# Patient Record
Sex: Female | Born: 1965 | Race: White | Hispanic: No | Smoking: Former smoker
Health system: Southern US, Community
[De-identification: ages and names within clinical notes are randomized; demographics above are authoritative.]

## PROBLEM LIST (undated history)

## (undated) ENCOUNTER — Emergency Department (HOSPITAL_COMMUNITY)

## (undated) DIAGNOSIS — E119 Type 2 diabetes mellitus without complications: Secondary | ICD-10-CM

## (undated) DIAGNOSIS — A6920 Lyme disease, unspecified: Secondary | ICD-10-CM

## (undated) DIAGNOSIS — I10 Essential (primary) hypertension: Secondary | ICD-10-CM

## (undated) DIAGNOSIS — J449 Chronic obstructive pulmonary disease, unspecified: Secondary | ICD-10-CM

## (undated) DIAGNOSIS — J45909 Unspecified asthma, uncomplicated: Secondary | ICD-10-CM

## (undated) DIAGNOSIS — E785 Hyperlipidemia, unspecified: Secondary | ICD-10-CM

## (undated) DIAGNOSIS — G510 Bell's palsy: Secondary | ICD-10-CM

## (undated) HISTORY — DX: Type 2 diabetes mellitus without complications: E11.9

## (undated) HISTORY — DX: Chronic obstructive pulmonary disease, unspecified: J44.9

---

## 2012-02-27 DIAGNOSIS — I10 Essential (primary) hypertension: Secondary | ICD-10-CM | POA: Insufficient documentation

## 2012-02-27 DIAGNOSIS — J45909 Unspecified asthma, uncomplicated: Secondary | ICD-10-CM | POA: Insufficient documentation

## 2012-02-27 DIAGNOSIS — E785 Hyperlipidemia, unspecified: Secondary | ICD-10-CM | POA: Insufficient documentation

## 2012-02-27 DIAGNOSIS — E782 Mixed hyperlipidemia: Secondary | ICD-10-CM | POA: Insufficient documentation

## 2012-02-27 DIAGNOSIS — E119 Type 2 diabetes mellitus without complications: Secondary | ICD-10-CM | POA: Insufficient documentation

## 2012-02-29 ENCOUNTER — Ambulatory Visit: Payer: Self-pay | Admitting: Internal Medicine

## 2016-01-26 ENCOUNTER — Other Ambulatory Visit: Payer: Self-pay | Admitting: Family Medicine

## 2016-01-28 NOTE — Telephone Encounter (Signed)
Number disconnected.

## 2016-02-15 ENCOUNTER — Other Ambulatory Visit: Payer: Self-pay | Admitting: Family Medicine

## 2016-02-15 NOTE — Telephone Encounter (Signed)
Tried contacting patient number disconnected.

## 2016-02-17 ENCOUNTER — Other Ambulatory Visit: Payer: Self-pay | Admitting: Family Medicine

## 2016-02-19 ENCOUNTER — Other Ambulatory Visit: Payer: Self-pay | Admitting: Family Medicine

## 2016-02-22 ENCOUNTER — Other Ambulatory Visit: Payer: Self-pay | Admitting: Family Medicine

## 2016-04-11 ENCOUNTER — Other Ambulatory Visit: Payer: Self-pay | Admitting: Family Medicine

## 2016-04-11 NOTE — Telephone Encounter (Signed)
Not our patient

## 2016-04-11 NOTE — Telephone Encounter (Signed)
Not one of our patient's, I'm not sure how to delete this entry.

## 2016-04-11 NOTE — Telephone Encounter (Signed)
Patient requesting refill. 

## 2016-05-09 ENCOUNTER — Other Ambulatory Visit: Payer: Self-pay | Admitting: Family Medicine

## 2016-05-09 NOTE — Telephone Encounter (Signed)
Patient requesting refill. 

## 2016-05-10 NOTE — Telephone Encounter (Signed)
Not a patient.

## 2017-06-11 ENCOUNTER — Other Ambulatory Visit: Payer: Self-pay | Admitting: Family Medicine

## 2017-06-11 DIAGNOSIS — N6311 Unspecified lump in the right breast, upper outer quadrant: Secondary | ICD-10-CM

## 2017-12-24 ENCOUNTER — Other Ambulatory Visit: Payer: Self-pay | Admitting: Family Medicine

## 2017-12-24 DIAGNOSIS — N6311 Unspecified lump in the right breast, upper outer quadrant: Secondary | ICD-10-CM

## 2018-03-13 ENCOUNTER — Emergency Department
Admission: EM | Admit: 2018-03-13 | Discharge: 2018-03-13 | Disposition: A | Discharge status: Home / Self Care | Payer: Medicaid Other | Attending: Student in an Organized Health Care Education/Training Program | Admitting: Student in an Organized Health Care Education/Training Program

## 2018-03-13 DIAGNOSIS — T23292A Burn of second degree of multiple sites of left wrist and hand, initial encounter: Secondary | ICD-10-CM | POA: Diagnosis not present

## 2018-03-13 DIAGNOSIS — Y999 Unspecified external cause status: Secondary | ICD-10-CM | POA: Diagnosis not present

## 2018-03-13 DIAGNOSIS — T23092A Burn of unspecified degree of multiple sites of left wrist and hand, initial encounter: Secondary | ICD-10-CM | POA: Diagnosis present

## 2018-03-13 DIAGNOSIS — E119 Type 2 diabetes mellitus without complications: Secondary | ICD-10-CM | POA: Insufficient documentation

## 2018-03-13 DIAGNOSIS — Z23 Encounter for immunization: Secondary | ICD-10-CM | POA: Insufficient documentation

## 2018-03-13 DIAGNOSIS — I10 Essential (primary) hypertension: Secondary | ICD-10-CM | POA: Diagnosis not present

## 2018-03-13 DIAGNOSIS — Y92000 Kitchen of unspecified non-institutional (private) residence as  the place of occurrence of the external cause: Secondary | ICD-10-CM | POA: Insufficient documentation

## 2018-03-13 DIAGNOSIS — Y93G3 Activity, cooking and baking: Secondary | ICD-10-CM | POA: Insufficient documentation

## 2018-03-13 DIAGNOSIS — Z7984 Long term (current) use of oral hypoglycemic drugs: Secondary | ICD-10-CM | POA: Diagnosis not present

## 2018-03-13 DIAGNOSIS — X102XXA Contact with fats and cooking oils, initial encounter: Secondary | ICD-10-CM | POA: Insufficient documentation

## 2018-03-13 DIAGNOSIS — J45909 Unspecified asthma, uncomplicated: Secondary | ICD-10-CM | POA: Diagnosis not present

## 2018-03-13 HISTORY — DX: Essential (primary) hypertension: I10

## 2018-03-13 HISTORY — DX: Unspecified asthma, uncomplicated: J45.909

## 2018-03-13 HISTORY — DX: Type 2 diabetes mellitus without complications: E11.9

## 2018-03-13 HISTORY — DX: Hyperlipidemia, unspecified: E78.5

## 2018-03-13 MED ORDER — HYDROCODONE-ACETAMINOPHEN 5-325 MG PO TABS
1.0000 | ORAL_TABLET | Freq: Once | ORAL | Status: AC
  Administered 2018-03-13: 1 via ORAL
  Filled 2018-03-13: qty 1

## 2018-03-13 MED ORDER — BACITRACIN ZINC 500 UNIT/GM EX OINT
TOPICAL_OINTMENT | CUTANEOUS | Status: AC
  Filled 2018-03-13: qty 3.6

## 2018-03-13 MED ORDER — BACITRACIN ZINC 500 UNIT/GM EX OINT
TOPICAL_OINTMENT | Freq: Once | CUTANEOUS | Status: AC
  Administered 2018-03-13: 2 via TOPICAL
  Filled 2018-03-13: qty 0.9

## 2018-03-13 MED ORDER — MORPHINE SULFATE (PF) 4 MG/ML IV SOLN: 4.0000 mg | INTRAVENOUS | Status: DC | PRN

## 2018-03-13 MED ORDER — HYDROCODONE-ACETAMINOPHEN 5-325 MG PO TABS: 1.0000 | ORAL_TABLET | ORAL | 0 refills | Status: DC | PRN

## 2018-03-13 MED ORDER — BACITRACIN ZINC 500 UNIT/GM EX OINT
TOPICAL_OINTMENT | Freq: Once | CUTANEOUS | Status: AC
  Administered 2018-03-13: 1 via TOPICAL
  Filled 2018-03-13: qty 0.9

## 2018-03-13 MED ORDER — TETANUS-DIPHTH-ACELL PERTUSSIS 5-2.5-18.5 LF-MCG/0.5 IM SUSP
0.5000 mL | Freq: Once | INTRAMUSCULAR | Status: AC
  Administered 2018-03-13: 0.5 mL via INTRAMUSCULAR
  Filled 2018-03-13: qty 0.5

## 2018-03-13 MED ORDER — ONDANSETRON HCL 4 MG/2ML IJ SOLN
4.0000 mg | Freq: Once | INTRAMUSCULAR | Status: DC
Start: 2018-03-13 — End: 2018-03-14

## 2018-03-13 MED ORDER — BACITRACIN ZINC 500 UNIT/GM EX OINT
TOPICAL_OINTMENT | Freq: Once | CUTANEOUS | Status: AC
Start: 2018-03-13 — End: 2018-03-13
  Administered 2018-03-13: 1 via TOPICAL
  Filled 2018-03-13: qty 0.9
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
## 2018-03-13 NOTE — ED Notes (Signed)
Called [facility] to inform them per Dr. Merlyn Lot patient did not want to be transferred to [facility] ED  2305
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
## 2018-03-14 DIAGNOSIS — T23292A Burn of second degree of multiple sites of left wrist and hand, initial encounter: Secondary | ICD-10-CM | POA: Insufficient documentation

## 2018-06-19 ENCOUNTER — Emergency Department (HOSPITAL_COMMUNITY)
Admission: EM | Admit: 2018-06-19 | Discharge: 2018-06-19 | Disposition: A | Discharge status: Home / Self Care | Payer: Medicaid Other | Attending: Emergency Medicine | Admitting: Emergency Medicine

## 2018-06-19 ENCOUNTER — Other Ambulatory Visit: Payer: Self-pay

## 2018-06-19 ENCOUNTER — Encounter (HOSPITAL_COMMUNITY): Payer: Self-pay | Admitting: Emergency Medicine

## 2018-06-19 ENCOUNTER — Emergency Department (HOSPITAL_COMMUNITY): Payer: Medicaid Other

## 2018-06-19 DIAGNOSIS — L0291 Cutaneous abscess, unspecified: Secondary | ICD-10-CM

## 2018-06-19 DIAGNOSIS — E1165 Type 2 diabetes mellitus with hyperglycemia: Secondary | ICD-10-CM | POA: Diagnosis not present

## 2018-06-19 DIAGNOSIS — Z794 Long term (current) use of insulin: Secondary | ICD-10-CM | POA: Diagnosis not present

## 2018-06-19 DIAGNOSIS — I10 Essential (primary) hypertension: Secondary | ICD-10-CM | POA: Insufficient documentation

## 2018-06-19 DIAGNOSIS — J45909 Unspecified asthma, uncomplicated: Secondary | ICD-10-CM | POA: Diagnosis not present

## 2018-06-19 DIAGNOSIS — R739 Hyperglycemia, unspecified: Secondary | ICD-10-CM

## 2018-06-19 DIAGNOSIS — F17210 Nicotine dependence, cigarettes, uncomplicated: Secondary | ICD-10-CM | POA: Diagnosis not present

## 2018-06-19 DIAGNOSIS — Z79899 Other long term (current) drug therapy: Secondary | ICD-10-CM | POA: Insufficient documentation

## 2018-06-19 DIAGNOSIS — N611 Abscess of the breast and nipple: Secondary | ICD-10-CM

## 2018-06-19 LAB — BASIC METABOLIC PANEL
Anion gap: 9 (ref 5–15)
BUN: 14 mg/dL (ref 6–20)
CO2: 25 mmol/L (ref 22–32)
CREATININE: 0.69 mg/dL (ref 0.44–1.00)
Calcium: 9.3 mg/dL (ref 8.9–10.3)
Chloride: 95 mmol/L — ABNORMAL LOW (ref 98–111)
GFR calc non Af Amer: >60 mL/min (ref >60)
GLUCOSE: 580 mg/dL — AB (ref 70–99)
Potassium: 4.4 mmol/L (ref 3.5–5.1)
Sodium: 129 mmol/L — ABNORMAL LOW (ref 135–145)

## 2018-06-19 LAB — CBC WITH DIFFERENTIAL/PLATELET
BASOS PCT: 0 %
Basophils Absolute: 0 10*3/uL (ref 0.0–0.1)
Eosinophils Absolute: 0.1 10*3/uL (ref 0.0–0.7)
Eosinophils Relative: 2 %
HEMATOCRIT: 50 % — AB (ref 36.0–46.0)
HEMOGLOBIN: 16.8 g/dL — AB (ref 12.0–15.0)
LYMPHS ABS: 1.2 10*3/uL (ref 0.7–4.0)
LYMPHS PCT: 17 %
MCH: 28.4 pg (ref 26.0–34.0)
MCHC: 33.6 g/dL (ref 30.0–36.0)
MCV: 84.6 fL (ref 78.0–100.0)
MONO ABS: 0.6 10*3/uL (ref 0.1–1.0)
MONOS PCT: 8 %
NEUTROS ABS: 4.9 10*3/uL (ref 1.7–7.7)
NEUTROS PCT: 73 %
Platelets: 159 10*3/uL (ref 150–400)
RBC: 5.91 MIL/uL — ABNORMAL HIGH (ref 3.87–5.11)
RDW: 14.6 % (ref 11.5–15.5)
WBC: 6.8 10*3/uL (ref 4.0–10.5)

## 2018-06-19 LAB — CBG MONITORING, ED: GLUCOSE-CAPILLARY: 432 mg/dL — AB (ref 70–99)

## 2018-06-19 MED ORDER — HYDROCODONE-ACETAMINOPHEN 5-325 MG PO TABS: ORAL_TABLET | ORAL | 0 refills | Status: DC

## 2018-06-19 MED ORDER — SULFAMETHOXAZOLE-TRIMETHOPRIM 800-160 MG PO TABS: 1.0000 | ORAL_TABLET | Freq: Two times a day (BID) | ORAL | 0 refills | Status: AC

## 2018-06-19 MED ORDER — INSULIN ASPART 100 UNIT/ML ~~LOC~~ SOLN
10.0000 [IU] | Freq: Once | SUBCUTANEOUS | Status: AC
  Administered 2018-06-19: 10 [IU] via SUBCUTANEOUS
  Filled 2018-06-19: qty 1

## 2018-06-19 MED ORDER — SODIUM CHLORIDE 0.9 % IV BOLUS
1000.0000 mL | Freq: Once | INTRAVENOUS | Status: AC
Start: 2018-06-19 — End: 2018-06-19
  Administered 2018-06-19: 1000 mL via INTRAVENOUS

## 2018-06-19 MED ORDER — HYDROCODONE-ACETAMINOPHEN 5-325 MG PO TABS
1.0000 | ORAL_TABLET | Freq: Once | ORAL | Status: AC
  Administered 2018-06-19: 1 via ORAL
  Filled 2018-06-19: qty 1

## 2018-06-19 NOTE — ED Provider Notes (Signed)
[facility] EMERGENCY DEPARTMENT Provider Note   CSN: [id number] Arrival date & time: 06/19/18  0944     History   Chief Complaint Chief Complaint  Patient presents with  . Abscess    HPI Christine Weaver is a 52 y.o. female.  HPI   Christine Weaver is a 52 y.o. female with past medical history of diabetes, hypertension who presents to the Emergency Department complaining of pain, redness, and swelling of the right breast.  Symptoms have been worsening for 3 days.  She describes constant throbbing of her breast with severe pain upon palpation.  She denies injury, nipple discharge, fever, chills.  No known family history of breast cancer.  States that she had a mammogram some years ago, but her medical records were lost.  Patient is a smoker.  Past Medical History:  Diagnosis Date  . Asthma   . Diabetes mellitus without complication ([place])   . Hyperlipidemia   . Hypertension     There are no active problems to display for this patient.   Past Surgical History:  Procedure Laterality Date  . CESAREAN SECTION       OB History   None      Home Medications    Prior to Admission medications   Medication Sig Start Date End Date Taking? Authorizing Provider  albuterol (PROVENTIL HFA;VENTOLIN HFA) 108 (90 Base) MCG/ACT inhaler Inhale into the lungs every 4 (four) hours as needed for wheezing.  03/18/12  Yes [provider]  albuterol (PROVENTIL) (2.5 MG/3ML) 0.083% nebulizer solution Inhale contents fo 1 vial in nebulizer every 4 hours if needed 10/25/16  Yes [provider]  amLODipine (NORVASC) 10 MG tablet Take 10 mg by mouth at bedtime.  05/31/17  Yes [provider]  atorvastatin (LIPITOR) 40 MG tablet take 1 tablet by mouth every evening for cholesterol 05/31/17  Yes [provider]  fluticasone-salmeterol (ADVAIR HFA) 115-21 MCG/ACT inhaler Inhale 2 puffs into the lungs 2 (two) times daily as needed (wheezing).  03/18/12  Yes [provider]  hydrOXYzine (ATARAX/VISTARIL) 25 MG tablet Take 25 mg by mouth every 4 (four) hours as needed for anxiety.  11/03/17  Yes [provider]  ibuprofen (ADVIL,MOTRIN) 800 MG tablet Take 800 mg by mouth 3 (three) times daily.  03/18/12  Yes [provider]  insulin aspart (NOVOLOG FLEXPEN) 100 UNIT/ML FlexPen As directed up to 60 units per day 05/31/17  Yes [provider]  Insulin Glargine (LANTUS SOLOSTAR) 100 UNIT/ML Solostar Pen Inject 10-20 Units into the skin daily. Inject 10 units in the morning and 10 units in the afternoon and 20 units at bedtime 05/31/17  Yes [provider]  liraglutide (VICTOZA) 18 MG/3ML SOPN Inject 1.8 mg into the skin daily.  05/31/17  Yes [provider]  lisinopril (PRINIVIL,ZESTRIL) 10 MG tablet take 1 tablet by mouth once daily for blood pressure 05/31/17  Yes [provider]  metFORMIN (GLUCOPHAGE) 1000 MG tablet Take 1,000 mg by mouth 2 (two) times daily with a meal.  10/27/12  Yes [provider]  omeprazole (PRILOSEC) 40 MG capsule Take by mouth. 03/18/12  Yes [provider]  SSD 1 % cream Apply 1 application topically daily. For rash 04/10/18  Yes [provider]  HYDROcodone-acetaminophen (NORCO) 5-325 MG tablet Take 1 tablet by mouth every 4 (four) hours as needed for moderate pain. Patient not taking: Reported on 06/19/2018 03/13/18   Merlyn Lot, MD    Family History History reviewed. No  pertinent family history.  Social History Social History   Tobacco Use  . Smoking status: Current Every Day Smoker  . Smokeless tobacco: Never Used  Substance Use Topics  . Alcohol use: Not on file  . Drug use: Not on file     Allergies   Morphine and related and Percocet [oxycodone-acetaminophen]   Review of Systems Review of Systems  Constitutional: Negative for chills and fever.  Respiratory: Negative for chest tightness and shortness of breath.   Cardiovascular:  Negative for leg swelling.  Gastrointestinal: Negative for abdominal pain, nausea and vomiting.  Genitourinary: Negative for vaginal bleeding and vaginal discharge.       Right breast pain, redness, and swelling  Musculoskeletal: Negative for arthralgias, joint swelling and neck pain.  Skin: Positive for color change.  Neurological: Negative for weakness, numbness and headaches.  Hematological: Negative for adenopathy.  All other systems reviewed and are negative.    Physical Exam Updated Vital Signs BP (!) 152/96 (BP Location: Right Arm)   Pulse 83   Temp 98.3 F (36.8 C) (Oral)   Resp 19   SpO2 96%   Physical Exam  Constitutional: She appears well-developed and well-nourished. No distress.  HENT:  Head: Atraumatic.  Mouth/Throat: Oropharynx is clear and moist.  Neck: Normal range of motion.  Cardiovascular: Normal rate, regular rhythm and intact distal pulses.  Pulmonary/Chest: Effort normal and breath sounds normal. No respiratory distress. Right breast exhibits inverted nipple, skin change and tenderness. Right breast exhibits no nipple discharge. There is breast swelling. No breast discharge or bleeding.  Significant induration, erythema, and tenderness of the right breast.  Symptoms involve the areola and extending laterally to the 9 o'clock position.  Right nipple is inverted.  No discharge.  Musculoskeletal: Normal range of motion.  Lymphadenopathy:    She has no cervical adenopathy.  Neurological: She is alert. No sensory deficit.  Skin: Skin is warm. Capillary refill takes less than 2 seconds.  Nursing note and vitals reviewed.    ED Treatments / Results  Labs (all labs ordered are listed, but only abnormal results are displayed) Labs Reviewed  CBC WITH DIFFERENTIAL/PLATELET - Abnormal; Notable for the following components:      Result Value   RBC 5.91 (*)    Hemoglobin 16.8 (*)    HCT 50.0 (*)    All other components within normal limits  BASIC METABOLIC  PANEL - Abnormal; Notable for the following components:   Sodium 129 (*)    Chloride 95 (*)    Glucose, Bld 580 (*)    All other components within normal limits  CBG MONITORING, ED - Abnormal; Notable for the following components:   Glucose-Capillary 432 (*)    All other components within normal limits  CULTURE, BLOOD (ROUTINE X 2)  CULTURE, BLOOD (ROUTINE X 2)    EKG None  Radiology US Breast Ltd Uni Right Inc Axilla  Result Date: 06/19/2018 CLINICAL DATA:  Right breast erythema and swelling 3 days with tenderness and area of firmness round nipple. Exam was performed on an emergent basis through the ER by the ultrasound technologist without radiologist present. EXAM: ULTRASOUND OF THE RIGHT BREAST COMPARISON:  Previous exam(s). FINDINGS: Targeted ultrasound is performed in the area of patient's erythema and swelling, showing a large hypoechoic heterogeneous probable complex fluid collection within increased through transmission at the 9 o'clock position of the right breast 3 cm from the nipple. This measures approximately 3.7 x 6.7 x 6.7 cm and likely represents an abscess.  There is overlying skin thickening. IMPRESSION: Heterogeneous hypoechoic mass with through transmission over the 9 o'clock position of the right breast 3 cm from the nipple measuring 3.7 x 6.7 x 6.7 cm. In keeping with patient's clinical presentation, this likely represents an abscess. RECOMMENDATION: Recommend immediate aspiration with 10 day course of antibiotics with doxycycline. Recommend follow-up ultrasound 1 week. Also recommend mammographic correlation when feasible. I have discussed the findings and recommendations with the patient. Results were also provided in writing at the conclusion of the visit. If applicable, a reminder letter will be sent to the patient regarding the next appointment. BI-RADS CATEGORY  3: Probably benign. Electronically Signed   By: Marin Olp M.D.   On: 06/19/2018 12:33     Procedures Procedures (including critical care time)  Medications Ordered in ED Medications  HYDROcodone-acetaminophen (NORCO/VICODIN) 5-325 MG per tablet 1 tablet (1 tablet Oral Given 06/19/18 1120)  sodium chloride 0.9 % bolus 1,000 mL (1,000 mLs Intravenous New Bag/Given 06/19/18 1227)  insulin aspart (novoLOG) injection 10 Units (10 Units Subcutaneous Given 06/19/18 1227)     Initial Impression / Assessment and Plan / ED Course  I have reviewed the triage vital signs and the nursing notes.  Pertinent labs & imaging results that were available during my care of the patient were reviewed by me and considered in my medical decision making (see chart for details).    Patient is nontoxic-appearing.  CBG of 580.  Patient states she did not take her diabetes medication prior to arriving.  Her clinical symptoms of DKA.  Anion gap is within normal limits.  Will administer insulin and IV fluids.  Discussed ultrasound results with patient.  She states that she is ready for discharge home.   Consulted general surgery, Dr. Arnoldo Morale and discussed findings. He  Agrees to see pt in his office, recommends Bactrim.    Patient agrees to plan, will take medication for her diabetes when she arrives home.  Return precautions discussed.  Final Clinical Impressions(s) / ED Diagnoses   Final diagnoses:  Abscess  Abscess of right breast  Hyperglycemia    ED Discharge Orders    None       Kem Parkinson, PA-C 06/21/18 1744    Fredia Sorrow, MD 06/30/18 [phone]

## 2018-06-19 NOTE — Discharge Instructions (Addendum)
Take the antibiotic as directed until finished.  Apply warm wet compresses on and off to your breast.  Call Dr. Arnoldo Morale office tomorrow to arrange a follow-up appointment.  Be sure to take your diabetes medications daily as directed and avoid sweets.

## 2018-06-19 NOTE — ED Triage Notes (Signed)
Pt noticed redness and swelling to right breast 3 days ago. Over half of breast around nippled is hard, red and swollen. Very tender to touch. Denies dc from nipple. Denies fever/chills

## 2018-06-19 NOTE — ED Notes (Signed)
CRITICAL VALUE ALERT  Critical Value:  Glucose 580  Date & Time Notied:  1141, 06/19/18  Provider Notified: Kem Parkinson, PA  Orders Received/Actions taken: no new orders at this time

## 2018-06-19 NOTE — ED Notes (Signed)
Pt still in ultrasound.

## 2018-06-24 LAB — CULTURE, BLOOD (ROUTINE X 2)
Culture: NO GROWTH
Culture: NO GROWTH
SPECIAL REQUESTS: ADEQUATE
SPECIAL REQUESTS: ADEQUATE

## 2019-09-23 LAB — BASIC METABOLIC PANEL
BUN: 15 (ref 4–21)
Creatinine: 0.7 (ref 0.5–1.1)

## 2019-09-23 LAB — LIPID PANEL
Cholesterol: 206 — AB (ref 0–200)
HDL: 44 (ref 35–70)
LDL Cholesterol: 108
Triglycerides: 269 — AB (ref 40–160)

## 2019-09-23 LAB — TSH: TSH: 1.42 (ref 0.41–5.90)

## 2019-09-23 LAB — HEMOGLOBIN A1C: Hemoglobin A1C: 11.2

## 2019-12-15 ENCOUNTER — Encounter: Payer: Self-pay | Admitting: "Endocrinology

## 2019-12-15 ENCOUNTER — Ambulatory Visit (INDEPENDENT_AMBULATORY_CARE_PROVIDER_SITE_OTHER): Payer: Medicaid Other | Admitting: "Endocrinology

## 2019-12-15 ENCOUNTER — Other Ambulatory Visit: Payer: Self-pay

## 2019-12-15 VITALS — BP 168/95 | HR 92 | Ht 62.0 in | Wt 220.6 lb

## 2019-12-15 DIAGNOSIS — I10 Essential (primary) hypertension: Secondary | ICD-10-CM

## 2019-12-15 DIAGNOSIS — Z794 Long term (current) use of insulin: Secondary | ICD-10-CM

## 2019-12-15 DIAGNOSIS — F17200 Nicotine dependence, unspecified, uncomplicated: Secondary | ICD-10-CM

## 2019-12-15 DIAGNOSIS — E1165 Type 2 diabetes mellitus with hyperglycemia: Secondary | ICD-10-CM

## 2019-12-15 DIAGNOSIS — E782 Mixed hyperlipidemia: Secondary | ICD-10-CM | POA: Diagnosis not present

## 2019-12-15 LAB — POCT GLYCOSYLATED HEMOGLOBIN (HGB A1C): Hemoglobin A1C: 10.3 % — AB (ref 4.0–5.6)

## 2019-12-15 NOTE — Progress Notes (Signed)
Endocrinology Consult Note       12/15/2019, 11:09 AM   Subjective:    Patient ID: Christine Weaver, female    DOB: 12/17/1965.  Charlott Dhanraj is being seen in consultation for management of currently uncontrolled symptomatic diabetes requested by  Martin Majestic, FNP.   Past Medical History:  Diagnosis Date  . Asthma   . Diabetes mellitus without complication ([place])   . Hyperlipidemia   . Hypertension     Past Surgical History:  Procedure Laterality Date  . CESAREAN SECTION      Social History   Socioeconomic History  . Marital status: Married    Spouse name: Not on file  . Number of children: Not on file  . Years of education: Not on file  . Highest education level: Not on file  Occupational History  . Not on file  Tobacco Use  . Smoking status: Current Every Day Smoker  . Smokeless tobacco: Never Used  Substance and Sexual Activity  . Alcohol use: Not on file  . Drug use: Not on file  . Sexual activity: Not on file  Other Topics Concern  . Not on file  Social History Narrative  . Not on file   Social Determinants of Health   Financial Resource Strain:   . Difficulty of Paying Living Expenses: Not on file  Food Insecurity:   . Worried About Charity fundraiser in the Last Year: Not on file  . Ran Out of Food in the Last Year: Not on file  Transportation Needs:   . Lack of Transportation (Medical): Not on file  . Lack of Transportation (Non-Medical): Not on file  Physical Activity:   . Days of Exercise per Week: Not on file  . Minutes of Exercise per Session: Not on file  Stress:   . Feeling of Stress : Not on file  Social Connections:   . Frequency of Communication with Friends and Family: Not on file  . Frequency of Social Gatherings with Friends and Family: Not on file  . Attends Religious Services: Not on file  . Active Member of Clubs or Organizations: Not on file   . Attends Archivist Meetings: Not on file  . Marital Status: Not on file    Family History  Problem Relation Age of Onset  . Thyroid disease Mother   . Hypertension Mother   . Heart attack Mother   . Stroke Mother   . Heart failure Mother   . Diabetes Mother   . Diabetes Father   . Heart attack Father   . Stroke Father   . Kidney disease Father   . Heart failure Father     Outpatient Encounter Medications as of 12/15/2019  Medication Sig  . atorvastatin (LIPITOR) 80 MG tablet Take 80 mg by mouth daily.  Marland Kitchen glucose blood (ACCU-CHEK AVIVA PLUS) test strip TEST BLOOD SUGAR ONE TO TWO TIMES A DAY as directed  . insulin aspart (NOVOLOG) 100 UNIT/ML injection Inject 10-16 Units into the skin 3 (three) times daily before meals.    . insulin glargine (LANTUS) 100 UNIT/ML injection Inject 80 Units into  the skin at bedtime.  . Insulin Pen Needle (BD PEN NEEDLE NANO U/F) 32G X 4 MM MISC ok to sub any brand or size needle preferred by insurance/patient, use up to 5x/day, dx=associated dx  . lisinopril (ZESTRIL) 20 MG tablet Take 20 mg by mouth daily.  Marland Kitchen albuterol (PROVENTIL HFA;VENTOLIN HFA) 108 (90 Base) MCG/ACT inhaler Inhale into the lungs every 4 (four) hours as needed for wheezing.   Marland Kitchen albuterol (PROVENTIL) (2.5 MG/3ML) 0.083% nebulizer solution Inhale contents fo 1 vial in nebulizer every 4 hours if needed  . fluticasone-salmeterol (ADVAIR HFA) 115-21 MCG/ACT inhaler Inhale 2 puffs into the lungs 2 (two) times daily as needed (wheezing).   Marland Kitchen HYDROcodone-acetaminophen (NORCO/VICODIN) 5-325 MG tablet Take one tab po q 4 hrs prn pain  . hydrOXYzine (ATARAX/VISTARIL) 25 MG tablet Take 25 mg by mouth every 4 (four) hours as needed for anxiety.   Marland Kitchen ibuprofen (ADVIL,MOTRIN) 800 MG tablet Take 800 mg by mouth 3 (three) times daily.   Marland Kitchen liraglutide (VICTOZA) 18 MG/3ML SOPN Inject 1.8 mg into the skin daily.   . metFORMIN (GLUCOPHAGE) 1000 MG tablet Take 1,000 mg by mouth 2 (two) times  daily with a meal.   . omeprazole (PRILOSEC) 40 MG capsule Take by mouth.  . SSD 1 % cream Apply 1 application topically daily. For rash  . [DISCONTINUED] amLODipine (NORVASC) 10 MG tablet Take 10 mg by mouth at bedtime.   . [DISCONTINUED] atorvastatin (LIPITOR) 40 MG tablet take 1 tablet by mouth every evening for cholesterol  . [DISCONTINUED] insulin aspart (NOVOLOG FLEXPEN) 100 UNIT/ML FlexPen As directed up to 60 units per day  . [DISCONTINUED] Insulin Glargine (LANTUS SOLOSTAR) 100 UNIT/ML Solostar Pen Inject 10-20 Units into the skin daily. Inject 10 units in the morning and 10 units in the afternoon and 20 units at bedtime  . [DISCONTINUED] lisinopril (PRINIVIL,ZESTRIL) 10 MG tablet take 1 tablet by mouth once daily for blood pressure   No facility-administered encounter medications on file as of 12/15/2019.    ALLERGIES: Allergies  Allergen Reactions  . Morphine And Related   . Percocet [Oxycodone-Acetaminophen]     VACCINATION STATUS: Immunization History  Administered Date(s) Administered  . Tdap 03/13/2018    Diabetes She presents for her initial diabetic visit. She has type 2 diabetes mellitus. Onset time: She was diagnosed at approximately age of 43 years. Her disease course has been fluctuating. There are no hypoglycemic associated symptoms. Pertinent negatives for hypoglycemia include no confusion, headaches, pallor or seizures. Associated symptoms include fatigue, polydipsia and polyuria. Pertinent negatives for diabetes include no chest pain and no polyphagia. There are no hypoglycemic complications. Symptoms are worsening. Risk factors for coronary artery disease include dyslipidemia, diabetes mellitus, hypertension, obesity, sedentary lifestyle and tobacco exposure. Current diabetic treatment includes insulin injections. Her weight is fluctuating minimally. She is following a generally unhealthy diet. When asked about meal planning, she reported none. She has not had a  previous visit with a dietitian. She never participates in exercise. (She did not bring any logs nor meter to review today.  Her point-of-care A1c was found to be 10.3%.) An ACE inhibitor/angiotensin II receptor blocker is being taken. Eye exam is current.  Hyperlipidemia This is a chronic problem. The problem is uncontrolled. Recent lipid tests were reviewed and are high. Exacerbating diseases include diabetes and obesity. Pertinent negatives include no chest pain, myalgias or shortness of breath. Current antihyperlipidemic treatment includes statins. Risk factors for coronary artery disease include dyslipidemia, diabetes mellitus, family history,  hypertension, obesity and a sedentary lifestyle.  Hypertension This is a chronic problem. The current episode started more than 1 year ago. The problem is uncontrolled. Pertinent negatives include no chest pain, headaches, palpitations or shortness of breath. Risk factors for coronary artery disease include diabetes mellitus, dyslipidemia, obesity, sedentary lifestyle, smoking/tobacco exposure and family history. Past treatments include ACE inhibitors.     Review of Systems  Constitutional: Positive for fatigue. Negative for chills, fever and unexpected weight change.  HENT: Negative for trouble swallowing and voice change.   Eyes: Negative for visual disturbance.  Respiratory: Negative for cough, shortness of breath and wheezing.   Cardiovascular: Negative for chest pain, palpitations and leg swelling.  Gastrointestinal: Negative for diarrhea, nausea and vomiting.  Endocrine: Positive for polydipsia and polyuria. Negative for cold intolerance, heat intolerance and polyphagia.  Musculoskeletal: Negative for arthralgias and myalgias.  Skin: Negative for color change, pallor, rash and wound.  Neurological: Negative for seizures and headaches.  Psychiatric/Behavioral: Negative for confusion and suicidal ideas.    Objective:    Vitals with BMI  12/15/2019 06/19/2018 06/19/2018  Height 5\' 2"  - -  Weight 220 lbs 10 oz - -  BMI 123456 - -  Systolic [id number]  Diastolic 95 96 85  Pulse 92 83 91    BP (!) 168/95   Pulse 92   Ht 5\' 2"  (1.575 m)   Wt 220 lb 9.6 oz (100.1 kg)   BMI 40.35 kg/m   Wt Readings from Last 3 Encounters:  12/15/19 220 lb 9.6 oz (100.1 kg)  03/13/18 225 lb (102.1 kg)     Physical Exam Constitutional:      Appearance: She is well-developed.  HENT:     Head: Normocephalic and atraumatic.  Neck:     Thyroid: No thyromegaly.     Trachea: No tracheal deviation.  Cardiovascular:     Rate and Rhythm: Normal rate and regular rhythm.  Pulmonary:     Effort: Pulmonary effort is normal.  Abdominal:     Tenderness: There is no abdominal tenderness. There is no guarding.  Musculoskeletal:        General: Normal range of motion.     Cervical back: Normal range of motion and neck supple.  Skin:    General: Skin is warm and dry.     Coloration: Skin is not pale.     Findings: No erythema or rash.  Neurological:     Mental Status: She is alert and oriented to person, place, and time.     Cranial Nerves: No cranial nerve deficit.     Coordination: Coordination normal.     Deep Tendon Reflexes: Reflexes are normal and symmetric.  Psychiatric:        Judgment: Judgment normal.       CMP ( most recent) CMP     Component Value Date/Time   NA 129 (L) 06/19/2018 1109   K 4.4 06/19/2018 1109   CL 95 (L) 06/19/2018 1109   CO2 25 06/19/2018 1109   GLUCOSE 580 (HH) 06/19/2018 1109   BUN 15 09/23/2019 0000   CREATININE 0.7 09/23/2019 0000   CREATININE 0.69 06/19/2018 1109   CALCIUM 9.3 06/19/2018 1109   GFRNONAA >60 06/19/2018 1109   GFRAA >60 06/19/2018 1109     Diabetic Labs (most recent): Lab Results  Component Value Date   HGBA1C 10.3 (A) 12/15/2019   HGBA1C 11.2 09/23/2019     Lipid Panel ( most recent) Lipid Panel  Component Value Date/Time   CHOL 206 (A) 09/23/2019 0000   TRIG  269 (A) 09/23/2019 0000   HDL 44 09/23/2019 0000   LDLCALC 108 09/23/2019 0000      Lab Results  Component Value Date   TSH 1.42 09/23/2019           Assessment & Plan:   1. Type 2 diabetes mellitus with hyperglycemia, with long-term current use of insulin (HCC)  - Francis has currently uncontrolled symptomatic type 2 DM since  54 years of age,  with most recent A1c of 10.3 %. Recent labs reviewed. - I had a long discussion with her about the progressive nature of diabetes and the pathology behind its complications. -her diabetes is complicated by obesity/sedentary life, smoking and she remains at a high risk for more acute and chronic complications which include CAD, CVA, CKD, retinopathy, and neuropathy. These are all discussed in detail with her.  - I have counseled her on diet  and weight management  by adopting a carbohydrate restricted/protein rich diet. Patient is encouraged to switch to  unprocessed or minimally processed     complex starch and increased protein intake (animal or plant source), fruits, and vegetables. -  she is advised to stick to a routine mealtimes to eat 3 meals  a day and avoid unnecessary snacks ( to snack only to correct hypoglycemia).   - she admits that there is a room for improvement in her food and drink choices. - Suggestion is made for her to avoid simple carbohydrates  from her diet including Cakes, Sweet Desserts, Ice Cream, Soda (diet and regular), Sweet Tea, Candies, Chips, Cookies, Store Bought Juices, Alcohol in Excess of  1-2 drinks a day, Artificial Sweeteners,  Coffee Creamer, and "Sugar-free" Products. This will help patient to have more stable blood glucose profile and potentially avoid unintended weight gain.  - she will be scheduled with Jearld Fenton, RDN, CDE for diabetes education.  - I have approached her with the following individualized plan to manage  her diabetes and patient agrees:   -Given her current and prevailing  glycemic burden, she will continue to need intensive treatment with basal/bolus insulin in order for her to achieve and maintain control of diabetes to target. -Accordingly, I advised her to adjust her Lantus to 80 units daily at bedtime , and prandial insulin NovoLog 10 units 3 times a day with meals  for pre-meal BG readings of 90-150mg /dl, plus patient specific correction dose for unexpected hyperglycemia above 150mg /dl, associated with strict monitoring of glucose 4 times a day-before meals and at bedtime. - she is warned not to take insulin without proper monitoring per orders. - Adjustment parameters are given to her for hypo and hyperglycemia in writing. - she is encouraged to call clinic for blood glucose levels less than 70 or above 300 mg /dl. - she is advised to continue Metformin 1000 mg p.o. twice daily, Victoza 1.8 mg subcutaneously daily, therapeutically suitable for patient .  - Specific targets for  A1c;  LDL, HDL,  and Triglycerides were discussed with the patient.  2) Blood Pressure /Hypertension:  her blood pressure is not controlled to target.   she is advised to continue her current medications including lisinopril 20 mg p.o. daily with breakfast . 3) Lipids/Hyperlipidemia:   Review of her recent lipid panel showed un controlled  LDL at 108 .  she  is advised to continue    atorvastatin 80 mg daily at bedtime.  Side effects  and precautions discussed with her.  4)  Weight/Diet:  Body mass index is 40.35 kg/m.  -   clearly complicating her diabetes care.   she is  a candidate for weight loss. I discussed with her the fact that loss of 5 - 10% of her  current body weight will have the most impact on her diabetes management.  Exercise, and detailed carbohydrates information provided  -  detailed on discharge instructions.  5) Chronic Care/Health Maintenance:  -she  is on ACEI/ARB and Statin medications and  is encouraged to initiate and continue to follow up with Ophthalmology,  Dentist,  Podiatrist at least yearly or according to recommendations, and advised to  quit smoking. I have recommended yearly flu vaccine and pneumonia vaccine at least every 5 years; moderate intensity exercise for up to 150 minutes weekly; and  sleep for at least 7 hours a day.  - she is  advised to maintain close follow up with Martin Majestic, FNP for primary care needs, as well as her other providers for optimal and coordinated care.   - Time spent in this patient care: 60 min, of which > 50% was spent in  counseling  her about her currently uncontrolled type 2 diabetes, hyperlipidemia, hypertension and the rest reviewing her blood glucose logs , discussing her hypoglycemia and hyperglycemia episodes, reviewing her current and  previous labs / studies  ( including abstraction from other facilities) and medications  doses and developing a  long term treatment plan based on the latest standards of care/ guidelines; and documenting her care.    Please refer to Patient Instructions for Blood Glucose Monitoring and Insulin/Medications Dosing Guide"  in media tab for additional information. Please  also refer to " Patient Self Inventory" in the Media  tab for reviewed elements of pertinent patient history.  Ketura Kirstein participated in the discussions, expressed understanding, and voiced agreement with the above plans.  All questions were answered to her satisfaction. she is encouraged to contact clinic should she have any questions or concerns prior to her return visit.   Follow up plan: - Return in about 10 days (around 12/25/2019), or phone, for Follow up with Meter and Logs Only - no Labs.  Glade Lloyd, MD [facility] [address] [place], [place] [address] Phone: [phone]  Fax: [phone]    12/15/2019, 11:09 AM  This note was partially dictated with voice recognition software. Similar sounding words can be transcribed  inadequately or may not  be corrected upon review.

## 2019-12-15 NOTE — Patient Instructions (Signed)

## 2019-12-26 ENCOUNTER — Ambulatory Visit (INDEPENDENT_AMBULATORY_CARE_PROVIDER_SITE_OTHER): Payer: Medicaid Other | Admitting: "Endocrinology

## 2019-12-26 ENCOUNTER — Encounter: Payer: Self-pay | Admitting: "Endocrinology

## 2019-12-26 DIAGNOSIS — E1165 Type 2 diabetes mellitus with hyperglycemia: Secondary | ICD-10-CM

## 2019-12-26 DIAGNOSIS — I10 Essential (primary) hypertension: Secondary | ICD-10-CM | POA: Diagnosis not present

## 2019-12-26 DIAGNOSIS — Z794 Long term (current) use of insulin: Secondary | ICD-10-CM

## 2019-12-26 DIAGNOSIS — E782 Mixed hyperlipidemia: Secondary | ICD-10-CM

## 2019-12-26 NOTE — Patient Instructions (Signed)

## 2019-12-26 NOTE — Progress Notes (Signed)
Endocrinology Consult Note       12/26/2019, 1:16 PM   Subjective:    Patient ID: Christine Weaver, female    DOB: [birth year].  Christine Weaver is being seen in consultation for management of currently uncontrolled symptomatic diabetes requested by  Martin Majestic, FNP.   Past Medical History:  Diagnosis Date  . Asthma   . Diabetes mellitus without complication ([place])   . Hyperlipidemia   . Hypertension     Past Surgical History:  Procedure Laterality Date  . CESAREAN SECTION      Social History   Socioeconomic History  . Marital status: Married    Spouse name: Not on file  . Number of children: Not on file  . Years of education: Not on file  . Highest education level: Not on file  Occupational History  . Not on file  Tobacco Use  . Smoking status: Current Every Day Smoker  . Smokeless tobacco: Never Used  Substance and Sexual Activity  . Alcohol use: Not on file  . Drug use: Not on file  . Sexual activity: Not on file  Other Topics Concern  . Not on file  Social History Narrative  . Not on file   Social Determinants of Health   Financial Resource Strain:   . Difficulty of Paying Living Expenses: Not on file  Food Insecurity:   . Worried About Charity fundraiser in the Last Year: Not on file  . Ran Out of Food in the Last Year: Not on file  Transportation Needs:   . Lack of Transportation (Medical): Not on file  . Lack of Transportation (Non-Medical): Not on file  Physical Activity:   . Days of Exercise per Week: Not on file  . Minutes of Exercise per Session: Not on file  Stress:   . Feeling of Stress : Not on file  Social Connections:   . Frequency of Communication with Friends and Family: Not on file  . Frequency of Social Gatherings with Friends and Family: Not on file  . Attends Religious Services: Not on file  . Active Member of Clubs or Organizations: Not on file   . Attends Archivist Meetings: Not on file  . Marital Status: Not on file    Family History  Problem Relation Age of Onset  . Thyroid disease Mother   . Hypertension Mother   . Heart attack Mother   . Stroke Mother   . Heart failure Mother   . Diabetes Mother   . Diabetes Father   . Heart attack Father   . Stroke Father   . Kidney disease Father   . Heart failure Father     Outpatient Encounter Medications as of 12/26/2019  Medication Sig  . albuterol (PROVENTIL HFA;VENTOLIN HFA) 108 (90 Base) MCG/ACT inhaler Inhale into the lungs every 4 (four) hours as needed for wheezing.   Marland Kitchen albuterol (PROVENTIL) (2.5 MG/3ML) 0.083% nebulizer solution Inhale contents fo 1 vial in nebulizer every 4 hours if needed  . atorvastatin (LIPITOR) 80 MG tablet Take 80 mg by mouth daily.  . fluticasone-salmeterol (ADVAIR HFA) 115-21 MCG/ACT inhaler Inhale  2 puffs into the lungs 2 (two) times daily as needed (wheezing).   Marland Kitchen glucose blood (ACCU-CHEK AVIVA PLUS) test strip TEST BLOOD SUGAR ONE TO TWO TIMES A DAY as directed  . HYDROcodone-acetaminophen (NORCO/VICODIN) 5-325 MG tablet Take one tab po q 4 hrs prn pain  . hydrOXYzine (ATARAX/VISTARIL) 25 MG tablet Take 25 mg by mouth every 4 (four) hours as needed for anxiety.   Marland Kitchen ibuprofen (ADVIL,MOTRIN) 800 MG tablet Take 800 mg by mouth 3 (three) times daily.   . insulin aspart (NOVOLOG) 100 UNIT/ML injection Inject 14-20 Units into the skin 3 (three) times daily before meals.    . insulin glargine (LANTUS) 100 UNIT/ML injection Inject 80 Units into the skin at bedtime.  . Insulin Pen Needle (BD PEN NEEDLE NANO U/F) 32G X 4 MM MISC ok to sub any brand or size needle preferred by insurance/patient, use up to 5x/day, dx=associated dx  . liraglutide (VICTOZA) 18 MG/3ML SOPN Inject 1.8 mg into the skin daily.   Marland Kitchen lisinopril (ZESTRIL) 20 MG tablet Take 20 mg by mouth daily.  . metFORMIN (GLUCOPHAGE) 1000 MG tablet Take 1,000 mg by mouth 2 (two) times  daily with a meal.   . omeprazole (PRILOSEC) 40 MG capsule Take by mouth.  . SSD 1 % cream Apply 1 application topically daily. For rash   No facility-administered encounter medications on file as of 12/26/2019.    ALLERGIES: Allergies  Allergen Reactions  . Morphine And Related   . Percocet [Oxycodone-Acetaminophen]     VACCINATION STATUS: Immunization History  Administered Date(s) Administered  . Tdap 03/13/2018    Diabetes She presents for her follow-up diabetic visit. She has type 2 diabetes mellitus. Onset time: She was diagnosed at approximately age of 21 years. Her disease course has been improving. There are no hypoglycemic associated symptoms. Pertinent negatives for hypoglycemia include no confusion, headaches, pallor or seizures. Associated symptoms include fatigue, polydipsia and polyuria. Pertinent negatives for diabetes include no chest pain and no polyphagia. There are no hypoglycemic complications. Symptoms are improving. Risk factors for coronary artery disease include dyslipidemia, diabetes mellitus, hypertension, obesity, sedentary lifestyle and tobacco exposure. Current diabetic treatment includes insulin injections. Her weight is fluctuating minimally. She is following a generally unhealthy diet. When asked about meal planning, she reported none. She has not had a previous visit with a dietitian. She never participates in exercise. Her breakfast blood glucose range is generally 140-180 mg/dl. Her lunch blood glucose range is generally 180-200 mg/dl. Her dinner blood glucose range is generally 180-200 mg/dl. Her bedtime blood glucose range is generally 180-200 mg/dl. Her overall blood glucose range is 180-200 mg/dl. (She reports significantly improved glycemic profile since she is started on her adjusted basal/bolus insulin regimen.  Recently, her point-of-care A1c was found to be 10.3%. Fasting between 140--160, prelunch 145-186, presupper between 121-190, bedtime between  156-205.) An ACE inhibitor/angiotensin II receptor blocker is being taken. Eye exam is current.  Hyperlipidemia This is a chronic problem. The problem is uncontrolled. Recent lipid tests were reviewed and are high. Exacerbating diseases include diabetes and obesity. Pertinent negatives include no chest pain, myalgias or shortness of breath. Current antihyperlipidemic treatment includes statins. Risk factors for coronary artery disease include dyslipidemia, diabetes mellitus, family history, hypertension, obesity and a sedentary lifestyle.  Hypertension This is a chronic problem. The current episode started more than 1 year ago. The problem is uncontrolled. Pertinent negatives include no chest pain, headaches, palpitations or shortness of breath. Risk factors for coronary artery  disease include diabetes mellitus, dyslipidemia, obesity, sedentary lifestyle, smoking/tobacco exposure and family history. Past treatments include ACE inhibitors.   Review of systems: Limited as above.    Objective:    Vitals with BMI 12/15/2019 06/19/2018 06/19/2018  Height 5\' 2"  - -  Weight 220 lbs 10 oz - -  BMI 123456 - -  Systolic [id number]  Diastolic 95 96 85  Pulse 92 83 91    There were no vitals taken for this visit.  Wt Readings from Last 3 Encounters:  12/15/19 220 lb 9.6 oz (100.1 kg)  03/13/18 225 lb (102.1 kg)        CMP ( most recent) CMP     Component Value Date/Time   NA 129 (L) 06/19/2018 1109   K 4.4 06/19/2018 1109   CL 95 (L) 06/19/2018 1109   CO2 25 06/19/2018 1109   GLUCOSE 580 (HH) 06/19/2018 1109   BUN 15 09/23/2019 0000   CREATININE 0.7 09/23/2019 0000   CREATININE 0.69 06/19/2018 1109   CALCIUM 9.3 06/19/2018 1109   GFRNONAA >60 06/19/2018 1109   GFRAA >60 06/19/2018 1109     Diabetic Labs (most recent): Lab Results  Component Value Date   HGBA1C 10.3 (A) 12/15/2019   HGBA1C 11.2 09/23/2019     Lipid Panel ( most recent) Lipid Panel     Component Value  Date/Time   CHOL 206 (A) 09/23/2019 0000   TRIG 269 (A) 09/23/2019 0000   HDL 44 09/23/2019 0000   LDLCALC 108 09/23/2019 0000      Lab Results  Component Value Date   TSH 1.42 09/23/2019           Assessment & Plan:   1. Type 2 diabetes mellitus with hyperglycemia, with long-term current use of insulin ([facility]CC)  - Cayce has currently uncontrolled symptomatic type 2 DM since  54 years of age. She reports significantly improved glycemic profile since she is started on her adjusted basal/bolus insulin regimen.  Recently, her point-of-care A1c was found to be 10.3%. Fasting between 140--160, prelunch 145-186, presupper between 121-190, bedtime between 156-205.  Recent labs reviewed.  - I had a long discussion with her about the progressive nature of diabetes and the pathology behind its complications. -her diabetes is complicated by obesity/sedentary life, smoking and she remains at a high risk for more acute and chronic complications which include CAD, CVA, CKD, retinopathy, and neuropathy. These are all discussed in detail with her.  - I have counseled her on diet  and weight management  by adopting a carbohydrate restricted/protein rich diet. Patient is encouraged to switch to  unprocessed or minimally processed     complex starch and increased protein intake (animal or plant source), fruits, and vegetables. -  she is advised to stick to a routine mealtimes to eat 3 meals  a day and avoid unnecessary snacks ( to snack only to correct hypoglycemia).   - she  admits there is a room for improvement in her diet and drink choices. -  Suggestion is made for her to avoid simple carbohydrates  from her diet including Cakes, Sweet Desserts / Pastries, Ice Cream, Soda (diet and regular), Sweet Tea, Candies, Chips, Cookies, Sweet Pastries,  Store Bought Juices, Alcohol in Excess of  1-2 drinks a day, Artificial Sweeteners, Coffee Creamer, and "Sugar-free" Products. This will help patient  to have stable blood glucose profile and potentially avoid unintended weight gain.   - she will be scheduled with Jearld Fenton,  RDN, CDE for diabetes education.  - I have approached her with the following individualized plan to manage  her diabetes and patient agrees:   -Given her current and prevailing glycemic burden, she will continue to need intensive treatment with basal/bolus insulin in order for her to achieve and maintain control of diabetes to target. -Accordingly, she is advised to continue Lantus 80 units daily at bedtime, advised to increase NovoLog to 14 units 3 times a day with meals  for pre-meal BG readings of 90-150mg /dl, plus patient specific correction dose for unexpected hyperglycemia above 150mg /dl, associated with strict monitoring of glucose 4 times a day-before meals and at bedtime. - she is warned not to take insulin without proper monitoring per orders. - Adjustment parameters are given to her for hypo and hyperglycemia in writing. - she is encouraged to call clinic for blood glucose levels less than 70 or above 300 mg /dl. - she is advised to continue Metformin 1000 mg p.o. twice daily, Victoza 1.8 mg subcutaneously daily, therapeutically suitable for patient .  - Specific targets for  A1c;  LDL, HDL,  and Triglycerides were discussed with the patient.  2) Blood Pressure /Hypertension:   she is advised to home monitor blood pressure and report if > 140/90 on 2 separate readings.   she is advised to continue her current medications including lisinopril 20 mg p.o. daily with breakfast . 3) Lipids/Hyperlipidemia:   Review of her recent lipid panel showed un controlled  LDL at 108 .  she  is advised to continue atorvastatin 80 mg p.o. daily at bedtime.  Side effects and precautions discussed with her.  4)  Weight/Diet: Her BMI is 40-   clearly complicating her diabetes care.   she is  a candidate for weight loss. I discussed with her the fact that loss of 5 - 10% of her   current body weight will have the most impact on her diabetes management.  Exercise, and detailed carbohydrates information provided  -  detailed on discharge instructions.  5) Chronic Care/Health Maintenance:  -she  is on ACEI/ARB and Statin medications and  is encouraged to initiate and continue to follow up with Ophthalmology, Dentist,  Podiatrist at least yearly or according to recommendations, and advised to  quit smoking. I have recommended yearly flu vaccine and pneumonia vaccine at least every 5 years; moderate intensity exercise for up to 150 minutes weekly; and  sleep for at least 7 hours a day.  - she is  advised to maintain close follow up with Martin Majestic, FNP for primary care needs, as well as her other providers for optimal and coordinated care.   - Time spent on this patient care encounter:  35 min, of which >50% was spent in  counseling and the rest reviewing her  current and  previous labs/studies ( including abstraction from other facilities),  previous treatments, her blood glucose readings, and medications' doses and developing a plan for long-term care based on the latest recommendations for standards of care; and documenting her care.  Lakela Sobel participated in the discussions, expressed understanding, and voiced agreement with the above plans.  All questions were answered to her satisfaction. she is encouraged to contact clinic should she have any questions or concerns prior to her return visit.    Follow up plan: - Return in about 3 months (around 03/24/2020) for Bring Meter and Logs- A1c in Office, Follow up with Pre-visit Labs.  Glade Lloyd, MD [facility] [address] [place], [place] [address] Phone: [phone]  Fax: [phone]    12/26/2019, 1:16 PM  This note was partially dictated with voice recognition software. Similar sounding words can be transcribed inadequately or may not  be  corrected upon review.

## 2020-01-28 ENCOUNTER — Other Ambulatory Visit: Payer: Self-pay

## 2020-01-28 ENCOUNTER — Encounter: Payer: Medicaid Other | Attending: "Endocrinology | Admitting: Nutrition

## 2020-01-28 ENCOUNTER — Encounter: Payer: Self-pay | Admitting: Nutrition

## 2020-01-28 DIAGNOSIS — F17200 Nicotine dependence, unspecified, uncomplicated: Secondary | ICD-10-CM

## 2020-01-28 DIAGNOSIS — E1165 Type 2 diabetes mellitus with hyperglycemia: Secondary | ICD-10-CM

## 2020-01-28 DIAGNOSIS — Z794 Long term (current) use of insulin: Secondary | ICD-10-CM | POA: Insufficient documentation

## 2020-01-28 DIAGNOSIS — I10 Essential (primary) hypertension: Secondary | ICD-10-CM | POA: Insufficient documentation

## 2020-01-28 DIAGNOSIS — E782 Mixed hyperlipidemia: Secondary | ICD-10-CM | POA: Insufficient documentation

## 2020-01-28 NOTE — Patient Instructions (Signed)
Goals Eat three meals on time Take insulin before meals Drink water Avoid snacks unless blood sugar is below 70  Mg/dl. Marland Kitchen

## 2020-01-28 NOTE — Progress Notes (Signed)
  Medical Nutrition Therapy:  Appt start time: 1500 end time:  1600. Assessment:  Primary concerns today: Diabetes Type 2, Obesity. Here with her daughter. A1C 10.3%. Sees Dr. Dorris Fetch Only eats 1-2 meals per day. Doesn't 'eat meals on time. Says she isn't hungry. Smokes. Not physically active. Takes Lantus 80 units with meals plus Novolog 14 units with meals plus sliding scale. Victoza.  Usually skips lunch so she doesn't take I her insulin then.  Eats before she takes her insulin at bedtime so her blood sugar doesn't drop. Limited engagement.  Preferred Learning Style:     No preference indicated   Learning Readiness:    Contemplating   MEDICATIONS:    DIETARY INTAKE:  Eats 2-3 meals per day. Drinks diet green tea. Doesn't eat a lot of vegetables   Usual physical activity: ADL   Estimated energy needs: 1200  calories 135 g carbohydrates 90 g protein 33 g fat  Progress Towards Goal(s):  In progress.   Nutritional Diagnosis:  NB-1.1 Food and nutrition-related knowledge deficit As related to Diabetes Type 2.  As evidenced by A1C 10.3%.    Intervention:  Nutrition and Diabetes education provided on My Plate, CHO counting, meal planning, portion sizes, timing of meals, avoiding snacks between meals unless having a low blood sugar, target ranges for A1C and blood sugars, signs/symptoms and treatment of hyper/hypoglycemia, monitoring blood sugars, taking medications as prescribed, benefits of exercising 30 minutes per day and prevention of complications of DM.  Goals Eat three meals on time Take insulin before meals Drink water Avoid snacks unless blood sugar is below 70  Mg/dl. .  Teaching Method Utilized:  Visual Auditory Hands on  Handouts given during visit include:  The Plate Method   Meal Plan Card  Diabetes Instructions.   Barriers to learning/adherence to lifestyle change: none  Demonstrated degree of understanding via:  Teach Back    Monitoring/Evaluation:  Dietary intake, exercise, , and body weight in 1 month(s).

## 2020-03-25 ENCOUNTER — Ambulatory Visit: Payer: Medicaid Other | Admitting: "Endocrinology

## 2020-03-25 ENCOUNTER — Ambulatory Visit: Payer: Medicaid Other | Admitting: Nutrition

## 2020-04-22 LAB — BASIC METABOLIC PANEL
BUN: 14 (ref 4–21)
CO2: 27 — AB (ref 13–22)
Chloride: 101 (ref 99–108)
Creatinine: 0.9 (ref 0.5–1.1)
Glucose: 288
Potassium: 3.9 (ref 3.4–5.3)
Sodium: 138 (ref 137–147)

## 2020-04-22 LAB — COMPREHENSIVE METABOLIC PANEL
Albumin: 3.5 (ref 3.5–5.0)
Calcium: 8.3 — AB (ref 8.7–10.7)

## 2020-04-22 LAB — LIPID PANEL
Cholesterol: 205 — AB (ref 0–200)
HDL: 36 (ref 35–70)
LDL Cholesterol: 102
Triglycerides: 334 — AB (ref 40–160)

## 2020-04-22 LAB — HEMOGLOBIN A1C: Hemoglobin A1C: 9.3

## 2020-08-23 ENCOUNTER — Encounter (INDEPENDENT_AMBULATORY_CARE_PROVIDER_SITE_OTHER): Payer: Self-pay | Admitting: Internal Medicine

## 2020-08-23 ENCOUNTER — Ambulatory Visit (INDEPENDENT_AMBULATORY_CARE_PROVIDER_SITE_OTHER): Payer: Medicare HMO | Admitting: Internal Medicine

## 2020-08-23 ENCOUNTER — Other Ambulatory Visit: Payer: Self-pay

## 2020-08-23 VITALS — BP 142/84 | HR 95 | Temp 98.1°F | Ht 60.0 in | Wt 209.2 lb

## 2020-08-23 DIAGNOSIS — Z794 Long term (current) use of insulin: Secondary | ICD-10-CM

## 2020-08-23 DIAGNOSIS — G894 Chronic pain syndrome: Secondary | ICD-10-CM

## 2020-08-23 DIAGNOSIS — E1165 Type 2 diabetes mellitus with hyperglycemia: Secondary | ICD-10-CM

## 2020-08-23 NOTE — Progress Notes (Signed)
Metrics: Intervention Frequency ACO  Documented Smoking Status Yearly  Screened one or more times in 24 months  Cessation Counseling or  Active cessation medication Past 24 months  Past 24 months   Guideline developer: UpToDate (See UpToDate for funding source) Date Released: 2014       Wellness Office Visit  Subjective:  Patient ID: Christine Weaver, female    DOB: 07-03-1966  Age: 54 y.o. MRN: [id number]  CC: This 54 year old patient came to the office to establish care. HPI  Whilst I was reviewing her information and going through the medication list, it appears that she is on opioids on a daily basis.  I informed the patient that I cannot accept her as a patient for this reason even though she is seeing a pain management practice. Past Medical History:  Diagnosis Date  . Asthma   . Diabetes mellitus without complication ([place])   . Hyperlipidemia   . Hypertension    Past Surgical History:  Procedure Laterality Date  . CESAREAN SECTION       Family History  Problem Relation Age of Onset  . Thyroid disease Mother   . Hypertension Mother   . Heart attack Mother   . Stroke Mother   . Heart failure Mother   . Diabetes Mother   . Diabetes Father   . Heart attack Father   . Stroke Father   . Kidney disease Father   . Heart failure Father     Social History   Social History Narrative  . Not on file   Social History   Tobacco Use  . Smoking status: Current Every Day Smoker  . Smokeless tobacco: Never Used  Substance Use Topics  . Alcohol use: Not on file    Current Meds  Medication Sig  . Accu-Chek FastClix Lancets MISC Use 1 each 2 (two) times daily. Use as instructed.  Marland Kitchen albuterol (PROVENTIL HFA;VENTOLIN HFA) 108 (90 Base) MCG/ACT inhaler Inhale into the lungs every 4 (four) hours as needed for wheezing.   Marland Kitchen albuterol (PROVENTIL) (2.5 MG/3ML) 0.083% nebulizer solution Inhale contents fo 1 vial in nebulizer every 4 hours if needed  . amLODipine (NORVASC) 10 MG  tablet Take 10 mg by mouth daily.  Jearl Klinefelter ELLIPTA 62.5-25 MCG/INH AEPB   . atorvastatin (LIPITOR) 80 MG tablet Take 80 mg by mouth daily.  . calcium carbonate (OS-CAL - DOSED IN MG OF ELEMENTAL CALCIUM) 1250 (500 Ca) MG tablet Take 1 tablet by mouth.  . Cholecalciferol (VITAMIN D3) 50 MCG (2000 UT) TABS Take 1 capsule by mouth daily.  . fluconazole (DIFLUCAN) 150 MG tablet Take 150 mg by mouth as needed.  . fluticasone (FLONASE) 50 MCG/ACT nasal spray Place into both nostrils daily.  . fluticasone-salmeterol (ADVAIR HFA) 115-21 MCG/ACT inhaler Inhale 2 puffs into the lungs 2 (two) times daily as needed (wheezing).   Marland Kitchen glucose blood (ACCU-CHEK AVIVA PLUS) test strip TEST BLOOD SUGAR ONE TO TWO TIMES A DAY as directed  . HYDROcodone-acetaminophen (NORCO/VICODIN) 5-325 MG tablet Take one tab po q 4 hrs prn pain  . hydrOXYzine (ATARAX/VISTARIL) 25 MG tablet Take 25 mg by mouth every 4 (four) hours as needed for anxiety.   Marland Kitchen ibuprofen (ADVIL,MOTRIN) 800 MG tablet Take 800 mg by mouth 3 (three) times daily.   . insulin aspart (NOVOLOG) 100 UNIT/ML injection Inject 14-20 Units into the skin 3 (three) times daily before meals.    . insulin glargine (LANTUS) 100 UNIT/ML injection Inject 80 Units into the skin at  bedtime.  . Insulin Pen Needle (BD PEN NEEDLE NANO U/F) 32G X 4 MM MISC ok to sub any brand or size needle preferred by insurance/patient, use up to 5x/day, dx=associated dx  . liraglutide (VICTOZA) 18 MG/3ML SOPN Inject 1.8 mg into the skin daily.   Marland Kitchen lisinopril (ZESTRIL) 20 MG tablet Take 20 mg by mouth daily.  . metFORMIN (GLUCOPHAGE) 1000 MG tablet Take 1,000 mg by mouth 2 (two) times daily with a meal.   . mirtazapine (REMERON) 30 MG tablet   . nystatin cream (MYCOSTATIN) Apply 1 application topically 2 (two) times daily.  Marland Kitchen omeprazole (PRILOSEC) 40 MG capsule Take by mouth.  . SSD 1 % cream Apply 1 application topically daily. For rash      Depression screen [facility] 2/9 01/28/2020    Decreased Interest 0  Down, Depressed, Hopeless 0  PHQ - 2 Score 0     Objective:   Today's Vitals: BP (!) 142/84   Pulse 95   Temp 98.1 F (36.7 C) (Temporal)   Ht 5' (1.524 m)   Wt 209 lb 3.2 oz (94.9 kg)   SpO2 96%   BMI 40.86 kg/m  Vitals with BMI 08/23/2020 12/15/2019 06/19/2018  Height 5\' 0"  5\' 2"  -  Weight 209 lbs 3 oz 220 lbs 10 oz -  BMI 40.98 11.91 -  Systolic 478 295 621  Diastolic 84 95 96  Pulse 95 92 83     Physical Exam       Assessment   1. Type 2 diabetes mellitus with hyperglycemia, with long-term current use of insulin ([place])   2. Chronic pain syndrome       Tests ordered No orders of the defined types were placed in this encounter.    Plan: 1. Unfortunately, I apologize to the patient that our screening procedure should have been better and that we will not be able to see her as a patient in this practice as long as she taking chronic opioids.  I will not make any follow-up appointments and I will not charge her for this visit.  I told her that I could recommend other physicians in this area who would probably accept her as a patient and she said she already has someone in mind.   No orders of the defined types were placed in this encounter.   Doree Albee, MD

## 2020-08-27 DIAGNOSIS — M546 Pain in thoracic spine: Secondary | ICD-10-CM | POA: Diagnosis not present

## 2020-08-27 DIAGNOSIS — E1142 Type 2 diabetes mellitus with diabetic polyneuropathy: Secondary | ICD-10-CM | POA: Diagnosis not present

## 2020-08-27 DIAGNOSIS — M5431 Sciatica, right side: Secondary | ICD-10-CM | POA: Diagnosis not present

## 2020-08-27 DIAGNOSIS — M542 Cervicalgia: Secondary | ICD-10-CM | POA: Diagnosis not present

## 2020-08-27 DIAGNOSIS — R202 Paresthesia of skin: Secondary | ICD-10-CM | POA: Diagnosis not present

## 2020-08-27 DIAGNOSIS — M5432 Sciatica, left side: Secondary | ICD-10-CM | POA: Diagnosis not present

## 2020-08-27 DIAGNOSIS — M545 Low back pain, unspecified: Secondary | ICD-10-CM | POA: Diagnosis not present

## 2020-08-27 DIAGNOSIS — G894 Chronic pain syndrome: Secondary | ICD-10-CM | POA: Diagnosis not present

## 2020-08-27 DIAGNOSIS — G8929 Other chronic pain: Secondary | ICD-10-CM | POA: Diagnosis not present

## 2020-08-27 DIAGNOSIS — Z79891 Long term (current) use of opiate analgesic: Secondary | ICD-10-CM | POA: Diagnosis not present

## 2020-08-27 DIAGNOSIS — F1120 Opioid dependence, uncomplicated: Secondary | ICD-10-CM | POA: Diagnosis not present

## 2020-09-17 DIAGNOSIS — Z79891 Long term (current) use of opiate analgesic: Secondary | ICD-10-CM | POA: Diagnosis not present

## 2020-09-17 DIAGNOSIS — M546 Pain in thoracic spine: Secondary | ICD-10-CM | POA: Diagnosis not present

## 2020-09-17 DIAGNOSIS — R202 Paresthesia of skin: Secondary | ICD-10-CM | POA: Diagnosis not present

## 2020-09-17 DIAGNOSIS — G8929 Other chronic pain: Secondary | ICD-10-CM | POA: Diagnosis not present

## 2020-09-17 DIAGNOSIS — M545 Low back pain, unspecified: Secondary | ICD-10-CM | POA: Diagnosis not present

## 2020-09-17 DIAGNOSIS — M542 Cervicalgia: Secondary | ICD-10-CM | POA: Diagnosis not present

## 2020-09-17 DIAGNOSIS — F1120 Opioid dependence, uncomplicated: Secondary | ICD-10-CM | POA: Diagnosis not present

## 2020-09-17 DIAGNOSIS — M5431 Sciatica, right side: Secondary | ICD-10-CM | POA: Diagnosis not present

## 2020-09-17 DIAGNOSIS — M5432 Sciatica, left side: Secondary | ICD-10-CM | POA: Diagnosis not present

## 2020-09-17 DIAGNOSIS — E1142 Type 2 diabetes mellitus with diabetic polyneuropathy: Secondary | ICD-10-CM | POA: Diagnosis not present

## 2020-09-17 DIAGNOSIS — G894 Chronic pain syndrome: Secondary | ICD-10-CM | POA: Diagnosis not present

## 2020-10-15 DIAGNOSIS — Z79891 Long term (current) use of opiate analgesic: Secondary | ICD-10-CM | POA: Diagnosis not present

## 2020-10-15 DIAGNOSIS — E1142 Type 2 diabetes mellitus with diabetic polyneuropathy: Secondary | ICD-10-CM | POA: Diagnosis not present

## 2020-10-15 DIAGNOSIS — G894 Chronic pain syndrome: Secondary | ICD-10-CM | POA: Diagnosis not present

## 2020-10-15 DIAGNOSIS — M545 Low back pain, unspecified: Secondary | ICD-10-CM | POA: Diagnosis not present

## 2020-10-15 DIAGNOSIS — M542 Cervicalgia: Secondary | ICD-10-CM | POA: Diagnosis not present

## 2020-10-15 DIAGNOSIS — F1120 Opioid dependence, uncomplicated: Secondary | ICD-10-CM | POA: Diagnosis not present

## 2020-10-15 DIAGNOSIS — R202 Paresthesia of skin: Secondary | ICD-10-CM | POA: Diagnosis not present

## 2020-10-15 DIAGNOSIS — M5431 Sciatica, right side: Secondary | ICD-10-CM | POA: Diagnosis not present

## 2020-10-15 DIAGNOSIS — M546 Pain in thoracic spine: Secondary | ICD-10-CM | POA: Diagnosis not present

## 2020-10-15 DIAGNOSIS — G8929 Other chronic pain: Secondary | ICD-10-CM | POA: Diagnosis not present

## 2020-10-15 DIAGNOSIS — M5432 Sciatica, left side: Secondary | ICD-10-CM | POA: Diagnosis not present

## 2020-11-10 ENCOUNTER — Ambulatory Visit (INDEPENDENT_AMBULATORY_CARE_PROVIDER_SITE_OTHER): Payer: Medicare Other | Admitting: Internal Medicine

## 2020-11-10 ENCOUNTER — Encounter: Payer: Self-pay | Admitting: Internal Medicine

## 2020-11-10 ENCOUNTER — Other Ambulatory Visit: Payer: Self-pay

## 2020-11-10 VITALS — BP 145/84 | HR 97 | Temp 97.8°F | Resp 18 | Ht 61.0 in | Wt 219.1 lb

## 2020-11-10 DIAGNOSIS — G894 Chronic pain syndrome: Secondary | ICD-10-CM

## 2020-11-10 DIAGNOSIS — E7849 Other hyperlipidemia: Secondary | ICD-10-CM | POA: Diagnosis not present

## 2020-11-10 DIAGNOSIS — I10 Essential (primary) hypertension: Secondary | ICD-10-CM | POA: Diagnosis not present

## 2020-11-10 DIAGNOSIS — Z0100 Encounter for examination of eyes and vision without abnormal findings: Secondary | ICD-10-CM

## 2020-11-10 DIAGNOSIS — Z7689 Persons encountering health services in other specified circumstances: Secondary | ICD-10-CM | POA: Diagnosis not present

## 2020-11-10 DIAGNOSIS — E119 Type 2 diabetes mellitus without complications: Secondary | ICD-10-CM

## 2020-11-10 DIAGNOSIS — F17200 Nicotine dependence, unspecified, uncomplicated: Secondary | ICD-10-CM

## 2020-11-10 DIAGNOSIS — J4489 Other specified chronic obstructive pulmonary disease: Secondary | ICD-10-CM | POA: Insufficient documentation

## 2020-11-10 DIAGNOSIS — Z794 Long term (current) use of insulin: Secondary | ICD-10-CM

## 2020-11-10 DIAGNOSIS — Z8619 Personal history of other infectious and parasitic diseases: Secondary | ICD-10-CM

## 2020-11-10 DIAGNOSIS — J449 Chronic obstructive pulmonary disease, unspecified: Secondary | ICD-10-CM

## 2020-11-10 DIAGNOSIS — Z1211 Encounter for screening for malignant neoplasm of colon: Secondary | ICD-10-CM

## 2020-11-10 MED ORDER — LISINOPRIL 20 MG PO TABS
20.0000 mg | ORAL_TABLET | Freq: Every day | ORAL | 1 refills | Status: DC
Start: 2020-11-10 — End: 2020-11-11

## 2020-11-10 MED ORDER — ANORO ELLIPTA 62.5-25 MCG/INH IN AEPB: 1.0000 | INHALATION_SPRAY | Freq: Every day | RESPIRATORY_TRACT | 5 refills | Status: DC

## 2020-11-10 MED ORDER — AMLODIPINE BESYLATE 10 MG PO TABS: 10.0000 mg | ORAL_TABLET | Freq: Every day | ORAL | 1 refills | Status: DC

## 2020-11-10 MED ORDER — ALBUTEROL SULFATE HFA 108 (90 BASE) MCG/ACT IN AERS: 2.0000 | INHALATION_SPRAY | RESPIRATORY_TRACT | 5 refills | Status: AC | PRN

## 2020-11-10 MED ORDER — INSULIN ASPART 100 UNIT/ML ~~LOC~~ SOLN: 14.0000 [IU] | Freq: Three times a day (TID) | SUBCUTANEOUS | 2 refills | Status: DC

## 2020-11-10 MED ORDER — ATORVASTATIN CALCIUM 80 MG PO TABS: 80.0000 mg | ORAL_TABLET | Freq: Every day | ORAL | 1 refills | Status: DC

## 2020-11-10 MED ORDER — NICOTINE 7 MG/24HR TD PT24: 7.0000 mg | MEDICATED_PATCH | Freq: Every day | TRANSDERMAL | 0 refills | Status: DC

## 2020-11-10 NOTE — Assessment & Plan Note (Signed)
Care established Previous chart reviewed History and medications reviewed with the patient 

## 2020-11-10 NOTE — Assessment & Plan Note (Signed)
Due to Lyme's disease complication On Norco Follows up with Pain specialist 

## 2020-11-10 NOTE — Patient Instructions (Signed)
Please continue taking medications as prescribed.  Please follow up with Dr Dorris Fetch - Endocrinologist as prescribed.  Please follow low carbohydrate and low sodium diet.  Please perform moderate exercise/walking at least 150 mins/week.

## 2020-11-10 NOTE — Assessment & Plan Note (Signed)
On Anoro Ellipta and Advair Uses Albuterol nebs and inhaler PRN Not on home O2 Recently quit smoking, prescribed nicotine patch

## 2020-11-10 NOTE — Assessment & Plan Note (Signed)
About 1-1.5 pack/day, recently quit smoking  Asked about quitting: confirms that she currently smokes cigarettes Advise to quit smoking: Educated about QUITTING to reduce the risk of cancer, cardio and cerebrovascular disease. Assess willingness: Quit few days ago. Assist with counseling and pharmacotherapy: Counseled for 5 minutes and literature provided. Arrange for follow up: Follow up in 3 months and continue to offer help.

## 2020-11-10 NOTE — Assessment & Plan Note (Signed)
Diet modification and moderate exercise/walking advised. ?On Trulicity for diabetes ?

## 2020-11-10 NOTE — Assessment & Plan Note (Signed)
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

## 2020-11-10 NOTE — Progress Notes (Signed)
New Patient Office Visit  Subjective:  Patient ID: Christine Weaver, female    DOB: Nov 16, 1965  Age: 55 y.o. MRN: [id number]  CC:  Chief Complaint  Patient presents with  . New Patient (Initial Visit)    New patient establishing care     HPI Christine Weaver is a 55 year old female with past medical history of hypertension, type II DM, hyperlipidemia, COPD, Lyme's disease related arthritis, chronic pain syndrome and morbid obesity who presents for establishing care.  She uses Lantus and NovoLog for her diabetes.  Last HbA1C was 9.3 in 03/2020.  She also takes NovoLog according to sliding scale, Victoza and metformin.  She follows up with Dr. Dorris Fetch.  She denies any dysuria, hematuria or urinary frequency.  She checks her blood glucose at home and it has been ranging between 150-200.  Her blood pressure was 145/84 in the office today.  She takes amlodipine and lisinopril regularly.  She denies any headache, dizziness, chest pain, dyspnea or palpitations.  She has a history of Lyme's disease related arthritis, and takes Norco for chronic pain syndrome.  She follows up with pain specialist.  She uses Anoro and Advair inhalers regularly, and albuterol nebs and inhaler as needed.  She agreed to get Cologuard for colon cancer screening.  She denies to get Pap smear.  Last mammography in 2020.  She has not had COVID or flu vaccines yet.  Past Medical History:  Diagnosis Date  . Asthma   . COPD (chronic obstructive pulmonary disease) ([place])    Phreesia 11/07/2020  . Diabetes mellitus without complication ([place])   . Hyperlipidemia   . Hypertension     Past Surgical History:  Procedure Laterality Date  . CESAREAN SECTION      Family History  Problem Relation Age of Onset  . Thyroid disease Mother   . Hypertension Mother   . Heart attack Mother   . Stroke Mother   . Heart failure Mother   . Diabetes Mother   . Diabetes Father   . Heart attack Father   . Stroke Father   . Kidney  disease Father   . Heart failure Father     Social History   Socioeconomic History  . Marital status: Married    Spouse name: Not on file  . Number of children: Not on file  . Years of education: Not on file  . Highest education level: Not on file  Occupational History  . Not on file  Tobacco Use  . Smoking status: Current Every Day Smoker  . Smokeless tobacco: Never Used  Vaping Use  . Vaping Use: Never used  Substance and Sexual Activity  . Alcohol use: Not on file  . Drug use: Not on file  . Sexual activity: Not on file  Other Topics Concern  . Not on file  Social History Narrative  . Not on file   Social Determinants of Health   Financial Resource Strain: Not on file  Food Insecurity: Not on file  Transportation Needs: Not on file  Physical Activity: Not on file  Stress: Not on file  Social Connections: Not on file  Intimate Partner Violence: Not on file    ROS Review of Systems  Constitutional: Negative for chills and fever.  HENT: Negative for congestion, sinus pressure, sinus pain and sore throat.   Eyes: Negative for pain and discharge.  Respiratory: Positive for shortness of breath and wheezing. Negative for cough.   Cardiovascular: Negative for chest pain and palpitations.  Gastrointestinal: Negative for abdominal pain, constipation, diarrhea, nausea and vomiting.  Endocrine: Negative for polydipsia and polyuria.  Genitourinary: Negative for dysuria and hematuria.  Musculoskeletal: Positive for arthralgias and back pain. Negative for neck pain and neck stiffness.  Skin: Negative for rash.  Neurological: Negative for dizziness and weakness.  Psychiatric/Behavioral: Negative for agitation and behavioral problems.    Objective:   Today's Vitals: BP (!) 145/84 (BP Location: Left Arm, Patient Position: Sitting, Cuff Size: Normal)   Pulse 97   Temp 97.8 F (36.6 C) (Oral)   Resp 18   Ht 5' 1"  (1.549 m)   Wt 219 lb 1.9 oz (99.4 kg)   SpO2 92%   BMI  41.40 kg/m   Physical Exam Vitals reviewed.  Constitutional:      General: She is not in acute distress.    Appearance: She is obese. She is not diaphoretic.  HENT:     Head: Normocephalic and atraumatic.     Nose: Nose normal.     Mouth/Throat:     Mouth: Mucous membranes are moist.  Eyes:     General: No scleral icterus.    Extraocular Movements: Extraocular movements intact.     Pupils: Pupils are equal, round, and reactive to light.  Cardiovascular:     Rate and Rhythm: Normal rate and regular rhythm.     Pulses: Normal pulses.     Heart sounds: Normal heart sounds. No murmur heard.   Pulmonary:     Breath sounds: Normal breath sounds. No wheezing or rales.  Abdominal:     Palpations: Abdomen is soft.     Tenderness: There is no abdominal tenderness.  Musculoskeletal:     Cervical back: Neck supple. No tenderness.     Right lower leg: No edema.     Left lower leg: No edema.  Skin:    General: Skin is warm.     Findings: No rash.  Neurological:     General: No focal deficit present.     Mental Status: She is alert and oriented to person, place, and time.     Sensory: No sensory deficit.     Motor: No weakness.  Psychiatric:        Mood and Affect: Mood normal.        Behavior: Behavior normal.     Assessment & Plan:   Problem List Items Addressed This Visit      Cardiovascular and Mediastinum   Hypertension    BP Readings from Last 1 Encounters:  11/10/20 (!) 145/84   Elevated, patient states that she is nervous today Continue Amlodipine and Lisinopril for now, will adjust dose if BP persistently elevated in the next visit Counseled for compliance with the medications Advised DASH diet and moderate exercise/walking, at least 150 mins/week       Relevant Medications   amLODipine (NORVASC) 10 MG tablet   atorvastatin (LIPITOR) 80 MG tablet   lisinopril (ZESTRIL) 20 MG tablet     Respiratory   Chronic obstructive pulmonary disease (HCC)    On Anoro  Ellipta and Advair Uses Albuterol nebs and inhaler PRN Not on home O2 Recently quit smoking, prescribed nicotine patch      Relevant Medications   nicotine (NICODERM CQ - DOSED IN MG/24 HR) 7 mg/24hr patch   albuterol (VENTOLIN HFA) 108 (90 Base) MCG/ACT inhaler   ANORO ELLIPTA 62.5-25 MCG/INH AEPB     Endocrine   Type 2 diabetes mellitus without complication, with long-term current use of insulin (  [place])    Lab Results  Component Value Date   HGBA1C 9.3 04/22/2020   Takes Lantus 80 U qHS, Novolog 14-20 U TID according to sliding scale Trulicity 1.8 mg QD Metformin 1000 mg BID Follows up with Dr Dorris Fetch On statin and ACEi Referred to Ophthalmology for diabetic eye exam      Relevant Medications   atorvastatin (LIPITOR) 80 MG tablet   insulin aspart (NOVOLOG) 100 UNIT/ML injection   lisinopril (ZESTRIL) 20 MG tablet     Other   Hyperlipidemia    On Lipitor Check lipid profile      Relevant Medications   amLODipine (NORVASC) 10 MG tablet   atorvastatin (LIPITOR) 80 MG tablet   lisinopril (ZESTRIL) 20 MG tablet   Current smoker    About 1-1.5 pack/day, recently quit smoking  Asked about quitting: confirms that she currently smokes cigarettes Advise to quit smoking: Educated about QUITTING to reduce the risk of cancer, cardio and cerebrovascular disease. Assess willingness: Quit few days ago. Assist with counseling and pharmacotherapy: Counseled for 5 minutes and literature provided. Arrange for follow up: Follow up in 3 months and continue to offer help.      Relevant Medications   nicotine (NICODERM CQ - DOSED IN MG/24 HR) 7 mg/24hr patch   Encounter to establish care - Primary    Care established Previous chart reviewed History and medications reviewed with the patient      Relevant Orders   CBC with Differential   CMP14+EGFR   Lipid Profile   Vitamin D (25 hydroxy)   Urinalysis   TSH + free T4   Hemoglobin A1c   Chronic pain syndrome    Due to Lyme's  disease complication On Norco Follows up with Pain specialist      History of Lyme disease    Has chronic arthritis from Lyme disease On Pain medications, follows up with Pain specialist       Other Visit Diagnoses    Diabetic eye exam ([place])       Relevant Medications   atorvastatin (LIPITOR) 80 MG tablet   insulin aspart (NOVOLOG) 100 UNIT/ML injection   lisinopril (ZESTRIL) 20 MG tablet   Other Relevant Orders   Ambulatory referral to Ophthalmology   Special screening for malignant neoplasms, colon       Relevant Orders   Cologuard      Outpatient Encounter Medications as of 11/10/2020  Medication Sig  . Accu-Chek FastClix Lancets MISC Use 1 each 2 (two) times daily. Use as instructed.  Marland Kitchen albuterol (PROVENTIL) (2.5 MG/3ML) 0.083% nebulizer solution Inhale contents fo 1 vial in nebulizer every 4 hours if needed  . calcium carbonate (OS-CAL - DOSED IN MG OF ELEMENTAL CALCIUM) 1250 (500 Ca) MG tablet Take 1 tablet by mouth.  . Cholecalciferol (VITAMIN D3) 50 MCG (2000 UT) TABS Take 1 capsule by mouth daily.  . fluconazole (DIFLUCAN) 150 MG tablet Take 150 mg by mouth as needed.  . fluticasone (FLONASE) 50 MCG/ACT nasal spray Place into both nostrils daily.  . fluticasone-salmeterol (ADVAIR HFA) 115-21 MCG/ACT inhaler Inhale 2 puffs into the lungs 2 (two) times daily as needed (wheezing).   Marland Kitchen glucose blood (ACCU-CHEK AVIVA PLUS) test strip TEST BLOOD SUGAR ONE TO TWO TIMES A DAY as directed  . HYDROcodone-acetaminophen (NORCO/VICODIN) 5-325 MG tablet Take one tab po q 4 hrs prn pain  . ibuprofen (ADVIL,MOTRIN) 800 MG tablet Take 800 mg by mouth 3 (three) times daily.   . insulin glargine (LANTUS) 100  UNIT/ML injection Inject 80 Units into the skin at bedtime.  . Insulin Pen Needle (BD PEN NEEDLE NANO U/F) 32G X 4 MM MISC ok to sub any brand or size needle preferred by insurance/patient, use up to 5x/day, dx=associated dx  . liraglutide (VICTOZA) 18 MG/3ML SOPN Inject 1.8 mg into  the skin daily.   . metFORMIN (GLUCOPHAGE) 1000 MG tablet Take 1,000 mg by mouth 2 (two) times daily with a meal.   . nicotine (NICODERM CQ - DOSED IN MG/24 HR) 7 mg/24hr patch Place 1 patch (7 mg total) onto the skin daily.  Marland Kitchen nystatin cream (MYCOSTATIN) Apply 1 application topically 2 (two) times daily.  Marland Kitchen omeprazole (PRILOSEC) 40 MG capsule Take by mouth.  . SSD 1 % cream Apply 1 application topically daily. For rash  . [DISCONTINUED] albuterol (PROVENTIL HFA;VENTOLIN HFA) 108 (90 Base) MCG/ACT inhaler Inhale into the lungs every 4 (four) hours as needed for wheezing.   . [DISCONTINUED] amLODipine (NORVASC) 10 MG tablet Take 10 mg by mouth daily.  . [DISCONTINUED] ANORO ELLIPTA 62.5-25 MCG/INH AEPB   . [DISCONTINUED] atorvastatin (LIPITOR) 80 MG tablet Take 80 mg by mouth daily.  . [DISCONTINUED] insulin aspart (NOVOLOG) 100 UNIT/ML injection Inject 14-20 Units into the skin 3 (three) times daily before meals.    . [DISCONTINUED] lisinopril (ZESTRIL) 20 MG tablet Take 20 mg by mouth daily.  . [DISCONTINUED] mirtazapine (REMERON) 30 MG tablet   . albuterol (VENTOLIN HFA) 108 (90 Base) MCG/ACT inhaler Inhale 2 puffs into the lungs every 4 (four) hours as needed for wheezing or shortness of breath.  Marland Kitchen amLODipine (NORVASC) 10 MG tablet Take 1 tablet (10 mg total) by mouth daily.  Christine Weaver ELLIPTA 62.5-25 MCG/INH AEPB Inhale 1 puff into the lungs daily.  Marland Kitchen atorvastatin (LIPITOR) 80 MG tablet Take 1 tablet (80 mg total) by mouth daily.  . insulin aspart (NOVOLOG) 100 UNIT/ML injection Inject 14-20 Units into the skin 3 (three) times daily before meals.  Marland Kitchen lisinopril (ZESTRIL) 20 MG tablet Take 1 tablet (20 mg total) by mouth daily.  . [DISCONTINUED] hydrOXYzine (ATARAX/VISTARIL) 25 MG tablet Take 25 mg by mouth every 4 (four) hours as needed for anxiety.  (Patient not taking: Reported on 11/10/2020)   No facility-administered encounter medications on file as of 11/10/2020.    Follow-up: Return in  about 3 months (around 02/08/2021).   Lindell Spar, MD

## 2020-11-10 NOTE — Assessment & Plan Note (Signed)
On Lipitor Check lipid profile 

## 2020-11-10 NOTE — Assessment & Plan Note (Addendum)
Lab Results  Component Value Date   HGBA1C 9.3 04/22/2020   Takes Lantus 80 U qHS, Novolog 14-20 U TID according to sliding scale Trulicity 1.8 mg QD Metformin 1000 mg BID Follows up with Dr Dorris Fetch On statin and ACEi Referred to Ophthalmology for diabetic eye exam

## 2020-11-10 NOTE — Assessment & Plan Note (Signed)
BP Readings from Last 1 Encounters:  11/10/20 (!) 145/84   Elevated, patient states that she is nervous today Continue Amlodipine and Lisinopril for now, will adjust dose if BP persistently elevated in the next visit Counseled for compliance with the medications Advised DASH diet and moderate exercise/walking, at least 150 mins/week

## 2020-11-11 ENCOUNTER — Other Ambulatory Visit: Payer: Self-pay | Admitting: Internal Medicine

## 2020-11-11 DIAGNOSIS — I10 Essential (primary) hypertension: Secondary | ICD-10-CM

## 2020-11-12 ENCOUNTER — Other Ambulatory Visit: Payer: Self-pay | Admitting: Internal Medicine

## 2020-11-12 DIAGNOSIS — E559 Vitamin D deficiency, unspecified: Secondary | ICD-10-CM

## 2020-11-12 LAB — URINALYSIS
Bilirubin, UA: NEGATIVE
Glucose, UA: NEGATIVE
Ketones, UA: NEGATIVE
Leukocytes,UA: NEGATIVE
Nitrite, UA: NEGATIVE
RBC, UA: NEGATIVE
Specific Gravity, UA: 1.007 (ref 1.005–1.030)
Urobilinogen, Ur: 0.2 mg/dL (ref 0.2–1.0)
pH, UA: 7 (ref 5.0–7.5)

## 2020-11-12 LAB — VITAMIN D 25 HYDROXY (VIT D DEFICIENCY, FRACTURES): Vit D, 25-Hydroxy: 7.8 ng/mL — ABNORMAL LOW (ref 30.0–100.0)

## 2020-11-12 LAB — CBC WITH DIFFERENTIAL/PLATELET
Basophils Absolute: 0.1 10*3/uL (ref 0.0–0.2)
Basos: 1 %
EOS (ABSOLUTE): 0.2 10*3/uL (ref 0.0–0.4)
Eos: 2 %
Hematocrit: 47.3 % — ABNORMAL HIGH (ref 34.0–46.6)
Hemoglobin: 14.6 g/dL (ref 11.1–15.9)
Immature Grans (Abs): 0 10*3/uL (ref 0.0–0.1)
Immature Granulocytes: 0 %
Lymphocytes Absolute: 2.1 10*3/uL (ref 0.7–3.1)
Lymphs: 22 %
MCH: 24.9 pg — ABNORMAL LOW (ref 26.6–33.0)
MCHC: 30.9 g/dL — ABNORMAL LOW (ref 31.5–35.7)
MCV: 81 fL (ref 79–97)
Monocytes Absolute: 0.7 10*3/uL (ref 0.1–0.9)
Monocytes: 8 %
Neutrophils Absolute: 6.4 10*3/uL (ref 1.4–7.0)
Neutrophils: 67 %
Platelets: 253 10*3/uL (ref 150–450)
RBC: 5.86 x10E6/uL — ABNORMAL HIGH (ref 3.77–5.28)
RDW: 13.3 % (ref 11.7–15.4)
WBC: 9.5 10*3/uL (ref 3.4–10.8)

## 2020-11-12 LAB — CMP14+EGFR
ALT: 14 IU/L (ref 0–32)
AST: 10 IU/L (ref 0–40)
Albumin/Globulin Ratio: 1.5 (ref 1.2–2.2)
Albumin: 3.5 g/dL — ABNORMAL LOW (ref 3.8–4.9)
Alkaline Phosphatase: 116 IU/L (ref 44–121)
BUN/Creatinine Ratio: 14 (ref 9–23)
BUN: 13 mg/dL (ref 6–24)
Bilirubin Total: 0.3 mg/dL (ref 0.0–1.2)
CO2: 26 mmol/L (ref 20–29)
Calcium: 9.1 mg/dL (ref 8.7–10.2)
Chloride: 100 mmol/L (ref 96–106)
Creatinine, Ser: 0.95 mg/dL (ref 0.57–1.00)
GFR calc Af Amer: 80 mL/min/{1.73_m2} (ref >59)
GFR calc non Af Amer: 69 mL/min/{1.73_m2} (ref >59)
Globulin, Total: 2.4 g/dL (ref 1.5–4.5)
Glucose: 130 mg/dL — ABNORMAL HIGH (ref 65–99)
Potassium: 4.2 mmol/L (ref 3.5–5.2)
Sodium: 140 mmol/L (ref 134–144)
Total Protein: 5.9 g/dL — ABNORMAL LOW (ref 6.0–8.5)

## 2020-11-12 LAB — LIPID PANEL
Chol/HDL Ratio: 3.9 ratio (ref 0.0–4.4)
Cholesterol, Total: 175 mg/dL (ref 100–199)
HDL: 45 mg/dL (ref >39)
LDL Chol Calc (NIH): 102 mg/dL — ABNORMAL HIGH (ref 0–99)
Triglycerides: 159 mg/dL — ABNORMAL HIGH (ref 0–149)
VLDL Cholesterol Cal: 28 mg/dL (ref 5–40)

## 2020-11-12 LAB — TSH+FREE T4
Free T4: 1.28 ng/dL (ref 0.82–1.77)
TSH: 2.64 u[IU]/mL (ref 0.450–4.500)

## 2020-11-12 LAB — HEMOGLOBIN A1C
Est. average glucose Bld gHb Est-mCnc: 226 mg/dL
Hgb A1c MFr Bld: 9.5 % — ABNORMAL HIGH (ref 4.8–5.6)

## 2020-11-12 MED ORDER — VITAMIN D (ERGOCALCIFEROL) 1.25 MG (50000 UNIT) PO CAPS: 50000.0000 [IU] | ORAL_CAPSULE | ORAL | 1 refills | Status: DC

## 2020-11-15 ENCOUNTER — Telehealth: Payer: Self-pay

## 2020-11-15 NOTE — Telephone Encounter (Signed)
Patient returning your call. Please contact patient [phone].

## 2020-11-15 NOTE — Telephone Encounter (Signed)
Pt informed of lab results. 

## 2020-11-17 LAB — COLOGUARD: Cologuard: POSITIVE — AB

## 2020-11-18 ENCOUNTER — Telehealth: Payer: Self-pay

## 2020-11-18 NOTE — Telephone Encounter (Signed)
Patient calling she is requesting a script for prednisone for her lungs to [facility] [address] also she had her first dose of covid vaccine jan 15th 2022

## 2020-11-19 ENCOUNTER — Other Ambulatory Visit: Payer: Self-pay

## 2020-11-19 ENCOUNTER — Telehealth (INDEPENDENT_AMBULATORY_CARE_PROVIDER_SITE_OTHER): Payer: Medicare Other | Admitting: Internal Medicine

## 2020-11-19 ENCOUNTER — Encounter: Payer: Self-pay | Admitting: Internal Medicine

## 2020-11-19 VITALS — Ht 60.0 in | Wt 219.0 lb

## 2020-11-19 DIAGNOSIS — J329 Chronic sinusitis, unspecified: Secondary | ICD-10-CM

## 2020-11-19 DIAGNOSIS — J441 Chronic obstructive pulmonary disease with (acute) exacerbation: Secondary | ICD-10-CM | POA: Diagnosis not present

## 2020-11-19 MED ORDER — PREDNISONE 10 MG (21) PO TBPK: ORAL_TABLET | ORAL | 0 refills | Status: DC

## 2020-11-19 MED ORDER — AMOXICILLIN-POT CLAVULANATE 875-125 MG PO TABS
1.0000 | ORAL_TABLET | Freq: Two times a day (BID) | ORAL | 0 refills | Status: DC
Start: 2020-11-19 — End: 2020-12-09

## 2020-11-19 NOTE — Progress Notes (Signed)
Virtual Visit via Telephone Note   This visit type was conducted due to national recommendations for restrictions regarding the COVID-19 Pandemic (e.g. social distancing) in an effort to limit this patient's exposure and mitigate transmission in our community.  Due to her co-morbid illnesses, this patient is at least at moderate risk for complications without adequate follow up.  This format is felt to be most appropriate for this patient at this time.  The patient did not have access to video technology/had technical difficulties with video requiring transitioning to audio format only (telephone).  All issues noted in this document were discussed and addressed.  No physical exam could be performed with this format.  Evaluation Performed:  Follow-up visit  Date:  11/19/2020   ID:  Christine Weaver, Christine Weaver, MRN [id number]  Patient Location: Home Provider Location: Office/Clinic  Location of Patient: Home Location of Provider: Telehealth Consent was obtain for visit to be over via telehealth. I verified that I am speaking with the correct person using two identifiers.  PCP:  Lindell Spar, MD   Chief Complaint:  Cough and chest congestion  History of Present Illness:    Christine Weaver is a 55 y.o. female with PMH of COPD and type 2 DM who has a televisit for c/o cough and congestion for last 5 days. She has been having clear mucous. She also c/o mild dyspnea and wheezing, which is better with Albuterol inhaler. She had home COVID test, which was negative. She denies any fever, chills, sore throat, or change in taste or smell sensation. Of note, she has not had COVID or flu vaccines yet.  The patient does have symptoms concerning for COVID-19 infection (fever, chills, cough, or new shortness of breath).   Past Medical, Surgical, Social History, Allergies, and Medications have been Reviewed.  Past Medical History:  Diagnosis Date  . Asthma   . COPD (chronic obstructive  pulmonary disease) ([place])    Phreesia 11/07/2020  . Diabetes mellitus without complication ([place])   . Hyperlipidemia   . Hypertension    Past Surgical History:  Procedure Laterality Date  . CESAREAN SECTION       Current Meds  Medication Sig  . Accu-Chek FastClix Lancets MISC Use 1 each 2 (two) times daily. Use as instructed.  Marland Kitchen albuterol (PROVENTIL) (2.5 MG/3ML) 0.083% nebulizer solution Inhale contents fo 1 vial in nebulizer every 4 hours if needed  . albuterol (VENTOLIN HFA) 108 (90 Base) MCG/ACT inhaler Inhale 2 puffs into the lungs every 4 (four) hours as needed for wheezing or shortness of breath.  Marland Kitchen amLODipine (NORVASC) 10 MG tablet Take 1 tablet (10 mg total) by mouth daily.  Marland Kitchen amoxicillin-clavulanate (AUGMENTIN) 875-125 MG tablet Take 1 tablet by mouth 2 (two) times daily.  Jearl Klinefelter ELLIPTA 62.5-25 MCG/INH AEPB Inhale 1 puff into the lungs daily.  Marland Kitchen atorvastatin (LIPITOR) 80 MG tablet Take 1 tablet (80 mg total) by mouth daily.  . calcium carbonate (OS-CAL - DOSED IN MG OF ELEMENTAL CALCIUM) 1250 (500 Ca) MG tablet Take 1 tablet by mouth.  . Cholecalciferol (VITAMIN D3) 50 MCG (2000 UT) TABS Take 1 capsule by mouth daily.  . fluconazole (DIFLUCAN) 150 MG tablet Take 150 mg by mouth as needed.  . fluticasone (FLONASE) 50 MCG/ACT nasal spray Place into both nostrils daily.  . fluticasone-salmeterol (ADVAIR HFA) 115-21 MCG/ACT inhaler Inhale 2 puffs into the lungs 2 (two) times daily as needed (wheezing).   Marland Kitchen glucose blood (ACCU-CHEK AVIVA PLUS) test  strip TEST BLOOD SUGAR ONE TO TWO TIMES A DAY as directed  . HYDROcodone-acetaminophen (NORCO/VICODIN) 5-325 MG tablet Take one tab po q 4 hrs prn pain  . ibuprofen (ADVIL,MOTRIN) 800 MG tablet Take 800 mg by mouth 3 (three) times daily.   . insulin aspart (NOVOLOG) 100 UNIT/ML injection Inject 14-20 Units into the skin 3 (three) times daily before meals.  . insulin glargine (LANTUS) 100 UNIT/ML injection Inject 80 Units into the skin at  bedtime.  . Insulin Pen Needle (BD PEN NEEDLE NANO U/F) 32G X 4 MM MISC ok to sub any brand or size needle preferred by insurance/patient, use up to 5x/day, dx=associated dx  . liraglutide (VICTOZA) 18 MG/3ML SOPN Inject 1.8 mg into the skin daily.   Marland Kitchen. lisinopril (ZESTRIL) 20 MG tablet TAKE 1 TABLET BY MOUTH EVERY DAY  . metFORMIN (GLUCOPHAGE) 1000 MG tablet Take 1,000 mg by mouth 2 (two) times daily with a meal.   . nicotine (NICODERM CQ - DOSED IN MG/24 HR) 7 mg/24hr patch Place 1 patch (7 mg total) onto the skin daily.  Marland Kitchen. nystatin cream (MYCOSTATIN) Apply 1 application topically 2 (two) times daily.  Marland Kitchen. omeprazole (PRILOSEC) 40 MG capsule Take by mouth.  . predniSONE (STERAPRED UNI-PAK 21 TAB) 10 MG (21) TBPK tablet Take as package instructions.  . SSD 1 % cream Apply 1 application topically daily. For rash  . Vitamin D, Ergocalciferol, (DRISDOL) 1.25 MG (50000 UNIT) CAPS capsule Take 1 capsule (50,000 Units total) by mouth every 7 (seven) days.     Allergies:   Morphine and related, Other, Oxycodone-acetaminophen, Latex, and Morphine   ROS:   Please see the history of present illness.    Review of Systems  Constitutional: Negative for chills and fever.  HENT: Positive for congestion. Negative for hearing loss, sinus pain and sore throat.   Eyes: Negative for pain and redness.  Respiratory: Positive for cough and shortness of breath.   Cardiovascular: Negative for chest pain and palpitations.  Gastrointestinal: Negative for nausea and vomiting.  Genitourinary: Negative for dysuria and hematuria.  Musculoskeletal: Negative for myalgias.  Neurological: Negative for dizziness, focal weakness and loss of consciousness.    Labs/Other Tests and Data Reviewed:    Recent Labs: 11/11/2020: ALT 14; BUN 13; Creatinine, Ser 0.95; Hemoglobin 14.6; Platelets 253; Potassium 4.2; Sodium 140; TSH 2.640   Recent Lipid Panel Lab Results  Component Value Date/Time   CHOL 175 11/11/2020 08:17 AM    TRIG 159 (H) 11/11/2020 08:17 AM   HDL 45 11/11/2020 08:17 AM   CHOLHDL 3.9 11/11/2020 08:17 AM   LDLCALC 102 (H) 11/11/2020 08:17 AM    Wt Readings from Last 3 Encounters:  11/19/20 219 lb (99.3 kg)  11/10/20 219 lb 1.9 oz (99.4 kg)  08/23/20 209 lb 3.2 oz (94.9 kg)      ASSESSMENT & PLAN:    COPD exacerbation Worse cough and dyspnea/wheezing Sterapred prescribed Augmentin for possible acute bronchitis and/or sinusitis Anoro and Albuteol (PRN) Advised to get COVID RTPCR  Acute sinusitis Sinus drainage and nasal congestion for 5 days Prescribed Augmentin  Time:   Today, I have spent 22 minutes reviewing the chart, including problem list, medications, and with the patient with telehealth technology discussing the above problems.   Medication Adjustments/Labs and Tests Ordered: Current medicines are reviewed at length with the patient today.  Concerns regarding medicines are outlined above.   Tests Ordered: No orders of the defined types were placed in this encounter.  Medication Changes: Meds ordered this encounter  Medications  . amoxicillin-clavulanate (AUGMENTIN) 875-125 MG tablet    Sig: Take 1 tablet by mouth 2 (two) times daily.    Dispense:  20 tablet    Refill:  0  . predniSONE (STERAPRED UNI-PAK 21 TAB) 10 MG (21) TBPK tablet    Sig: Take as package instructions.    Dispense:  1 each    Refill:  0     Note: This dictation was prepared with Dragon dictation along with smaller phrase technology. Similar sounding words can be transcribed inadequately or may not be corrected upon review. Any transcriptional errors that result from this process are unintentional.      Disposition:  Follow up  Signed, Lindell Spar, MD  11/19/2020 9:51 AM     [place]

## 2020-11-19 NOTE — Telephone Encounter (Signed)
Pt was seen for appt today

## 2020-11-19 NOTE — Patient Instructions (Signed)
Please start taking Augmentin and Prednisone as prescribed.  Please continue to use Anoro and as needed Albuterol for shortness of breath/wheezing as prescribed.  Please go to ER if your shortness of breath does not get better with Albuterol inhaler.  You are advised to get COVID RTPCR.

## 2020-11-23 ENCOUNTER — Encounter (INDEPENDENT_AMBULATORY_CARE_PROVIDER_SITE_OTHER): Payer: Self-pay | Admitting: *Deleted

## 2020-11-23 ENCOUNTER — Other Ambulatory Visit: Payer: Self-pay

## 2020-11-23 ENCOUNTER — Other Ambulatory Visit: Payer: Self-pay | Admitting: *Deleted

## 2020-11-23 DIAGNOSIS — Z1211 Encounter for screening for malignant neoplasm of colon: Secondary | ICD-10-CM

## 2020-11-23 DIAGNOSIS — Z20822 Contact with and (suspected) exposure to covid-19: Secondary | ICD-10-CM

## 2020-11-23 LAB — COLOGUARD: COLOGUARD: POSITIVE — AB

## 2020-11-24 LAB — SARS-COV-2, NAA 2 DAY TAT

## 2020-11-24 LAB — NOVEL CORONAVIRUS, NAA: SARS-CoV-2, NAA: NOT DETECTED

## 2020-12-06 ENCOUNTER — Telehealth: Payer: Self-pay | Admitting: "Endocrinology

## 2020-12-06 NOTE — Telephone Encounter (Signed)
Pt called to sch follow up with Dr Dorris Fetch. She has not been seen since 02/2020. Pt states Dr. Posey Pronto just did blood work and she would like to know if those labs will work for her to come here for a follow up.

## 2020-12-06 NOTE — Telephone Encounter (Signed)
Pt had TSH,T4,A1c,lipid,vitamin D, and CMP on 11/11/20.

## 2020-12-06 NOTE — Telephone Encounter (Signed)
Yes, these are adequate for her to follow up.

## 2020-12-07 DIAGNOSIS — G894 Chronic pain syndrome: Secondary | ICD-10-CM | POA: Diagnosis not present

## 2020-12-07 DIAGNOSIS — M5432 Sciatica, left side: Secondary | ICD-10-CM | POA: Diagnosis not present

## 2020-12-07 DIAGNOSIS — M545 Low back pain, unspecified: Secondary | ICD-10-CM | POA: Diagnosis not present

## 2020-12-07 DIAGNOSIS — R202 Paresthesia of skin: Secondary | ICD-10-CM | POA: Diagnosis not present

## 2020-12-07 DIAGNOSIS — M5431 Sciatica, right side: Secondary | ICD-10-CM | POA: Diagnosis not present

## 2020-12-07 DIAGNOSIS — M542 Cervicalgia: Secondary | ICD-10-CM | POA: Diagnosis not present

## 2020-12-07 DIAGNOSIS — E1142 Type 2 diabetes mellitus with diabetic polyneuropathy: Secondary | ICD-10-CM | POA: Diagnosis not present

## 2020-12-07 DIAGNOSIS — Z79891 Long term (current) use of opiate analgesic: Secondary | ICD-10-CM | POA: Diagnosis not present

## 2020-12-07 DIAGNOSIS — G8929 Other chronic pain: Secondary | ICD-10-CM | POA: Diagnosis not present

## 2020-12-09 ENCOUNTER — Emergency Department (HOSPITAL_BASED_OUTPATIENT_CLINIC_OR_DEPARTMENT_OTHER): Payer: Medicare Other

## 2020-12-09 ENCOUNTER — Encounter (HOSPITAL_COMMUNITY): Payer: Self-pay | Admitting: Cardiology

## 2020-12-09 ENCOUNTER — Emergency Department (HOSPITAL_COMMUNITY): Payer: Medicare Other

## 2020-12-09 ENCOUNTER — Encounter: Payer: Self-pay | Admitting: Internal Medicine

## 2020-12-09 ENCOUNTER — Emergency Department (HOSPITAL_COMMUNITY)
Admission: EM | Admit: 2020-12-09 | Discharge: 2020-12-09 | Disposition: A | Discharge status: Home / Self Care | Payer: Medicare Other | Attending: Emergency Medicine | Admitting: Emergency Medicine

## 2020-12-09 ENCOUNTER — Ambulatory Visit (INDEPENDENT_AMBULATORY_CARE_PROVIDER_SITE_OTHER): Payer: Medicare Other | Admitting: Internal Medicine

## 2020-12-09 ENCOUNTER — Other Ambulatory Visit: Payer: Self-pay

## 2020-12-09 VITALS — BP 170/93 | HR 102 | Temp 97.8°F | Resp 20 | Ht 60.0 in | Wt 240.4 lb

## 2020-12-09 DIAGNOSIS — I5030 Unspecified diastolic (congestive) heart failure: Secondary | ICD-10-CM | POA: Insufficient documentation

## 2020-12-09 DIAGNOSIS — R600 Localized edema: Secondary | ICD-10-CM | POA: Diagnosis not present

## 2020-12-09 DIAGNOSIS — Z7984 Long term (current) use of oral hypoglycemic drugs: Secondary | ICD-10-CM | POA: Diagnosis not present

## 2020-12-09 DIAGNOSIS — Z91040 Latex allergy status: Secondary | ICD-10-CM | POA: Insufficient documentation

## 2020-12-09 DIAGNOSIS — J9811 Atelectasis: Secondary | ICD-10-CM | POA: Diagnosis not present

## 2020-12-09 DIAGNOSIS — J45909 Unspecified asthma, uncomplicated: Secondary | ICD-10-CM | POA: Diagnosis not present

## 2020-12-09 DIAGNOSIS — I5043 Acute on chronic combined systolic (congestive) and diastolic (congestive) heart failure: Secondary | ICD-10-CM | POA: Insufficient documentation

## 2020-12-09 DIAGNOSIS — J449 Chronic obstructive pulmonary disease, unspecified: Secondary | ICD-10-CM | POA: Diagnosis not present

## 2020-12-09 DIAGNOSIS — I517 Cardiomegaly: Secondary | ICD-10-CM | POA: Diagnosis not present

## 2020-12-09 DIAGNOSIS — J811 Chronic pulmonary edema: Secondary | ICD-10-CM | POA: Diagnosis not present

## 2020-12-09 DIAGNOSIS — J441 Chronic obstructive pulmonary disease with (acute) exacerbation: Secondary | ICD-10-CM | POA: Diagnosis not present

## 2020-12-09 DIAGNOSIS — R0602 Shortness of breath: Secondary | ICD-10-CM

## 2020-12-09 DIAGNOSIS — I10 Essential (primary) hypertension: Secondary | ICD-10-CM

## 2020-12-09 DIAGNOSIS — Z7951 Long term (current) use of inhaled steroids: Secondary | ICD-10-CM | POA: Insufficient documentation

## 2020-12-09 DIAGNOSIS — J90 Pleural effusion, not elsewhere classified: Secondary | ICD-10-CM | POA: Diagnosis not present

## 2020-12-09 DIAGNOSIS — R053 Chronic cough: Secondary | ICD-10-CM | POA: Diagnosis not present

## 2020-12-09 DIAGNOSIS — I110 Hypertensive heart disease with heart failure: Secondary | ICD-10-CM | POA: Insufficient documentation

## 2020-12-09 DIAGNOSIS — R0902 Hypoxemia: Secondary | ICD-10-CM

## 2020-12-09 DIAGNOSIS — I5031 Acute diastolic (congestive) heart failure: Secondary | ICD-10-CM | POA: Diagnosis not present

## 2020-12-09 DIAGNOSIS — J9601 Acute respiratory failure with hypoxia: Secondary | ICD-10-CM

## 2020-12-09 DIAGNOSIS — Z87891 Personal history of nicotine dependence: Secondary | ICD-10-CM | POA: Diagnosis not present

## 2020-12-09 DIAGNOSIS — I509 Heart failure, unspecified: Secondary | ICD-10-CM | POA: Diagnosis not present

## 2020-12-09 DIAGNOSIS — Z794 Long term (current) use of insulin: Secondary | ICD-10-CM | POA: Insufficient documentation

## 2020-12-09 DIAGNOSIS — R609 Edema, unspecified: Secondary | ICD-10-CM

## 2020-12-09 DIAGNOSIS — Z79899 Other long term (current) drug therapy: Secondary | ICD-10-CM | POA: Insufficient documentation

## 2020-12-09 DIAGNOSIS — E119 Type 2 diabetes mellitus without complications: Secondary | ICD-10-CM | POA: Diagnosis not present

## 2020-12-09 LAB — BASIC METABOLIC PANEL WITH GFR
Anion gap: 9 (ref 5–15)
BUN: 18 mg/dL (ref 6–20)
CO2: 28 mmol/L (ref 22–32)
Calcium: 9 mg/dL (ref 8.9–10.3)
Chloride: 102 mmol/L (ref 98–111)
Creatinine, Ser: 0.86 mg/dL (ref 0.44–1.00)
GFR, Estimated: >60 mL/min
Glucose, Bld: 92 mg/dL (ref 70–99)
Potassium: 4 mmol/L (ref 3.5–5.1)
Sodium: 139 mmol/L (ref 135–145)

## 2020-12-09 LAB — ECHOCARDIOGRAM COMPLETE
AR max vel: 2.18 cm2
AV Area VTI: 2 cm2
AV Area mean vel: 1.71 cm2
AV Mean grad: 7.6 mmHg
AV Peak grad: 12.1 mmHg
Ao pk vel: 1.74 m/s
Area-P 1/2: 4.26 cm2
Height: 60 in
S' Lateral: 2.93 cm
Weight: 3846.4 [oz_av]

## 2020-12-09 LAB — TROPONIN I (HIGH SENSITIVITY): Troponin I (High Sensitivity): 10 ng/L

## 2020-12-09 LAB — CBC
HCT: 50.4 % — ABNORMAL HIGH (ref 36.0–46.0)
Hemoglobin: 14.7 g/dL (ref 12.0–15.0)
MCH: 24.2 pg — ABNORMAL LOW (ref 26.0–34.0)
MCHC: 29.2 g/dL — ABNORMAL LOW (ref 30.0–36.0)
MCV: 83 fL (ref 80.0–100.0)
Platelets: 281 10*3/uL (ref 150–400)
RBC: 6.07 MIL/uL — ABNORMAL HIGH (ref 3.87–5.11)
RDW: 15.6 % — ABNORMAL HIGH (ref 11.5–15.5)
WBC: 11 10*3/uL — ABNORMAL HIGH (ref 4.0–10.5)
nRBC: 0 % (ref 0.0–0.2)

## 2020-12-09 LAB — MAGNESIUM: Magnesium: 1.8 mg/dL (ref 1.7–2.4)

## 2020-12-09 LAB — BRAIN NATRIURETIC PEPTIDE: B Natriuretic Peptide: 73 pg/mL (ref 0.0–100.0)

## 2020-12-09 MED ORDER — FUROSEMIDE 20 MG PO TABS: 20.0000 mg | ORAL_TABLET | Freq: Every day | ORAL | 0 refills | Status: DC

## 2020-12-09 MED ORDER — LISINOPRIL 30 MG PO TABS: 30.0000 mg | ORAL_TABLET | Freq: Every day | ORAL | 0 refills | Status: DC

## 2020-12-09 MED ORDER — FUROSEMIDE 10 MG/ML IJ SOLN
40.0000 mg | Freq: Once | INTRAMUSCULAR | Status: AC
  Administered 2020-12-09: 40 mg via INTRAVENOUS
  Filled 2020-12-09: qty 4

## 2020-12-09 MED ORDER — PERFLUTREN LIPID MICROSPHERE
1.0000 mL | INTRAVENOUS | Status: AC | PRN
  Administered 2020-12-09: 1 mL via INTRAVENOUS
  Filled 2020-12-09: qty 10

## 2020-12-09 NOTE — Progress Notes (Signed)
Acute Office Visit  Subjective:    Patient ID: Christine Weaver, female    DOB: 1966/10/26, 55 y.o.   MRN: [id number]  Chief Complaint  Patient presents with  . Leg Swelling    Pt is having leg swelling x3 weeks sob x3 weeks     HPI Patient is in today for evaluation of shortness of breath and leg swelling. She has been having dyspnea for last 3 weeks. She was given steroids in the previous visit for COPD exacerbation, which improved her symptoms. Today, she presented with dyspnea, O2 sat was in lower 80s upon initial evaluation. She was placed on 2 l O2, which improved her O2 sat to 93%. She is not able to speak in full sentences. She has been using her Anoro and PRN albuterol. She also reports having chest pain about 2-3 days ago, which occurred after exertion. She described pain as sharp and worse with deep breathing. Denies any chest pain today. Her leg swelling has been getting worse recently as well.  Past Medical History:  Diagnosis Date  . Asthma   . COPD (chronic obstructive pulmonary disease) ([place])    Phreesia 11/07/2020  . Diabetes mellitus without complication ([place])   . Hyperlipidemia   . Hypertension     Past Surgical History:  Procedure Laterality Date  . CESAREAN SECTION      Family History  Problem Relation Age of Onset  . Thyroid disease Mother   . Hypertension Mother   . Heart attack Mother   . Stroke Mother   . Heart failure Mother   . Diabetes Mother   . Diabetes Father   . Heart attack Father   . Stroke Father   . Kidney disease Father   . Heart failure Father     Social History   Socioeconomic History  . Marital status: Married    Spouse name: Not on file  . Number of children: Not on file  . Years of education: Not on file  . Highest education level: Not on file  Occupational History  . Not on file  Tobacco Use  . Smoking status: Former Research scientist (life sciences)  . Smokeless tobacco: Never Used  Vaping Use  . Vaping Use: Never used  Substance and Sexual  Activity  . Alcohol use: Not on file  . Drug use: Not on file  . Sexual activity: Not on file  Other Topics Concern  . Not on file  Social History Narrative  . Not on file   Social Determinants of Health   Financial Resource Strain: Not on file  Food Insecurity: Not on file  Transportation Needs: Not on file  Physical Activity: Not on file  Stress: Not on file  Social Connections: Not on file  Intimate Partner Violence: Not on file    Outpatient Medications Prior to Visit  Medication Sig Dispense Refill  . Accu-Chek FastClix Lancets MISC Use 1 each 2 (two) times daily. Use as instructed.    Marland Kitchen albuterol (PROVENTIL) (2.5 MG/3ML) 0.083% nebulizer solution Inhale contents fo 1 vial in nebulizer every 4 hours if needed    . albuterol (VENTOLIN HFA) 108 (90 Base) MCG/ACT inhaler Inhale 2 puffs into the lungs every 4 (four) hours as needed for wheezing or shortness of breath. 18 g 5  . amLODipine (NORVASC) 10 MG tablet Take 1 tablet (10 mg total) by mouth daily. 90 tablet 1  . ANORO ELLIPTA 62.5-25 MCG/INH AEPB Inhale 1 puff into the lungs daily. 14 each 5  .  atorvastatin (LIPITOR) 80 MG tablet Take 1 tablet (80 mg total) by mouth daily. 90 tablet 1  . calcium carbonate (OS-CAL - DOSED IN MG OF ELEMENTAL CALCIUM) 1250 (500 Ca) MG tablet Take 1 tablet by mouth.    . Cholecalciferol (VITAMIN D3) 50 MCG (2000 UT) TABS Take 1 capsule by mouth daily.    . fluconazole (DIFLUCAN) 150 MG tablet Take 150 mg by mouth as needed.    . fluticasone (FLONASE) 50 MCG/ACT nasal spray Place into both nostrils daily.    . fluticasone-salmeterol (ADVAIR HFA) 115-21 MCG/ACT inhaler Inhale 2 puffs into the lungs 2 (two) times daily as needed (wheezing).     Marland Kitchen glucose blood (ACCU-CHEK AVIVA PLUS) test strip TEST BLOOD SUGAR ONE TO TWO TIMES A DAY as directed    . HYDROcodone-acetaminophen (NORCO/VICODIN) 5-325 MG tablet Take one tab po q 4 hrs prn pain 10 tablet 0  . ibuprofen (ADVIL,MOTRIN) 800 MG tablet Take  800 mg by mouth 3 (three) times daily.     . insulin aspart (NOVOLOG) 100 UNIT/ML injection Inject 14-20 Units into the skin 3 (three) times daily before meals. 10 mL 2  . insulin glargine (LANTUS) 100 UNIT/ML injection Inject 80 Units into the skin at bedtime.    . Insulin Pen Needle (BD PEN NEEDLE NANO U/F) 32G X 4 MM MISC ok to sub any brand or size needle preferred by insurance/patient, use up to 5x/day, dx=associated dx    . liraglutide (VICTOZA) 18 MG/3ML SOPN Inject 1.8 mg into the skin daily.     Marland Kitchen lisinopril (ZESTRIL) 20 MG tablet TAKE 1 TABLET BY MOUTH EVERY DAY 90 tablet 1  . metFORMIN (GLUCOPHAGE) 1000 MG tablet Take 1,000 mg by mouth 2 (two) times daily with a meal.     . nicotine (NICODERM CQ - DOSED IN MG/24 HR) 7 mg/24hr patch Place 1 patch (7 mg total) onto the skin daily. 28 patch 0  . nystatin cream (MYCOSTATIN) Apply 1 application topically 2 (two) times daily.    Marland Kitchen omeprazole (PRILOSEC) 40 MG capsule Take by mouth.    . SSD 1 % cream Apply 1 application topically daily. For rash  0  . Vitamin D, Ergocalciferol, (DRISDOL) 1.25 MG (50000 UNIT) CAPS capsule Take 1 capsule (50,000 Units total) by mouth every 7 (seven) days. 12 capsule 1  . amoxicillin-clavulanate (AUGMENTIN) 875-125 MG tablet Take 1 tablet by mouth 2 (two) times daily. (Patient not taking: Reported on 12/09/2020) 20 tablet 0  . predniSONE (STERAPRED UNI-PAK 21 TAB) 10 MG (21) TBPK tablet Take as package instructions. (Patient not taking: Reported on 12/09/2020) 1 each 0   No facility-administered medications prior to visit.    Allergies  Allergen Reactions  . Morphine And Related   . Other Other (See Comments)    Bells Palsy   . Oxycodone-Acetaminophen Other (See Comments)    Constipates patient   . Latex Itching and Other (See Comments)    Skin "cracks" open   . Morphine Itching and Other (See Comments)    Review of Systems  Constitutional: Negative for chills and fever.  HENT: Negative for  congestion, sinus pressure, sinus pain and sore throat.   Eyes: Negative for pain and discharge.  Respiratory: Positive for shortness of breath and wheezing. Negative for cough.   Cardiovascular: Positive for chest pain. Negative for palpitations.  Gastrointestinal: Negative for abdominal pain, constipation, diarrhea, nausea and vomiting.  Endocrine: Negative for polydipsia and polyuria.  Genitourinary: Negative for dysuria and hematuria.  Musculoskeletal: Positive for arthralgias and back pain. Negative for neck pain and neck stiffness.  Skin: Negative for rash.  Neurological: Negative for dizziness and weakness.  Psychiatric/Behavioral: Negative for agitation and behavioral problems.       Objective:    Physical Exam Vitals reviewed.  Constitutional:      General: She is not in acute distress.    Appearance: She is not diaphoretic.  HENT:     Head: Normocephalic and atraumatic.     Nose: Nose normal.     Mouth/Throat:     Mouth: Mucous membranes are moist.  Eyes:     General: No scleral icterus.    Extraocular Movements: Extraocular movements intact.     Pupils: Pupils are equal, round, and reactive to light.  Cardiovascular:     Rate and Rhythm: Normal rate and regular rhythm.     Pulses: Normal pulses.     Heart sounds: Normal heart sounds. No murmur heard.   Pulmonary:     Effort: Respiratory distress present.     Breath sounds: Wheezing present. No rales.     Comments: Decreased breath sounds in the lower lung fields b/l Musculoskeletal:     Cervical back: Neck supple. No tenderness.     Right lower leg: No edema.     Left lower leg: No edema.  Skin:    General: Skin is warm.     Findings: No rash.  Neurological:     General: No focal deficit present.     Mental Status: She is alert and oriented to person, place, and time.  Psychiatric:        Mood and Affect: Mood normal.        Behavior: Behavior normal.     BP (!) 170/93 (BP Location: Right Arm,  Patient Position: Sitting, Cuff Size: Normal)   Pulse (!) 102   Temp 97.8 F (36.6 C) (Oral)   Resp 20   Ht 5' (1.524 m)   Wt 240 lb 6.4 oz (109 kg)   SpO2 93% Comment: 2liters of oxygen  BMI 46.95 kg/m  Wt Readings from Last 3 Encounters:  12/09/20 240 lb 6.4 oz (109 kg)  11/19/20 219 lb (99.3 kg)  11/10/20 219 lb 1.9 oz (99.4 kg)    Health Maintenance Due  Topic Date Due  . Hepatitis C Screening  Never done  . FOOT EXAM  Never done  . OPHTHALMOLOGY EXAM  Never done  . HIV Screening  Never done  . PAP SMEAR-Modifier  Never done  . COLONOSCOPY (Pts 45-87yrs Insurance coverage will need to be confirmed)  Never done  . MAMMOGRAM  Never done  . COVID-19 Vaccine (2 - Moderna 3-dose series) 12/11/2020    There are no preventive care reminders to display for this patient.   Lab Results  Component Value Date   TSH 2.640 11/11/2020   Lab Results  Component Value Date   WBC 9.5 11/11/2020   HGB 14.6 11/11/2020   HCT 47.3 (H) 11/11/2020   MCV 81 11/11/2020   PLT 253 11/11/2020   Lab Results  Component Value Date   NA 140 11/11/2020   K 4.2 11/11/2020   CO2 26 11/11/2020   GLUCOSE 130 (H) 11/11/2020   BUN 13 11/11/2020   CREATININE 0.95 11/11/2020   BILITOT 0.3 11/11/2020   ALKPHOS 116 11/11/2020   AST 10 11/11/2020   ALT 14 11/11/2020   PROT 5.9 (L) 11/11/2020   ALBUMIN 3.5 (L) 11/11/2020   CALCIUM 9.1  11/11/2020   ANIONGAP 9 06/19/2018   Lab Results  Component Value Date   CHOL 175 11/11/2020   Lab Results  Component Value Date   HDL 45 11/11/2020   Lab Results  Component Value Date   LDLCALC 102 (H) 11/11/2020   Lab Results  Component Value Date   TRIG 159 (H) 11/11/2020   Lab Results  Component Value Date   CHOLHDL 3.9 11/11/2020   Lab Results  Component Value Date   HGBA1C 9.5 (H) 11/11/2020       Assessment & Plan:   Acute hypoxic respiratory failure COPD exacerbation O2 sat around 80% upon initial evaluation Improved with 2 l  O2 Advised to go to ER for urgent evaluation On Anoro and PRN Albuterol Was recently treated with steroids for COPD exacerbation  Leg swelling Unclear etiology Could be due to overt CHF exacerbation vs PAH  Uncontrolled HTN Could be due to COPD exacerbation On Amlodipine and Lisinopril   Discussed with ER Physician about the patient.  No orders of the defined types were placed in this encounter.    Lindell Spar, MD

## 2020-12-09 NOTE — Progress Notes (Signed)
*  PRELIMINARY RESULTS* Echocardiogram 2D Echocardiogram has been performed.  Leavy Cella 12/09/2020, 12:54 PM

## 2020-12-09 NOTE — Patient Instructions (Signed)
Please go to ER immediately for evaluation of COPD exacerbation and chest pain.

## 2020-12-09 NOTE — Progress Notes (Deleted)
Acute Office Visit  Subjective:    Patient ID: Christine Weaver, female    DOB: [birth year], 55 y.o.   MRN: [id number]  Chief Complaint  Patient presents with  . Leg Swelling    Pt is having leg swelling x3 weeks sob x3 weeks     HPI Patient is in today for ***  Past Medical History:  Diagnosis Date  . Asthma   . COPD (chronic obstructive pulmonary disease) ([place])    Phreesia 11/07/2020  . Diabetes mellitus without complication ([place])   . Hyperlipidemia   . Hypertension     Past Surgical History:  Procedure Laterality Date  . CESAREAN SECTION      Family History  Problem Relation Age of Onset  . Thyroid disease Mother   . Hypertension Mother   . Heart attack Mother   . Stroke Mother   . Heart failure Mother   . Diabetes Mother   . Diabetes Father   . Heart attack Father   . Stroke Father   . Kidney disease Father   . Heart failure Father     Social History   Socioeconomic History  . Marital status: Married    Spouse name: Not on file  . Number of children: Not on file  . Years of education: Not on file  . Highest education level: Not on file  Occupational History  . Not on file  Tobacco Use  . Smoking status: Former Research scientist (life sciences)  . Smokeless tobacco: Never Used  Vaping Use  . Vaping Use: Never used  Substance and Sexual Activity  . Alcohol use: Not on file  . Drug use: Not on file  . Sexual activity: Not on file  Other Topics Concern  . Not on file  Social History Narrative  . Not on file   Social Determinants of Health   Financial Resource Strain: Not on file  Food Insecurity: Not on file  Transportation Needs: Not on file  Physical Activity: Not on file  Stress: Not on file  Social Connections: Not on file  Intimate Partner Violence: Not on file    Outpatient Medications Prior to Visit  Medication Sig Dispense Refill  . Accu-Chek FastClix Lancets MISC Use 1 each 2 (two) times daily. Use as instructed.    Marland Kitchen albuterol (PROVENTIL) (2.5 MG/3ML)  0.083% nebulizer solution Inhale contents fo 1 vial in nebulizer every 4 hours if needed    . albuterol (VENTOLIN HFA) 108 (90 Base) MCG/ACT inhaler Inhale 2 puffs into the lungs every 4 (four) hours as needed for wheezing or shortness of breath. 18 g 5  . amLODipine (NORVASC) 10 MG tablet Take 1 tablet (10 mg total) by mouth daily. 90 tablet 1  . ANORO ELLIPTA 62.5-25 MCG/INH AEPB Inhale 1 puff into the lungs daily. 14 each 5  . atorvastatin (LIPITOR) 80 MG tablet Take 1 tablet (80 mg total) by mouth daily. 90 tablet 1  . calcium carbonate (OS-CAL - DOSED IN MG OF ELEMENTAL CALCIUM) 1250 (500 Ca) MG tablet Take 1 tablet by mouth.    . Cholecalciferol (VITAMIN D3) 50 MCG (2000 UT) TABS Take 1 capsule by mouth daily.    . fluconazole (DIFLUCAN) 150 MG tablet Take 150 mg by mouth as needed.    . fluticasone (FLONASE) 50 MCG/ACT nasal spray Place into both nostrils daily.    . fluticasone-salmeterol (ADVAIR HFA) 115-21 MCG/ACT inhaler Inhale 2 puffs into the lungs 2 (two) times daily as needed (wheezing).     Marland Kitchen  glucose blood (ACCU-CHEK AVIVA PLUS) test strip TEST BLOOD SUGAR ONE TO TWO TIMES A DAY as directed    . HYDROcodone-acetaminophen (NORCO/VICODIN) 5-325 MG tablet Take one tab po q 4 hrs prn pain 10 tablet 0  . ibuprofen (ADVIL,MOTRIN) 800 MG tablet Take 800 mg by mouth 3 (three) times daily.     . insulin aspart (NOVOLOG) 100 UNIT/ML injection Inject 14-20 Units into the skin 3 (three) times daily before meals. 10 mL 2  . insulin glargine (LANTUS) 100 UNIT/ML injection Inject 80 Units into the skin at bedtime.    . Insulin Pen Needle (BD PEN NEEDLE NANO U/F) 32G X 4 MM MISC ok to sub any brand or size needle preferred by insurance/patient, use up to 5x/day, dx=associated dx    . liraglutide (VICTOZA) 18 MG/3ML SOPN Inject 1.8 mg into the skin daily.     Marland Kitchen lisinopril (ZESTRIL) 20 MG tablet TAKE 1 TABLET BY MOUTH EVERY DAY 90 tablet 1  . metFORMIN (GLUCOPHAGE) 1000 MG tablet Take 1,000 mg by  mouth 2 (two) times daily with a meal.     . nicotine (NICODERM CQ - DOSED IN MG/24 HR) 7 mg/24hr patch Place 1 patch (7 mg total) onto the skin daily. 28 patch 0  . nystatin cream (MYCOSTATIN) Apply 1 application topically 2 (two) times daily.    Marland Kitchen omeprazole (PRILOSEC) 40 MG capsule Take by mouth.    . SSD 1 % cream Apply 1 application topically daily. For rash  0  . Vitamin D, Ergocalciferol, (DRISDOL) 1.25 MG (50000 UNIT) CAPS capsule Take 1 capsule (50,000 Units total) by mouth every 7 (seven) days. 12 capsule 1  . amoxicillin-clavulanate (AUGMENTIN) 875-125 MG tablet Take 1 tablet by mouth 2 (two) times daily. (Patient not taking: Reported on 12/09/2020) 20 tablet 0  . predniSONE (STERAPRED UNI-PAK 21 TAB) 10 MG (21) TBPK tablet Take as package instructions. (Patient not taking: Reported on 12/09/2020) 1 each 0   No facility-administered medications prior to visit.    Allergies  Allergen Reactions  . Morphine And Related   . Other Other (See Comments)    Bells Palsy   . Oxycodone-Acetaminophen Other (See Comments)    Constipates patient   . Latex Itching and Other (See Comments)    Skin "cracks" open   . Morphine Itching and Other (See Comments)    Review of Systems     Objective:    Physical Exam  BP (!) 170/93 (BP Location: Right Arm, Patient Position: Sitting, Cuff Size: Normal)   Pulse (!) 102   Temp 97.8 F (36.6 C) (Oral)   Resp 20   Ht 5' (1.524 m)   Wt 240 lb 6.4 oz (109 kg)   SpO2 93% Comment: 2liters of oxygen  BMI 46.95 kg/m  Wt Readings from Last 3 Encounters:  12/09/20 240 lb 6.4 oz (109 kg)  11/19/20 219 lb (99.3 kg)  11/10/20 219 lb 1.9 oz (99.4 kg)    Health Maintenance Due  Topic Date Due  . Hepatitis C Screening  Never done  . FOOT EXAM  Never done  . OPHTHALMOLOGY EXAM  Never done  . HIV Screening  Never done  . PAP SMEAR-Modifier  Never done  . COLONOSCOPY (Pts 45-39yrs Insurance coverage will need to be confirmed)  Never done  .  MAMMOGRAM  Never done  . COVID-19 Vaccine (2 - Moderna 3-dose series) 12/11/2020    There are no preventive care reminders to display for this patient.  Lab Results  Component Value Date   TSH 2.640 11/11/2020   Lab Results  Component Value Date   WBC 9.5 11/11/2020   HGB 14.6 11/11/2020   HCT 47.3 (H) 11/11/2020   MCV 81 11/11/2020   PLT 253 11/11/2020   Lab Results  Component Value Date   NA 140 11/11/2020   K 4.2 11/11/2020   CO2 26 11/11/2020   GLUCOSE 130 (H) 11/11/2020   BUN 13 11/11/2020   CREATININE 0.95 11/11/2020   BILITOT 0.3 11/11/2020   ALKPHOS 116 11/11/2020   AST 10 11/11/2020   ALT 14 11/11/2020   PROT 5.9 (L) 11/11/2020   ALBUMIN 3.5 (L) 11/11/2020   CALCIUM 9.1 11/11/2020   ANIONGAP 9 06/19/2018   Lab Results  Component Value Date   CHOL 175 11/11/2020   Lab Results  Component Value Date   HDL 45 11/11/2020   Lab Results  Component Value Date   LDLCALC 102 (H) 11/11/2020   Lab Results  Component Value Date   TRIG 159 (H) 11/11/2020   Lab Results  Component Value Date   CHOLHDL 3.9 11/11/2020   Lab Results  Component Value Date   HGBA1C 9.5 (H) 11/11/2020       Assessment & Plan:   Problem List Items Addressed This Visit      Respiratory   Chronic obstructive pulmonary disease ([place]) - Primary       No orders of the defined types were placed in this encounter.    Lindell Spar, MD

## 2020-12-09 NOTE — Discharge Instructions (Addendum)
Todays tests are inconclusive as the exact cause of your fluid retention and shortness of breath,  but we suspect it may be early Congestive Heart Failure, although as discussed your labs, chest xray and echocardiogram look good today.  You will need close follow up with cardiology - Call Dr. Nelly Laurence office to arrange this.  In the interim, start the medicine prescribed in addition to the new dose of your lisinopril,  continuing your other blood pressure medications.

## 2020-12-09 NOTE — ED Triage Notes (Signed)
Pt reports SHOB and leg swelling that started January 16th. Pt was seen by PCP today and was told to come to the ER for "low O2." Pt 82% on RA.

## 2020-12-09 NOTE — Consult Note (Signed)
Cardiology Consultation:   Patient ID: Christine Weaver MRN: [id number]; DOB: 1966/02/17  Admit date: 12/09/2020 Date of Consult: 12/09/2020  Primary Care Provider: Lindell Spar, MD [facility] Cardiologist: No primary care provider on file.  [facility] Electrophysiologist:  None    Patient Profile:   Christine Weaver is a 55 y.o. female with a hx of COPD, hypertension, history of hyperlipidemia, history of type 2 diabetes, history of smoking problems, who is being seen today for the evaluation of shortness of breath at the request of ER MD  History of Present Illness:   Christine Weaver is a 55 year old female with above medical problems went to see her primary care MD today.  Patient states she received COVID-19 vaccine about a month ago and she felt sick with shortness of breath and cough.  She was given treatment for exacerbation of COPD with steroids and bronchodilators.  Today she went to see her primary care doctor and noted to have O2 sats in the 80s.  Patient was sent to the emergency room.  Patient is noted to have bilateral pleural effusions with pulmonary vascular congestion.  Patient was given 40 mg of Lasix.  Patient states she has intermittent chest pains.  No palpitations or dizziness.  She discontinued smoking in January 2022.  Patient reports no cough or cold or fever. Patient does not want to stay in the hospital.  She would like to go home.   Past Medical History:  Diagnosis Date  . Asthma   . COPD (chronic obstructive pulmonary disease) ([place])    Phreesia 11/07/2020  . Diabetes mellitus without complication ([place])   . Hyperlipidemia   . Hypertension     Past Surgical History:  Procedure Laterality Date  . CESAREAN SECTION       Home Medications:  Prior to Admission medications   Medication Sig Start Date End Date Taking? Authorizing Provider  albuterol (PROVENTIL) (2.5 MG/3ML) 0.083% nebulizer solution Inhale contents fo 1 vial in nebulizer every 4 hours if  needed 10/25/16  Yes [provider]  albuterol (VENTOLIN HFA) 108 (90 Base) MCG/ACT inhaler Inhale 2 puffs into the lungs every 4 (four) hours as needed for wheezing or shortness of breath. 11/10/20  Yes Lindell Spar, MD  amLODipine (NORVASC) 10 MG tablet Take 1 tablet (10 mg total) by mouth daily. 11/10/20  Yes Patel, Colin Broach, MD  ANORO ELLIPTA 62.5-25 MCG/INH AEPB Inhale 1 puff into the lungs daily. 11/10/20  Yes Lindell Spar, MD  atorvastatin (LIPITOR) 80 MG tablet Take 1 tablet (80 mg total) by mouth daily. 11/10/20  Yes Lindell Spar, MD  Cholecalciferol (VITAMIN D3) 50 MCG (2000 UT) TABS Take 1 capsule by mouth daily.   Yes [provider]  fluconazole (DIFLUCAN) 150 MG tablet Take 150 mg by mouth as needed.   Yes [provider]  fluticasone (FLONASE) 50 MCG/ACT nasal spray Place into both nostrils daily.   Yes [provider]  fluticasone-salmeterol (ADVAIR HFA) 115-21 MCG/ACT inhaler Inhale 2 puffs into the lungs 2 (two) times daily as needed (wheezing).  03/18/12  Yes [provider]  glucose blood (ACCU-CHEK AVIVA PLUS) test strip TEST BLOOD SUGAR ONE TO TWO TIMES A DAY as directed 10/27/12  Yes [provider]  HYDROcodone-acetaminophen (NORCO/VICODIN) 5-325 MG tablet Take one tab po q 4 hrs prn pain 06/19/18  Yes Triplett, Tammy, PA-C  ibuprofen (ADVIL,MOTRIN) 800 MG tablet Take 800 mg by mouth 3 (three) times daily.  03/18/12  Yes [provider]  insulin aspart (NOVOLOG) 100 UNIT/ML injection Inject 14-20 Units into the skin 3 (three) times daily before meals. 11/10/20  Yes Lindell Spar, MD  insulin glargine (LANTUS) 100 UNIT/ML injection Inject 80 Units into the skin at bedtime.   Yes [provider]  liraglutide (VICTOZA) 18 MG/3ML SOPN Inject 1.8 mg into the skin daily.  05/31/17  Yes [provider]  lisinopril (ZESTRIL) 20 MG tablet TAKE 1 TABLET BY MOUTH EVERY DAY 11/11/20  Yes Lindell Spar, MD   metFORMIN (GLUCOPHAGE) 1000 MG tablet Take 1,000 mg by mouth 2 (two) times daily with a meal.  10/27/12  Yes [provider]  nystatin cream (MYCOSTATIN) Apply 1 application topically 2 (two) times daily.   Yes [provider]  omeprazole (PRILOSEC) 40 MG capsule Take by mouth. 03/18/12  Yes [provider]  SSD 1 % cream Apply 1 application topically daily. For rash 04/10/18  Yes [provider]  Vitamin D, Ergocalciferol, (DRISDOL) 1.25 MG (50000 UNIT) CAPS capsule Take 1 capsule (50,000 Units total) by mouth every 7 (seven) days. 11/12/20  Yes Lindell Spar, MD  nicotine (NICODERM CQ - DOSED IN MG/24 HR) 7 mg/24hr patch Place 1 patch (7 mg total) onto the skin daily. Patient not taking: Reported on 12/09/2020 11/10/20   Lindell Spar, MD    Inpatient Medications: Scheduled Meds:  Continuous Infusions:  PRN Meds:   Allergies:    Allergies  Allergen Reactions  . Morphine And Related   . Other Other (See Comments)    Bells Palsy   . Oxycodone-Acetaminophen Other (See Comments)    Constipates patient   . Latex Itching and Other (See Comments)    Skin "cracks" open   . Morphine Itching and Other (See Comments)    Social History:   Social History   Socioeconomic History  . Marital status: Married    Spouse name: Not on file  . Number of children: Not on file  . Years of education: Not on file  . Highest education level: Not on file  Occupational History  . Not on file  Tobacco Use  . Smoking status: Former Research scientist (life sciences)  . Smokeless tobacco: Never Used  Vaping Use  . Vaping Use: Never used  Substance and Sexual Activity  . Alcohol use: Not on file  . Drug use: Not on file  . Sexual activity: Not on file  Other Topics Concern  . Not on file  Social History Narrative  . Not on file   Social Determinants of Health   Financial Resource Strain: Not on file  Food Insecurity: Not on file  Transportation Needs: Not on file  Physical  Activity: Not on file  Stress: Not on file  Social Connections: Not on file  Intimate Partner Violence: Not on file    Family History:   Father had a heart attack. Family History  Problem Relation Age of Onset  . Thyroid disease Mother   . Hypertension Mother   . Heart attack Mother   . Stroke Mother   . Heart failure Mother   . Diabetes Mother   . Diabetes Father   . Heart attack Father   . Stroke Father   . Kidney disease Father   . Heart failure Father      ROS:  Please see the history of present illness.  All other ROS reviewed and negative.     Physical Exam/Data:   Vitals:   12/09/20 1030 12/09/20 1045  12/09/20 1057 12/09/20 1100  BP: 136/70   (!) 153/88  Pulse: 88 (!) 106  94  Resp: 20 (!) 25  (!) 21  Temp:      TempSrc:      SpO2: 90% (!) 89% 91% 95%   No intake or output data in the 24 hours ending 12/09/20 1127 Last 3 Weights 12/09/2020 11/19/2020 11/10/2020  Weight (lbs) 240 lb 6.4 oz 219 lb 219 lb 1.9 oz  Weight (kg) 109.045 kg 99.338 kg 99.392 kg     There is no height or weight on file to calculate BMI.  General: Obese female, well developed, in no acute distress HEENT: normal Lymph: no adenopathy Neck: Positive JVD Endocrine:  No thryomegaly Vascular: No carotid bruits; FA pulses 2+ bilaterally without bruits  Cardiac:  normal S1, S2; RRR; no murmur.  Difficult to appreciate any heart murmurs. Lungs: Decreased breath sounds at the bases.  Bilateral rhonchi noted. Abd: soft, nontender, no hepatomegaly.  Positive obesity Ext: no edema Musculoskeletal:  No deformities, BUE and BLE strength normal and equal Skin: warm and dry  Neuro:  CNs 2-12 intact, no focal abnormalities noted Psych:  Normal affect   EKG:  The EKG was personally reviewed and demonstrates: Sinus rhythm Telemetry:  Telemetry was personally reviewed and demonstrates: Sinus rhythm  Relevant CV Studies: Echocardiogram on 12/09/2020: Report pending.  Laboratory Data:  High  Sensitivity Troponin:   Recent Labs  Lab 12/09/20 0955  TROPONINIHS 10     Chemistry Recent Labs  Lab 12/09/20 0955  NA 139  K 4.0  CL 102  CO2 28  GLUCOSE 92  BUN 18  CREATININE 0.86  CALCIUM 9.0  GFRNONAA >60  ANIONGAP 9    No results for input(s): PROT, ALBUMIN, AST, ALT, ALKPHOS, BILITOT in the last 168 hours. Hematology Recent Labs  Lab 12/09/20 0955  WBC 11.0*  RBC 6.07*  HGB 14.7  HCT 50.4*  MCV 83.0  MCH 24.2*  MCHC 29.2*  RDW 15.6*  PLT 281   BNP Recent Labs  Lab 12/09/20 0955  BNP 73.0    DDimer No results for input(s): DDIMER in the last 168 hours.   Radiology/Studies:  DG Chest Portable 1 View  Result Date: 12/09/2020 CLINICAL DATA:  Shortness of breath EXAM: PORTABLE CHEST 1 VIEW COMPARISON:  Feb 29, 2012 FINDINGS: There are small pleural effusions bilaterally. There is slight bibasilar atelectasis. No consolidation. Heart is mildly enlarged with a degree of pulmonary venous hypertension. No adenopathy. No bone lesions. IMPRESSION: There is cardiomegaly with pulmonary vascular congestion. Small pleural effusions noted. There may be a degree of underlying congestive heart failure. There is mild bibasilar atelectasis but no edema or consolidation appreciable. Electronically Signed   By: Lowella Grip III M.D.   On: 12/09/2020 10:28     Assessment and Plan:   1. Shortness of breath:  Could be due to combination of CHF and COPD.  I agree with diuretic therapy.  I would recommend an echocardiogram before further recommendations.  BNP and troponin levels appear to be within adequate range.  I have explained this to the patient and recommended admission to the hospital until her O2 sats improve.  2.  COPD: Continue bronchodilator therapy.  Patient states she discontinued smoking in January 2022.   Risk Assessment/Risk Scores:        New York Heart Association (NYHA) Functional Class NYHA Class III  I would recommend continuing current  diuretic therapy.  Patient would benefit from adding ACE/ARB  as tolerated.  I would be cautious in recommending beta-blocker therapy in light of severe COPD problems.    For questions or updates, please contact [place] Please consult [email or web address] for contact info under    Signed, Curtis Sites, MD  12/09/2020 11:27 AM
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
## 2020-12-10 ENCOUNTER — Emergency Department (HOSPITAL_COMMUNITY): Payer: Medicare Other

## 2020-12-10 ENCOUNTER — Encounter (HOSPITAL_COMMUNITY): Payer: Self-pay | Admitting: *Deleted

## 2020-12-10 ENCOUNTER — Emergency Department (HOSPITAL_COMMUNITY)
Admission: EM | Admit: 2020-12-10 | Discharge: 2020-12-10 | Discharge status: Against Medical Advice | Payer: Medicare Other | Attending: Emergency Medicine | Admitting: Emergency Medicine

## 2020-12-10 ENCOUNTER — Other Ambulatory Visit: Payer: Self-pay

## 2020-12-10 ENCOUNTER — Encounter: Payer: Self-pay | Admitting: Cardiology

## 2020-12-10 ENCOUNTER — Ambulatory Visit (INDEPENDENT_AMBULATORY_CARE_PROVIDER_SITE_OTHER): Payer: Medicare Other | Admitting: Cardiology

## 2020-12-10 VITALS — BP 142/70 | HR 101 | Ht 60.0 in | Wt 239.0 lb

## 2020-12-10 DIAGNOSIS — I5042 Chronic combined systolic (congestive) and diastolic (congestive) heart failure: Secondary | ICD-10-CM | POA: Diagnosis not present

## 2020-12-10 DIAGNOSIS — Z7984 Long term (current) use of oral hypoglycemic drugs: Secondary | ICD-10-CM | POA: Diagnosis not present

## 2020-12-10 DIAGNOSIS — I110 Hypertensive heart disease with heart failure: Secondary | ICD-10-CM | POA: Insufficient documentation

## 2020-12-10 DIAGNOSIS — Z79899 Other long term (current) drug therapy: Secondary | ICD-10-CM | POA: Diagnosis not present

## 2020-12-10 DIAGNOSIS — R0902 Hypoxemia: Secondary | ICD-10-CM | POA: Diagnosis not present

## 2020-12-10 DIAGNOSIS — J449 Chronic obstructive pulmonary disease, unspecified: Secondary | ICD-10-CM | POA: Insufficient documentation

## 2020-12-10 DIAGNOSIS — Z794 Long term (current) use of insulin: Secondary | ICD-10-CM | POA: Diagnosis not present

## 2020-12-10 DIAGNOSIS — Z87891 Personal history of nicotine dependence: Secondary | ICD-10-CM | POA: Insufficient documentation

## 2020-12-10 DIAGNOSIS — Z91040 Latex allergy status: Secondary | ICD-10-CM | POA: Diagnosis not present

## 2020-12-10 DIAGNOSIS — J45909 Unspecified asthma, uncomplicated: Secondary | ICD-10-CM | POA: Diagnosis not present

## 2020-12-10 DIAGNOSIS — E119 Type 2 diabetes mellitus without complications: Secondary | ICD-10-CM | POA: Diagnosis not present

## 2020-12-10 DIAGNOSIS — J90 Pleural effusion, not elsewhere classified: Secondary | ICD-10-CM | POA: Diagnosis not present

## 2020-12-10 DIAGNOSIS — I5189 Other ill-defined heart diseases: Secondary | ICD-10-CM

## 2020-12-10 DIAGNOSIS — I10 Essential (primary) hypertension: Secondary | ICD-10-CM

## 2020-12-10 DIAGNOSIS — J9601 Acute respiratory failure with hypoxia: Secondary | ICD-10-CM

## 2020-12-10 DIAGNOSIS — R0602 Shortness of breath: Secondary | ICD-10-CM | POA: Diagnosis not present

## 2020-12-10 DIAGNOSIS — I517 Cardiomegaly: Secondary | ICD-10-CM | POA: Diagnosis not present

## 2020-12-10 DIAGNOSIS — R7989 Other specified abnormal findings of blood chemistry: Secondary | ICD-10-CM | POA: Diagnosis not present

## 2020-12-10 LAB — CBC WITH DIFFERENTIAL/PLATELET
Abs Immature Granulocytes: 0.04 10*3/uL (ref 0.00–0.07)
Basophils Absolute: 0.1 10*3/uL (ref 0.0–0.1)
Basophils Relative: 1 %
Eosinophils Absolute: 0.2 10*3/uL (ref 0.0–0.5)
Eosinophils Relative: 2 %
HCT: 45.1 % (ref 36.0–46.0)
Hemoglobin: 13.3 g/dL (ref 12.0–15.0)
Immature Granulocytes: 0 %
Lymphocytes Relative: 21 %
Lymphs Abs: 2.4 10*3/uL (ref 0.7–4.0)
MCH: 24.2 pg — ABNORMAL LOW (ref 26.0–34.0)
MCHC: 29.5 g/dL — ABNORMAL LOW (ref 30.0–36.0)
MCV: 82.1 fL (ref 80.0–100.0)
Monocytes Absolute: 0.9 10*3/uL (ref 0.1–1.0)
Monocytes Relative: 8 %
Neutro Abs: 7.7 10*3/uL (ref 1.7–7.7)
Neutrophils Relative %: 68 %
Platelets: 248 10*3/uL (ref 150–400)
RBC: 5.49 MIL/uL — ABNORMAL HIGH (ref 3.87–5.11)
RDW: 15 % (ref 11.5–15.5)
WBC: 11.3 10*3/uL — ABNORMAL HIGH (ref 4.0–10.5)
nRBC: 0 % (ref 0.0–0.2)

## 2020-12-10 LAB — BASIC METABOLIC PANEL
Anion gap: 8 (ref 5–15)
BUN: 13 mg/dL (ref 6–20)
CO2: 29 mmol/L (ref 22–32)
Calcium: 8.5 mg/dL — ABNORMAL LOW (ref 8.9–10.3)
Chloride: 98 mmol/L (ref 98–111)
Creatinine, Ser: 0.97 mg/dL (ref 0.44–1.00)
GFR, Estimated: >60 mL/min (ref >60)
Glucose, Bld: 289 mg/dL — ABNORMAL HIGH (ref 70–99)
Potassium: 3.6 mmol/L (ref 3.5–5.1)
Sodium: 135 mmol/L (ref 135–145)

## 2020-12-10 MED ORDER — IOHEXOL 350 MG/ML SOLN
100.0000 mL | Freq: Once | INTRAVENOUS | Status: AC | PRN
  Administered 2020-12-10: 100 mL via INTRAVENOUS

## 2020-12-10 NOTE — Patient Instructions (Signed)
Your physician recommends that you schedule a follow-up appointment in: PENDING   PLEASE [place]
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
## 2020-12-10 NOTE — ED Notes (Signed)
Patient transported to CT via wheelchair on oxygen @ 2lnc accompanied by radiology staff.

## 2020-12-10 NOTE — ED Notes (Signed)
Oxygen sat is 79% on room air

## 2020-12-10 NOTE — ED Triage Notes (Signed)
Referred here for CT OF CHEST,  Seen here yesterday

## 2020-12-10 NOTE — ED Notes (Signed)
Pt left AMA after speaking with provider.

## 2020-12-10 NOTE — Progress Notes (Signed)
Cardiology Office Note  Date: 12/10/2020   ID: Christine Weaver, DOB 07/06/1966, MRN [id number]  PCP:  Lindell Spar, MD  Cardiologist:  Rozann Lesches, MD Electrophysiologist:  None   Chief Complaint  Patient presents with  . ER follow-up    History of Present Illness: Christine Weaver is a 55 y.o. female presenting for a post ER visit.  This is our first meeting, I reviewed extensive records.  She was seen yesterday by her PCP Dr. Posey Pronto having presented with shortness of breath and found to have acute hypoxic respiratory failure.  She was sent to the ER subsequently and evaluated by Dr. Roderic Palau.  Patient also reporting lower extremity edema with intermittent episodes of chest discomfort.  High-sensitivity troponin I levels were normal arguing against ACS.  BNP was also normal. No D-dimer was obtained. Recent SARS coronavirus 2 test negative in late January. Chest x-ray reported cardiomegaly with pulmonary vascular congestion and small pleural effusions.  Patient was seen in consultation by covering cardiologist Dr. Lurlean Leyden who recommended an echocardiogram.  This study revealed normal LVEF at 55 to 60% with mild LVH and indeterminate diastolic parameters although evidence of elevated left atrial pressure.  RV contraction also normal.  No major valvular abnormalities.  Hospital admission was recommended for further evaluation and stabilization of hypoxic respiratory failure, also initiation of diuretics.  Patient ultimately signed out AMA and left the ER.  At baseline patient has a history of COPD/asthma. She tells me that her leg swelling has actually been present fluctuating since November 2021. Shortness of breath with cough and wheezing has been present since she had her second Moderna vaccine in mid January. States that her PCP treated her with antibiotics and steroids, she felt somewhat better, but symptoms have generally persisted. She did not stay for further evaluation yesterday because  she did not have childcare for her daughters at home.  Oxygen saturation dips into the low 80s today with ambulation, settles in around 90% at rest. She did pick up the Lasix and is taken 2 doses.  I had a frank discussion with her today and recommended that she go back to the [facility] ER to complete work-up, she likely needs a chest CTA to exclude pulmonary embolus, and this may also provide more information about her degree of emphysema or any other associated abnormalities. It seems unlikely that fluid overload in and of itself is an explanation for her acute hypoxic respiratory failure.  Past Medical History:  Diagnosis Date  . Asthma   . COPD (chronic obstructive pulmonary disease) ([place])   . Hyperlipidemia   . Hypertension   . Type 2 diabetes mellitus ([place])     Past Surgical History:  Procedure Laterality Date  . CESAREAN SECTION      Current Outpatient Medications  Medication Sig Dispense Refill  . albuterol (PROVENTIL) (2.5 MG/3ML) 0.083% nebulizer solution Inhale contents fo 1 vial in nebulizer every 4 hours if needed    . albuterol (VENTOLIN HFA) 108 (90 Base) MCG/ACT inhaler Inhale 2 puffs into the lungs every 4 (four) hours as needed for wheezing or shortness of breath. 18 g 5  . amLODipine (NORVASC) 10 MG tablet Take 1 tablet (10 mg total) by mouth daily. 90 tablet 1  . ANORO ELLIPTA 62.5-25 MCG/INH AEPB Inhale 1 puff into the lungs daily. 14 each 5  . atorvastatin (LIPITOR) 80 MG tablet Take 1 tablet (80 mg total) by mouth daily. 90 tablet 1  . Cholecalciferol (VITAMIN D3) 50  MCG (2000 UT) TABS Take 1 capsule by mouth daily.    . fluconazole (DIFLUCAN) 150 MG tablet Take 150 mg by mouth as needed.    . fluticasone (FLONASE) 50 MCG/ACT nasal spray Place into both nostrils daily.    . fluticasone-salmeterol (ADVAIR HFA) 115-21 MCG/ACT inhaler Inhale 2 puffs into the lungs 2 (two) times daily as needed (wheezing).     . furosemide (LASIX) 20 MG tablet Take 1 tablet (20 mg  total) by mouth daily. 30 tablet 0  . glucose blood (ACCU-CHEK AVIVA PLUS) test strip TEST BLOOD SUGAR ONE TO TWO TIMES A DAY as directed    . HYDROcodone-acetaminophen (NORCO/VICODIN) 5-325 MG tablet Take one tab po q 4 hrs prn pain 10 tablet 0  . ibuprofen (ADVIL,MOTRIN) 800 MG tablet Take 800 mg by mouth 3 (three) times daily.     . insulin aspart (NOVOLOG) 100 UNIT/ML injection Inject 14-20 Units into the skin 3 (three) times daily before meals. 10 mL 2  . insulin glargine (LANTUS) 100 UNIT/ML injection Inject 80 Units into the skin at bedtime.    . liraglutide (VICTOZA) 18 MG/3ML SOPN Inject 1.8 mg into the skin daily.     Marland Kitchen lisinopril (ZESTRIL) 30 MG tablet Take 1 tablet (30 mg total) by mouth daily. 30 tablet 0  . metFORMIN (GLUCOPHAGE) 1000 MG tablet Take 1,000 mg by mouth 2 (two) times daily with a meal.     . nystatin cream (MYCOSTATIN) Apply 1 application topically 2 (two) times daily.    Marland Kitchen omeprazole (PRILOSEC) 40 MG capsule Take by mouth.    . SSD 1 % cream Apply 1 application topically daily. For rash  0  . Vitamin D, Ergocalciferol, (DRISDOL) 1.25 MG (50000 UNIT) CAPS capsule Take 1 capsule (50,000 Units total) by mouth every 7 (seven) days. 12 capsule 1   No current facility-administered medications for this visit.   Allergies:  Morphine and related, Other, Oxycodone-acetaminophen, Latex, and Morphine   Social History: The patient  reports that she has quit smoking. Her smoking use included cigarettes. She has never used smokeless tobacco.   Family History: The patient's family history includes Diabetes in her father and mother; Heart attack in her father and mother; Heart failure in her father and mother; Hypertension in her mother; Kidney disease in her father; Stroke in her father and mother; Thyroid disease in her mother.   ROS: No syncope. No hemoptysis. No fevers or chills.  Physical Exam: VS:  BP (!) 142/70   Pulse (!) 101   Ht 5' (1.524 m)   Wt 239 lb (108.4 kg)    SpO2 90%   BMI 46.68 kg/m , BMI Body mass index is 46.68 kg/m.  Wt Readings from Last 3 Encounters:  12/10/20 239 lb (108.4 kg)  12/09/20 240 lb 6.4 oz (109 kg)  11/19/20 219 lb (99.3 kg)    General: Obese woman, no acute distress. HEENT: Conjunctiva and lids normal, wearing a mask. Neck: Supple, difficult to assess JVP. Lungs: No active wheezing, decreased breath sounds at the bases. Cardiac: Regular rate and rhythm, no S3 or significant systolic murmur, no pericardial rub. Abdomen: Protuberant, bowel sounds present, no guarding or rebound. Extremities: Mild, chronic appearing lower leg edema and venous stasis. Skin: Warm and dry. Musculoskeletal: No kyphosis. Neuropsychiatric: Alert and oriented x3, affect grossly appropriate.  ECG:  An ECG dated 12/09/2020 was personally reviewed today and demonstrated:  Sinus rhythm with decreased R wave progression, possible left posterior fascicular block.  Recent  Labwork: 11/11/2020: ALT 14; AST 10; TSH 2.640 12/09/2020: B Natriuretic Peptide 73.0; BUN 18; Creatinine, Ser 0.86; Hemoglobin 14.7; Magnesium 1.8; Platelets 281; Potassium 4.0; Sodium 139     Component Value Date/Time   CHOL 175 11/11/2020 0817   TRIG 159 (H) 11/11/2020 0817   HDL 45 11/11/2020 0817   CHOLHDL 3.9 11/11/2020 0817   LDLCALC 102 (H) 11/11/2020 0817    Other Studies Reviewed Today:  Chest x-ray 12/09/2020: FINDINGS: There are small pleural effusions bilaterally. There is slight bibasilar atelectasis. No consolidation. Heart is mildly enlarged with a degree of pulmonary venous hypertension. No adenopathy. No bone lesions.  IMPRESSION: There is cardiomegaly with pulmonary vascular congestion. Small pleural effusions noted. There may be a degree of underlying congestive heart failure. There is mild bibasilar atelectasis but no edema or consolidation appreciable.  Echocardiogram 12/09/2020: 1. Left ventricular ejection fraction, by estimation, is 55 to 60%.  The  left ventricle has normal function. The left ventricle has no regional  wall motion abnormalities. There is mild left ventricular hypertrophy.  Left ventricular diastolic parameters  are indeterminate. Elevated left atrial pressure.  2. Right ventricular systolic function is normal. The right ventricular  size is normal.  3. The mitral valve was not well visualized. No evidence of mitral valve  regurgitation. No evidence of mitral stenosis.  4. The aortic valve has an indeterminant number of cusps. Aortic valve  regurgitation is not visualized. No aortic stenosis is present.  5. The inferior vena cava is normal in size with greater than 50%  respiratory variability, suggesting right atrial pressure of 3 mmHg.   Assessment and Plan:  1. Shortness of breath with hypoxic respiratory failure, oxygen desaturation noted with activity. She was seen by her PCP yesterday with same findings, sent to the [facility] but ultimately left AMA. Echocardiogram done yesterday revealed LVEF 55 to 60% with normal RV contraction. Diastolic parameters were indeterminate but left atrial pressure likely elevated and she did have evidence of fluid overload by chest x-ray although BNP was normal. No clear evidence of ACS by high-sensitivity troponin I levels. D-dimer was not obtained. Seems unlikely that fluid overload is sole explanation for her hypoxia. I had a frank discussion with her today and recommended that she go back to the ER for further investigation, likely needs a chest CTA to exclude pulmonary embolus. She did not stay yesterday due to not having childcare for her daughters at home. She states that she will take care of this and go back in for evaluation today. I have explained to her that without further investigation, she could have a life-threatening condition that goes undiagnosed, and that time is of the essence.  2. Reported history of asthma/COPD. Could certainly also be contributing to  symptoms and may need further medication adjustments. She does not see a pulmonologist.  3. Essential hypertension, systolic in the 009F. She is on Norvasc and lisinopril.   Medication Adjustments/Labs and Tests Ordered: Current medicines are reviewed at length with the patient today.  Concerns regarding medicines are outlined above.   Tests Ordered: No orders of the defined types were placed in this encounter.   Medication Changes: No orders of the defined types were placed in this encounter.   Disposition: Patient is being sent back to the [facility] for further work-up and evaluation. She states that she is arranging childcare for her daughters. I have asked my nurse to communicate with the ER to let them know of her pending  evaluation (she left AMA yesterday). Also asking to get follow-up arranged with her PCP next week.  Signed, Satira Sark, MD, [facility] 12/10/2020 3:19 PM    [facility] [place], [place], [place] [address] Phone: [phone]; Fax: [phone]

## 2020-12-13 ENCOUNTER — Ambulatory Visit (INDEPENDENT_AMBULATORY_CARE_PROVIDER_SITE_OTHER): Payer: Medicare Other | Admitting: "Endocrinology

## 2020-12-13 ENCOUNTER — Encounter: Payer: Self-pay | Admitting: "Endocrinology

## 2020-12-13 ENCOUNTER — Telehealth: Payer: Self-pay | Admitting: Cardiology

## 2020-12-13 ENCOUNTER — Other Ambulatory Visit: Payer: Self-pay

## 2020-12-13 VITALS — BP 138/74 | HR 100 | Ht 60.0 in | Wt 232.8 lb

## 2020-12-13 DIAGNOSIS — E1165 Type 2 diabetes mellitus with hyperglycemia: Secondary | ICD-10-CM | POA: Diagnosis not present

## 2020-12-13 DIAGNOSIS — Z794 Long term (current) use of insulin: Secondary | ICD-10-CM | POA: Diagnosis not present

## 2020-12-13 DIAGNOSIS — E782 Mixed hyperlipidemia: Secondary | ICD-10-CM

## 2020-12-13 DIAGNOSIS — I10 Essential (primary) hypertension: Secondary | ICD-10-CM

## 2020-12-13 MED ORDER — FUROSEMIDE 20 MG PO TABS: 20.0000 mg | ORAL_TABLET | Freq: Two times a day (BID) | ORAL | 0 refills | Status: DC

## 2020-12-13 NOTE — Telephone Encounter (Signed)
Please give pt a call w/ next steps since she was seen in the ER

## 2020-12-13 NOTE — Telephone Encounter (Signed)
Pt will schedule appt with pcp - scheduled with [facility] [place] 3/16 - pt voiced understanding

## 2020-12-13 NOTE — Telephone Encounter (Signed)
Pt wanted to make Dr Domenic Polite aware that CT was negative for PE (ED wanted to admit pt but she left AMA says she couldn't find anyone to watch children overnight) pt wanting to know if she needed any further testing or f/u

## 2020-12-13 NOTE — Progress Notes (Signed)
12/13/2020, 1:55 PM  Endocrinology follow-up note   Subjective:    Patient ID: Christine Weaver, female    DOB: 13-Sep-1966.  Christine Weaver is being seen in follow-up after she was seen in consultation for management of currently uncontrolled symptomatic diabetes requested by  Lindell Spar, MD.   Past Medical History:  Diagnosis Date  . Asthma   . COPD (chronic obstructive pulmonary disease) ([place])   . Hyperlipidemia   . Hypertension   . Type 2 diabetes mellitus ([place])     Past Surgical History:  Procedure Laterality Date  . CESAREAN SECTION      Social History   Socioeconomic History  . Marital status: Married    Spouse name: Not on file  . Number of children: Not on file  . Years of education: Not on file  . Highest education level: Not on file  Occupational History  . Not on file  Tobacco Use  . Smoking status: Former Smoker    Types: Cigarettes  . Smokeless tobacco: Never Used  Vaping Use  . Vaping Use: Never used  Substance and Sexual Activity  . Alcohol use: Not on file  . Drug use: Not on file  . Sexual activity: Not on file  Other Topics Concern  . Not on file  Social History Narrative  . Not on file   Social Determinants of Health   Financial Resource Strain: Not on file  Food Insecurity: Not on file  Transportation Needs: Not on file  Physical Activity: Not on file  Stress: Not on file  Social Connections: Not on file    Family History  Problem Relation Age of Onset  . Thyroid disease Mother   . Hypertension Mother   . Heart attack Mother   . Stroke Mother   . Heart failure Mother   . Diabetes Mother   . Diabetes Father   . Heart attack Father   . Stroke Father   . Kidney disease Father   . Heart failure Father     Outpatient Encounter Medications as of 12/13/2020  Medication Sig  . Vitamin D, Ergocalciferol, (DRISDOL) 1.25 MG (50000 UNIT) CAPS capsule Take 1 capsule (50,000 Units total) by mouth every 7  (seven) days.  Marland Kitchen albuterol (PROVENTIL) (2.5 MG/3ML) 0.083% nebulizer solution Take 2.5 mg by nebulization every 4 (four) hours as needed for wheezing or shortness of breath.  Marland Kitchen albuterol (VENTOLIN HFA) 108 (90 Base) MCG/ACT inhaler Inhale 2 puffs into the lungs every 4 (four) hours as needed for wheezing or shortness of breath.  Marland Kitchen amLODipine (NORVASC) 10 MG tablet Take 1 tablet (10 mg total) by mouth daily.  Jearl Klinefelter ELLIPTA 62.5-25 MCG/INH AEPB Inhale 1 puff into the lungs daily.  Marland Kitchen atorvastatin (LIPITOR) 80 MG tablet Take 1 tablet (80 mg total) by mouth daily.  . Cholecalciferol (VITAMIN D3) 50 MCG (2000 UT) TABS Take 1 capsule by mouth daily.  . fluconazole (DIFLUCAN) 150 MG tablet Take 150 mg by mouth as needed.  . fluticasone (FLONASE) 50 MCG/ACT nasal spray Place into both nostrils daily.  . fluticasone-salmeterol (ADVAIR HFA) 115-21 MCG/ACT inhaler Inhale 2 puffs into the lungs 2 (two) times daily as needed (wheezing).   Marland Kitchen glucose blood (ACCU-CHEK AVIVA PLUS) test strip TEST BLOOD SUGAR ONE TO TWO TIMES A DAY as directed  . HYDROcodone-acetaminophen (NORCO/VICODIN) 5-325 MG tablet Take one tab po q 4 hrs prn pain  . ibuprofen (ADVIL,MOTRIN) 800 MG tablet Take 800  mg by mouth 2 (two) times daily.  . insulin aspart (NOVOLOG) 100 UNIT/ML injection Inject 14-20 Units into the skin 3 (three) times daily before meals.  . insulin glargine (LANTUS) 100 UNIT/ML injection Inject 80 Units into the skin at bedtime.  . liraglutide (VICTOZA) 18 MG/3ML SOPN Inject 1.8 mg into the skin daily.   Marland Kitchen lisinopril (ZESTRIL) 30 MG tablet Take 1 tablet (30 mg total) by mouth daily.  . metFORMIN (GLUCOPHAGE) 1000 MG tablet Take 1,000 mg by mouth 2 (two) times daily with a meal.   . nystatin cream (MYCOSTATIN) Apply 1 application topically 2 (two) times daily.  Marland Kitchen omeprazole (PRILOSEC) 40 MG capsule Take 40 mg by mouth daily.  Marland Kitchen SSD 1 % cream Apply 1 application topically daily. For rash  . [DISCONTINUED] furosemide  (LASIX) 20 MG tablet Take 1 tablet (20 mg total) by mouth daily. (Patient taking differently: Take 20 mg by mouth 2 (two) times daily.)   No facility-administered encounter medications on file as of 12/13/2020.    ALLERGIES: Allergies  Allergen Reactions  . Morphine And Related   . Other Other (See Comments)    Bells Palsy   . Oxycodone-Acetaminophen Other (See Comments)    Constipates patient   . Latex Itching and Other (See Comments)    Skin "cracks" open   . Morphine Itching and Other (See Comments)    VACCINATION STATUS: Immunization History  Administered Date(s) Administered  . Moderna Sars-Covid-2 Vaccination 11/13/2020  . Tdap 03/13/2018    Diabetes She presents for her follow-up diabetic visit. She has type 2 diabetes mellitus. Onset time: She was diagnosed at approximately age of 55 years. Her disease course has been improving. There are no hypoglycemic associated symptoms. Pertinent negatives for hypoglycemia include no confusion, headaches, pallor or seizures. Associated symptoms include fatigue, polydipsia and polyuria. Pertinent negatives for diabetes include no chest pain and no polyphagia. There are no hypoglycemic complications. Symptoms are worsening. Risk factors for coronary artery disease include dyslipidemia, diabetes mellitus, hypertension, obesity, sedentary lifestyle and tobacco exposure. Current diabetic treatment includes insulin injections. Her weight is fluctuating minimally. She is following a generally unhealthy diet. When asked about meal planning, she reported none. She has not had a previous visit with a dietitian. She never participates in exercise. Her home blood glucose trend is increasing steadily. (She missed her appointment since March 2021.  She returns with out any logs nor meter.  Her recent previsit labs show A1c of 9.5%, slightly improved from 10.3% during her last visit in February 2021.  She claims to have stayed on same regimen and dose of  medications.  She denies recent hypoglycemia.   ) An ACE inhibitor/angiotensin II receptor blocker is being taken. Eye exam is current.  Hyperlipidemia This is a chronic problem. The problem is uncontrolled. Recent lipid tests were reviewed and are high. Exacerbating diseases include diabetes and obesity. Pertinent negatives include no chest pain, myalgias or shortness of breath. Current antihyperlipidemic treatment includes statins. Risk factors for coronary artery disease include dyslipidemia, diabetes mellitus, family history, hypertension, obesity and a sedentary lifestyle.  Hypertension This is a chronic problem. The current episode started more than 1 year ago. The problem is uncontrolled. Pertinent negatives include no chest pain, headaches, palpitations or shortness of breath. Risk factors for coronary artery disease include diabetes mellitus, dyslipidemia, obesity, sedentary lifestyle, smoking/tobacco exposure and family history. Past treatments include ACE inhibitors.   Review of systems: Limited as above.    Objective:    Vitals  with BMI 12/13/2020 12/10/2020 12/10/2020  Height 5\' 0"  - -  Weight 232 lbs 13 oz - -  BMI 25.95 - -  Systolic 638 756 -  Diastolic 74 69 -  Pulse 433 94 96    BP 138/74   Pulse 100   Ht 5' (1.524 m)   Wt 232 lb 12.8 oz (105.6 kg)   BMI 45.47 kg/m   Wt Readings from Last 3 Encounters:  12/13/20 232 lb 12.8 oz (105.6 kg)  12/10/20 239 lb (108.4 kg)  12/09/20 240 lb 6.4 oz (109 kg)        CMP ( most recent) CMP     Component Value Date/Time   NA 135 12/10/2020 1820   NA 140 11/11/2020 0817   K 3.6 12/10/2020 1820   CL 98 12/10/2020 1820   CO2 29 12/10/2020 1820   GLUCOSE 289 (H) 12/10/2020 1820   BUN 13 12/10/2020 1820   BUN 13 11/11/2020 0817   CREATININE 0.97 12/10/2020 1820   CALCIUM 8.5 (L) 12/10/2020 1820   PROT 5.9 (L) 11/11/2020 0817   ALBUMIN 3.5 (L) 11/11/2020 0817   AST 10 11/11/2020 0817   ALT 14 11/11/2020 0817    ALKPHOS 116 11/11/2020 0817   BILITOT 0.3 11/11/2020 0817   GFRNONAA >60 12/10/2020 1820   GFRAA 80 11/11/2020 0817     Diabetic Labs (most recent): Lab Results  Component Value Date   HGBA1C 9.5 (H) 11/11/2020   HGBA1C 9.3 04/22/2020   HGBA1C 10.3 (A) 12/15/2019     Lipid Panel ( most recent) Lipid Panel     Component Value Date/Time   CHOL 175 11/11/2020 0817   TRIG 159 (H) 11/11/2020 0817   HDL 45 11/11/2020 0817   CHOLHDL 3.9 11/11/2020 0817   LDLCALC 102 (H) 11/11/2020 0817   LABVLDL 28 11/11/2020 0817      Lab Results  Component Value Date   TSH 2.640 11/11/2020   TSH 1.42 09/23/2019   FREET4 1.28 11/11/2020      Assessment & Plan:   1. Type 2 diabetes mellitus with hyperglycemia, with long-term current use of insulin ([facility])  - [place] has currently uncontrolled symptomatic type 2 DM since  55 years of age. She missed her appointment since March 2021.  She returns with out any logs nor meter.  Her recent previsit labs show A1c of 9.5%, slightly improved from 10.3% during her last visit in February 2021.  She claims to have stayed on same regimen and dose of medications.  She denies recent hypoglycemia.    Recent labs reviewed.  - I had a long discussion with her about the progressive nature of diabetes and the pathology behind its complications. -her diabetes is complicated by obesity/sedentary life, smoking and she remains at a high risk for more acute and chronic complications which include CAD, CVA, CKD, retinopathy, and neuropathy. These are all discussed in detail with her.  - I have counseled her on diet  and weight management  by adopting a carbohydrate restricted/protein rich diet. Patient is encouraged to switch to  unprocessed or minimally processed     complex starch and increased protein intake (animal or plant source), fruits, and vegetables. -  she is advised to stick to a routine mealtimes to eat 3 meals  a day and avoid unnecessary snacks (  to snack only to correct hypoglycemia).   - she acknowledges that there is a room for improvement in her food and drink choices. - Suggestion  is made for her to avoid simple carbohydrates  from her diet including Cakes, Sweet Desserts, Ice Cream, Soda (diet and regular), Sweet Tea, Candies, Chips, Cookies, Store Bought Juices, Alcohol in Excess of  1-2 drinks a day, Artificial Sweeteners,  Coffee Creamer, and "Sugar-free" Products, Lemonade. This will help patient to have more stable blood glucose profile and potentially avoid unintended weight gain.  - she will be scheduled with Jearld Fenton, RDN, CDE for diabetes education.  - I have approached her with the following individualized plan to manage  her diabetes and patient agrees:   -Given her current and prevailing glycemic burden, she will continue to need intensive treatment with basal/bolus insulin in order for her to achieve and maintain control of diabetes to target.   -Since she did not bring any logs nor meter to review, she is advised to resume her previous regimen including Lantus 80 units nightly, NovoLog units 3 times a day with meals  for pre-meal BG readings of 90-150mg /dl, plus patient specific correction dose for unexpected hyperglycemia above 150mg /dl, associated with strict monitoring of glucose 4 times a day-before meals and at bedtime. This patient will benefit from a CGM.  She will be considered for the freestyle libre device next visit. - she is warned not to take insulin without proper monitoring per orders. - Adjustment parameters are given to her for hypo and hyperglycemia in writing. - she is encouraged to call clinic for blood glucose levels less than 70 or above 300 mg /dl. - she is advised to continue Metformin 1000 mg p.o. twice daily, Victoza 1.8 mg subcutaneously daily,  therapeutically suitable for patient .  - Specific targets for  A1c;  LDL, HDL,  and Triglycerides were discussed with the patient.  2) Blood  Pressure /Hypertension:   -Her blood pressure is controlled to target.   she is advised to continue her current medications including lisinopril 20 mg p.o. daily with breakfast . 3) Lipids/Hyperlipidemia:   Review of her recent lipid panel showed un controlled  LDL at 102.  She is advised to continue atorvastatin 80 mg p.o. daily at bedtime.    Side effects and precautions discussed with her.  4)  Weight/Diet: Her BMI is 75.1 -  clearly complicating her diabetes care.   she is  a candidate for weight loss. I discussed with her the fact that loss of 5 - 10% of her  current body weight will have the most impact on her diabetes management.  Exercise, and detailed carbohydrates information provided  -  detailed on discharge instructions.  5) Chronic Care/Health Maintenance:  -she  is on ACEI/ARB and Statin medications and  is encouraged to initiate and continue to follow up with Ophthalmology, Dentist,  Podiatrist at least yearly or according to recommendations, and advised to  quit smoking. I have recommended yearly flu vaccine and pneumonia vaccine at least every 5 years; moderate intensity exercise for up to 150 minutes weekly; and  sleep for at least 7 hours a day.  - she is  advised to maintain close follow up with Lindell Spar, MD for primary care needs, as well as her other providers for optimal and coordinated care.  - Time spent on this patient care encounter:  40 min, of which > 50% was spent in  counseling and the rest reviewing her blood glucose logs , discussing her hypoglycemia and hyperglycemia episodes, reviewing her current and  previous labs / studies  ( including abstraction from other facilities) and  medications  doses and developing a  long term treatment plan and documenting her care.   Please refer to Patient Instructions for Blood Glucose Monitoring and Insulin/Medications Dosing Guide"  in media tab for additional information. Please  also refer to " Patient Self Inventory" in  the Media  tab for reviewed elements of pertinent patient history.  Keiyana Michalski participated in the discussions, expressed understanding, and voiced agreement with the above plans.  All questions were answered to her satisfaction. she is encouraged to contact clinic should she have any questions or concerns prior to her return visit.    Follow up plan: - Return in about 10 days (around 12/23/2020) for Bring Meter and Logs- A1c in Office.  Glade Lloyd, MD [facility] [address] [place], [place] [address] Phone: [phone]  Fax: [phone]    12/13/2020, 1:55 PM  This note was partially dictated with voice recognition software. Similar sounding words can be transcribed inadequately or may not  be corrected upon review.

## 2020-12-13 NOTE — Telephone Encounter (Signed)
I reviewed the chest CT report.  I am glad that she did go back and have this testing done and reassured that she did not have a pulmonary embolus.  She does have pleural effusions and should stay on the Lasix for now.  I would ask that she see her PCP this week for reassessment and in case she needs to be referred to a pulmonologist.  Otherwise we will continue to follow her fluid status, please set her up for a visit in [place] with APP in the next few weeks.

## 2020-12-13 NOTE — Patient Instructions (Signed)
                                     Advice for Weight Management  -For most of us the best way to lose weight is by diet management. Generally speaking, diet management means consuming less calories intentionally which over time brings about progressive weight loss.  This can be achieved more effectively by restricting carbohydrate consumption to the minimum possible.  So, it is critically important to know your numbers: how much calorie you are consuming and how much calorie you need. More importantly, our carbohydrates sources should be unprocessed or minimally processed complex starch food items.   Sometimes, it is important to balance nutrition by increasing protein intake (animal or plant source), fruits, and vegetables.  -Sticking to a routine mealtime to eat 3 meals a day and avoiding unnecessary snacks is shown to have a big role in weight control. Under normal circumstances, the only time we lose real weight is when we are hungry, so allow hunger to take place- hunger means no food between meal times, only water.  It is not advisable to starve.   -It is better to avoid simple carbohydrates including: Cakes, Sweet Desserts, Ice Cream, Soda (diet and regular), Sweet Tea, Candies, Chips, Cookies, Store Bought Juices, Alcohol in Excess of  1-2 drinks a day, Lemonade,  Artificial Sweeteners, Doughnuts, Coffee Creamers, "Sugar-free" Products, etc, etc.  This is not a complete list.....    -Consulting with certified diabetes educators is proven to provide you with the most accurate and current information on diet.  Also, you may be  interested in discussing diet options/exchanges , we can schedule a visit with Christine Weaver, RDN, CDE for individualized nutrition education.  -Exercise: If you are able: 30 -60 minutes a day ,4 days a week, or 150 minutes a week.  The longer the better.  Combine stretch, strength, and aerobic activities.  If you were told in the  past that you have high risk for cardiovascular diseases, you may seek evaluation by your heart doctor prior to initiating moderate to intense exercise programs.                                  Additional Care Considerations for Diabetes   -Diabetes  is a chronic disease.  The most important care consideration is regular follow-up with your diabetes care provider with the goal being avoiding or delaying its complications and to take advantage of advances in medications and technology.    -Type 2 diabetes is known to coexist with other important comorbidities such as high blood pressure and high cholesterol.  It is critical to control not only the diabetes but also the high blood pressure and high cholesterol to minimize and delay the risk of complications including coronary artery disease, stroke, amputations, blindness, etc.    - Studies showed that people with diabetes will benefit from a class of medications known as ACE inhibitors and statins.  Unless there are specific reasons not to be on these medications, the standard of care is to consider getting one from these groups of medications at an optimal doses.  These medications are generally considered safe and proven to help protect the heart and the kidneys.    - People with diabetes are encouraged to initiate and maintain regular follow-up with eye doctors, foot   doctors, dentists , and if necessary heart and kidney doctors.     - It is highly recommended that people with diabetes quit smoking or stay away from smoking, and get yearly  flu vaccine and pneumonia vaccine at least every 5 years.  One other important lifestyle recommendation is to ensure adequate sleep - at least 6-7 hours of uninterrupted sleep at night.  -Exercise: If you are able: 30 -60 minutes a day, 4 days a week, or 150 minutes a week.  The longer the better.  Combine stretch, strength, and aerobic activities.  If you were told in the past that you have high risk for  cardiovascular diseases, you may seek evaluation by your heart doctor prior to initiating moderate to intense exercise programs.          

## 2020-12-16 ENCOUNTER — Ambulatory Visit (INDEPENDENT_AMBULATORY_CARE_PROVIDER_SITE_OTHER): Payer: Medicare Other | Admitting: Internal Medicine

## 2020-12-16 ENCOUNTER — Encounter: Payer: Self-pay | Admitting: Internal Medicine

## 2020-12-16 ENCOUNTER — Other Ambulatory Visit: Payer: Self-pay

## 2020-12-16 VITALS — BP 148/70 | HR 107 | Temp 98.4°F | Resp 18 | Ht 60.0 in | Wt 234.4 lb

## 2020-12-16 DIAGNOSIS — J449 Chronic obstructive pulmonary disease, unspecified: Secondary | ICD-10-CM

## 2020-12-16 DIAGNOSIS — I5033 Acute on chronic diastolic (congestive) heart failure: Secondary | ICD-10-CM

## 2020-12-16 DIAGNOSIS — Z09 Encounter for follow-up examination after completed treatment for conditions other than malignant neoplasm: Secondary | ICD-10-CM | POA: Diagnosis not present

## 2020-12-16 NOTE — Progress Notes (Signed)
Established Patient Office Visit  Subjective:  Patient ID: Christine Weaver, female    DOB: [birth year]  Age: 55 y.o. MRN: [id number]  CC:  Chief Complaint  Patient presents with  . Follow-up    ER follow up ER sent her to cardiology they did send her for CT scan and pulmonology. She is about the same since she went to ER no better     HPI Christine Weaver is a 55 y.o. female with PMH of COPD, HFpEF and type 2 DM who presents for follow up after ER visit for COPD and CHF exacerbation.  She was advised to be admitted to hospital, but signed out [facility]. She went to Cardiology office for evaluation for CHF and was referred to ER for hypoxia and left AMA at that time as well. She has been taking Lasix now. Her breathing is mildly improved. She denies to be admitted to hospital as she has teenager kids at home, who she needs to watch. She understands the risk of being untreated for this hypoxia for prolonged time. She does not have home oxygen currently. Her O2 saturation stays in 90s, but drops to upper 80s upon exertion. Her LE swelling has improved with Lasix and leg elevation.  Past Medical History:  Diagnosis Date  . Asthma   . COPD (chronic obstructive pulmonary disease) ([place])   . Hyperlipidemia   . Hypertension   . Type 2 diabetes mellitus ([place])     Past Surgical History:  Procedure Laterality Date  . CESAREAN SECTION      Family History  Problem Relation Age of Onset  . Thyroid disease Mother   . Hypertension Mother   . Heart attack Mother   . Stroke Mother   . Heart failure Mother   . Diabetes Mother   . Diabetes Father   . Heart attack Father   . Stroke Father   . Kidney disease Father   . Heart failure Father     Social History   Socioeconomic History  . Marital status: Married    Spouse name: Not on file  . Number of children: Not on file  . Years of education: Not on file  . Highest education level: Not on file  Occupational History  . Not on file  Tobacco Use   . Smoking status: Former Smoker    Types: Cigarettes  . Smokeless tobacco: Never Used  Vaping Use  . Vaping Use: Never used  Substance and Sexual Activity  . Alcohol use: Not on file  . Drug use: Not on file  . Sexual activity: Not on file  Other Topics Concern  . Not on file  Social History Narrative  . Not on file   Social Determinants of Health   Financial Resource Strain: Not on file  Food Insecurity: Not on file  Transportation Needs: Not on file  Physical Activity: Not on file  Stress: Not on file  Social Connections: Not on file  Intimate Partner Violence: Not on file    Outpatient Medications Prior to Visit  Medication Sig Dispense Refill  . albuterol (PROVENTIL) (2.5 MG/3ML) 0.083% nebulizer solution Take 2.5 mg by nebulization every 4 (four) hours as needed for wheezing or shortness of breath.    Marland Kitchen albuterol (VENTOLIN HFA) 108 (90 Base) MCG/ACT inhaler Inhale 2 puffs into the lungs every 4 (four) hours as needed for wheezing or shortness of breath. 18 g 5  . amLODipine (NORVASC) 10 MG tablet Take 1 tablet (10 mg total) by  mouth daily. 90 tablet 1  . ANORO ELLIPTA 62.5-25 MCG/INH AEPB Inhale 1 puff into the lungs daily. 14 each 5  . atorvastatin (LIPITOR) 80 MG tablet Take 1 tablet (80 mg total) by mouth daily. 90 tablet 1  . Cholecalciferol (VITAMIN D3) 50 MCG (2000 UT) TABS Take 1 capsule by mouth daily.    . fluconazole (DIFLUCAN) 150 MG tablet Take 150 mg by mouth as needed.    . fluticasone (FLONASE) 50 MCG/ACT nasal spray Place into both nostrils daily.    . fluticasone-salmeterol (ADVAIR HFA) 115-21 MCG/ACT inhaler Inhale 2 puffs into the lungs 2 (two) times daily as needed (wheezing).     . furosemide (LASIX) 20 MG tablet Take 1 tablet (20 mg total) by mouth 2 (two) times daily. 180 tablet 0  . glucose blood (ACCU-CHEK AVIVA PLUS) test strip TEST BLOOD SUGAR ONE TO TWO TIMES A DAY as directed    . HYDROcodone-acetaminophen (NORCO/VICODIN) 5-325 MG tablet Take  one tab po q 4 hrs prn pain 10 tablet 0  . ibuprofen (ADVIL,MOTRIN) 800 MG tablet Take 800 mg by mouth 2 (two) times daily.    . insulin aspart (NOVOLOG) 100 UNIT/ML injection Inject 14-20 Units into the skin 3 (three) times daily before meals. 10 mL 2  . insulin glargine (LANTUS) 100 UNIT/ML injection Inject 80 Units into the skin at bedtime.    . liraglutide (VICTOZA) 18 MG/3ML SOPN Inject 1.8 mg into the skin daily.     Marland Kitchen lisinopril (ZESTRIL) 30 MG tablet Take 1 tablet (30 mg total) by mouth daily. 30 tablet 0  . metFORMIN (GLUCOPHAGE) 1000 MG tablet Take 1,000 mg by mouth 2 (two) times daily with a meal.     . mirtazapine (REMERON) 30 MG tablet Take 30 mg by mouth at bedtime.    Marland Kitchen nystatin cream (MYCOSTATIN) Apply 1 application topically 2 (two) times daily.    Marland Kitchen omeprazole (PRILOSEC) 40 MG capsule Take 40 mg by mouth daily.    Marland Kitchen SSD 1 % cream Apply 1 application topically daily. For rash  0  . Vitamin D, Ergocalciferol, (DRISDOL) 1.25 MG (50000 UNIT) CAPS capsule Take 1 capsule (50,000 Units total) by mouth every 7 (seven) days. 12 capsule 1   No facility-administered medications prior to visit.    Allergies  Allergen Reactions  . Morphine And Related   . Other Other (See Comments)    Bells Palsy   . Oxycodone-Acetaminophen Other (See Comments)    Constipates patient   . Latex Itching and Other (See Comments)    Skin "cracks" open   . Morphine Itching and Other (See Comments)    ROS Review of Systems  Constitutional: Negative for chills and fever.  HENT: Negative for congestion, sinus pressure, sinus pain and sore throat.   Eyes: Negative for pain and discharge.  Respiratory: Positive for shortness of breath and wheezing. Negative for cough.   Cardiovascular: Positive for chest pain. Negative for palpitations.  Gastrointestinal: Negative for abdominal pain, constipation, diarrhea, nausea and vomiting.  Endocrine: Negative for polydipsia and polyuria.  Genitourinary:  Negative for dysuria and hematuria.  Musculoskeletal: Positive for arthralgias and back pain. Negative for neck pain and neck stiffness.  Skin: Negative for rash.  Neurological: Negative for dizziness and weakness.  Psychiatric/Behavioral: Negative for agitation and behavioral problems.      Objective:    Physical Exam Vitals reviewed.  Constitutional:      General: She is not in acute distress.    Appearance: She  is not diaphoretic.  HENT:     Head: Normocephalic and atraumatic.     Nose: Nose normal.     Mouth/Throat:     Mouth: Mucous membranes are moist.  Eyes:     General: No scleral icterus.    Extraocular Movements: Extraocular movements intact.     Pupils: Pupils are equal, round, and reactive to light.  Cardiovascular:     Rate and Rhythm: Normal rate and regular rhythm.     Pulses: Normal pulses.     Heart sounds: Normal heart sounds. No murmur heard.   Pulmonary:     Breath sounds: Wheezing present. No rales.  Musculoskeletal:     Cervical back: Neck supple. No tenderness.     Right lower leg: No edema.     Left lower leg: No edema.  Skin:    General: Skin is warm.     Findings: No rash.  Neurological:     General: No focal deficit present.     Mental Status: She is alert and oriented to person, place, and time.  Psychiatric:        Mood and Affect: Mood normal.        Behavior: Behavior normal.     BP (!) 148/70 (BP Location: Right Arm, Patient Position: Sitting, Cuff Size: Normal)   Pulse (!) 107   Temp 98.4 F (36.9 C) (Oral)   Resp 18   Ht 5' (1.524 m)   Wt 234 lb 6.4 oz (106.3 kg)   SpO2 (!) 88%   BMI 45.78 kg/m  Wt Readings from Last 3 Encounters:  12/16/20 234 lb 6.4 oz (106.3 kg)  12/13/20 232 lb 12.8 oz (105.6 kg)  12/10/20 239 lb (108.4 kg)     Health Maintenance Due  Topic Date Due  . Hepatitis C Screening  Never done  . FOOT EXAM  Never done  . OPHTHALMOLOGY EXAM  Never done  . HIV Screening  Never done  . PAP  SMEAR-Modifier  Never done  . COLONOSCOPY (Pts 45-66yrs Insurance coverage will need to be confirmed)  Never done  . MAMMOGRAM  Never done  . COVID-19 Vaccine (2 - Moderna 3-dose series) 12/11/2020    There are no preventive care reminders to display for this patient.  Lab Results  Component Value Date   TSH 2.640 11/11/2020   Lab Results  Component Value Date   WBC 11.3 (H) 12/10/2020   HGB 13.3 12/10/2020   HCT 45.1 12/10/2020   MCV 82.1 12/10/2020   PLT 248 12/10/2020   Lab Results  Component Value Date   NA 135 12/10/2020   K 3.6 12/10/2020   CO2 29 12/10/2020   GLUCOSE 289 (H) 12/10/2020   BUN 13 12/10/2020   CREATININE 0.97 12/10/2020   BILITOT 0.3 11/11/2020   ALKPHOS 116 11/11/2020   AST 10 11/11/2020   ALT 14 11/11/2020   PROT 5.9 (L) 11/11/2020   ALBUMIN 3.5 (L) 11/11/2020   CALCIUM 8.5 (L) 12/10/2020   ANIONGAP 8 12/10/2020   Lab Results  Component Value Date   CHOL 175 11/11/2020   Lab Results  Component Value Date   HDL 45 11/11/2020   Lab Results  Component Value Date   LDLCALC 102 (H) 11/11/2020   Lab Results  Component Value Date   TRIG 159 (H) 11/11/2020   Lab Results  Component Value Date   CHOLHDL 3.9 11/11/2020   Lab Results  Component Value Date   HGBA1C 9.5 (H) 11/11/2020  Assessment & Plan:   Problem List Items Addressed This Visit      Respiratory   Chronic obstructive pulmonary disease (HCC)    On Anoro Ellipta and Advair Uses Albuterol nebs and inhaler PRN Needs home O2, will try to obtain for her      Relevant Orders   For home use only DME oxygen    Other Visit Diagnoses    Encounter for examination following treatment at hospital    -  Primary ER chart reviewed - left AMA Started Lasix for HFpEF   Relevant Orders   For home use only DME oxygen   Acute on chronic heart failure with preserved ejection fraction (HCC)     Continue Lasix for now F/u with Cardiology   Relevant Orders   For home use only  DME oxygen      No orders of the defined types were placed in this encounter.   Follow-up: Return in about 6 weeks (around 01/27/2021) for COPD and CHF.    Lindell Spar, MD

## 2020-12-16 NOTE — Assessment & Plan Note (Addendum)
On Anoro Ellipta and Advair Uses Albuterol nebs and inhaler PRN Needs home O2, will try to obtain for her

## 2020-12-16 NOTE — Patient Instructions (Signed)
Please continue taking medications as prescribed.  Please follow up with Cardiology and Pulmonology as scheduled.  Please try to keep legs elevated at nighttime and/or sit in a recliner chair.

## 2020-12-17 DIAGNOSIS — J449 Chronic obstructive pulmonary disease, unspecified: Secondary | ICD-10-CM | POA: Diagnosis not present

## 2020-12-17 DIAGNOSIS — I509 Heart failure, unspecified: Secondary | ICD-10-CM | POA: Diagnosis not present

## 2020-12-17 NOTE — Addendum Note (Signed)
Addended by: Zacarias Pontes R on: 12/17/2020 10:50 AM   Modules accepted: Orders

## 2020-12-17 NOTE — Addendum Note (Signed)
Addended by: Zacarias Pontes R on: 12/17/2020 11:27 AM   Modules accepted: Orders

## 2020-12-23 ENCOUNTER — Ambulatory Visit (INDEPENDENT_AMBULATORY_CARE_PROVIDER_SITE_OTHER): Payer: Medicare Other | Admitting: "Endocrinology

## 2020-12-23 ENCOUNTER — Encounter: Payer: Self-pay | Admitting: "Endocrinology

## 2020-12-23 ENCOUNTER — Other Ambulatory Visit: Payer: Self-pay

## 2020-12-23 VITALS — BP 143/84 | HR 106 | Ht 60.0 in | Wt 241.0 lb

## 2020-12-23 DIAGNOSIS — E1165 Type 2 diabetes mellitus with hyperglycemia: Secondary | ICD-10-CM

## 2020-12-23 DIAGNOSIS — E119 Type 2 diabetes mellitus without complications: Secondary | ICD-10-CM

## 2020-12-23 DIAGNOSIS — E782 Mixed hyperlipidemia: Secondary | ICD-10-CM | POA: Diagnosis not present

## 2020-12-23 DIAGNOSIS — I10 Essential (primary) hypertension: Secondary | ICD-10-CM

## 2020-12-23 DIAGNOSIS — Z794 Long term (current) use of insulin: Secondary | ICD-10-CM

## 2020-12-23 MED ORDER — FREESTYLE LIBRE 2 READER DEVI: 0 refills | Status: AC

## 2020-12-23 MED ORDER — INSULIN ASPART 100 UNIT/ML ~~LOC~~ SOLN: 10.0000 [IU] | Freq: Three times a day (TID) | SUBCUTANEOUS | 2 refills | Status: DC

## 2020-12-23 MED ORDER — FREESTYLE LIBRE 2 SENSOR MISC: 1.0000 | 3 refills | Status: DC

## 2020-12-23 NOTE — Progress Notes (Signed)
12/23/2020, 2:46 PM  Endocrinology follow-up note   Subjective:    Patient ID: Christine Weaver, female    DOB: 1966/08/28.  Christine Weaver is being seen in follow-up after she was seen in consultation for management of currently uncontrolled symptomatic diabetes requested by  Lindell Spar, MD.   Past Medical History:  Diagnosis Date  . Asthma   . COPD (chronic obstructive pulmonary disease) ([place])   . Hyperlipidemia   . Hypertension   . Type 2 diabetes mellitus ([place])     Past Surgical History:  Procedure Laterality Date  . CESAREAN SECTION      Social History   Socioeconomic History  . Marital status: Married    Spouse name: Not on file  . Number of children: Not on file  . Years of education: Not on file  . Highest education level: Not on file  Occupational History  . Not on file  Tobacco Use  . Smoking status: Former Smoker    Types: Cigarettes  . Smokeless tobacco: Never Used  Vaping Use  . Vaping Use: Never used  Substance and Sexual Activity  . Alcohol use: Not on file  . Drug use: Not on file  . Sexual activity: Not on file  Other Topics Concern  . Not on file  Social History Narrative  . Not on file   Social Determinants of Health   Financial Resource Strain: Not on file  Food Insecurity: Not on file  Transportation Needs: Not on file  Physical Activity: Not on file  Stress: Not on file  Social Connections: Not on file    Family History  Problem Relation Age of Onset  . Thyroid disease Mother   . Hypertension Mother   . Heart attack Mother   . Stroke Mother   . Heart failure Mother   . Diabetes Mother   . Diabetes Father   . Heart attack Father   . Stroke Father   . Kidney disease Father   . Heart failure Father     Outpatient Encounter Medications as of 12/23/2020  Medication Sig  . Continuous Blood Gluc Receiver (FREESTYLE LIBRE 2 READER) DEVI As directed  . Continuous Blood Gluc Sensor (FREESTYLE LIBRE 2  SENSOR) MISC 1 Piece by Does not apply route every 14 (fourteen) days.  Marland Kitchen albuterol (PROVENTIL) (2.5 MG/3ML) 0.083% nebulizer solution Take 2.5 mg by nebulization every 4 (four) hours as needed for wheezing or shortness of breath.  Marland Kitchen albuterol (VENTOLIN HFA) 108 (90 Base) MCG/ACT inhaler Inhale 2 puffs into the lungs every 4 (four) hours as needed for wheezing or shortness of breath.  Marland Kitchen amLODipine (NORVASC) 10 MG tablet Take 1 tablet (10 mg total) by mouth daily.  Jearl Klinefelter ELLIPTA 62.5-25 MCG/INH AEPB Inhale 1 puff into the lungs daily.  Marland Kitchen atorvastatin (LIPITOR) 80 MG tablet Take 1 tablet (80 mg total) by mouth daily.  . Cholecalciferol (VITAMIN D3) 50 MCG (2000 UT) TABS Take 1 capsule by mouth daily.  . fluconazole (DIFLUCAN) 150 MG tablet Take 150 mg by mouth as needed.  . fluticasone (FLONASE) 50 MCG/ACT nasal spray Place into both nostrils daily.  . fluticasone-salmeterol (ADVAIR HFA) 115-21 MCG/ACT inhaler Inhale 2 puffs into the lungs 2 (two) times daily as needed (wheezing).   . furosemide (LASIX) 20 MG tablet Take 1 tablet (20 mg total) by mouth 2 (two) times daily.  Marland Kitchen glucose blood (ACCU-CHEK AVIVA PLUS) test strip TEST BLOOD SUGAR ONE TO TWO  TIMES A DAY as directed  . HYDROcodone-acetaminophen (NORCO/VICODIN) 5-325 MG tablet Take one tab po q 4 hrs prn pain  . ibuprofen (ADVIL,MOTRIN) 800 MG tablet Take 800 mg by mouth 2 (two) times daily.  . insulin aspart (NOVOLOG) 100 UNIT/ML injection Inject 10-16 Units into the skin 3 (three) times daily before meals.  . insulin glargine (LANTUS) 100 UNIT/ML injection Inject 70 Units into the skin at bedtime.  . liraglutide (VICTOZA) 18 MG/3ML SOPN Inject 1.8 mg into the skin daily.   Marland Kitchen lisinopril (ZESTRIL) 30 MG tablet Take 1 tablet (30 mg total) by mouth daily.  . metFORMIN (GLUCOPHAGE) 1000 MG tablet Take 1,000 mg by mouth 2 (two) times daily with a meal.   . mirtazapine (REMERON) 30 MG tablet Take 30 mg by mouth at bedtime.  Marland Kitchen nystatin cream  (MYCOSTATIN) Apply 1 application topically 2 (two) times daily.  Marland Kitchen omeprazole (PRILOSEC) 40 MG capsule Take 40 mg by mouth daily.  Marland Kitchen SSD 1 % cream Apply 1 application topically daily. For rash  . Vitamin D, Ergocalciferol, (DRISDOL) 1.25 MG (50000 UNIT) CAPS capsule Take 1 capsule (50,000 Units total) by mouth every 7 (seven) days.  . [DISCONTINUED] insulin aspart (NOVOLOG) 100 UNIT/ML injection Inject 14-20 Units into the skin 3 (three) times daily before meals.   No facility-administered encounter medications on file as of 12/23/2020.    ALLERGIES: Allergies  Allergen Reactions  . Morphine And Related   . Other Other (See Comments)    Bells Palsy   . Oxycodone-Acetaminophen Other (See Comments)    Constipates patient   . Latex Itching and Other (See Comments)    Skin "cracks" open   . Morphine Itching and Other (See Comments)    VACCINATION STATUS: Immunization History  Administered Date(s) Administered  . Moderna Sars-Covid-2 Vaccination 11/13/2020  . Tdap 03/13/2018    Diabetes She presents for her follow-up diabetic visit. She has type 2 diabetes mellitus. Onset time: She was diagnosed at approximately age of 55 years. Her disease course has been improving. There are no hypoglycemic associated symptoms. Pertinent negatives for hypoglycemia include no confusion, headaches, pallor or seizures. Associated symptoms include fatigue, polydipsia and polyuria. Pertinent negatives for diabetes include no chest pain and no polyphagia. There are no hypoglycemic complications. Symptoms are improving. Risk factors for coronary artery disease include dyslipidemia, diabetes mellitus, hypertension, obesity, sedentary lifestyle and tobacco exposure. Current diabetic treatment includes insulin injections. Her weight is fluctuating minimally. She is following a generally unhealthy diet. When asked about meal planning, she reported none. She has not had a previous visit with a dietitian. She never  participates in exercise. Her home blood glucose trend is decreasing steadily. Her breakfast blood glucose range is generally 110-130 mg/dl. Her lunch blood glucose range is generally 130-140 mg/dl. Her dinner blood glucose range is generally 130-140 mg/dl. Her bedtime blood glucose range is generally 130-140 mg/dl. Her overall blood glucose range is 130-140 mg/dl. (She presents with significant improvement in her glycemic profile tightening to the near target levels.  She has 2 hypoglycemic episodes.  Her recent A1c was 9.5%.   ) An ACE inhibitor/angiotensin II receptor blocker is being taken. Eye exam is current.  Hyperlipidemia This is a chronic problem. The problem is uncontrolled. Recent lipid tests were reviewed and are high. Exacerbating diseases include diabetes and obesity. Pertinent negatives include no chest pain, myalgias or shortness of breath. Current antihyperlipidemic treatment includes statins. Risk factors for coronary artery disease include dyslipidemia, diabetes mellitus, family history,  hypertension, obesity and a sedentary lifestyle.  Hypertension This is a chronic problem. The current episode started more than 1 year ago. The problem is uncontrolled. Pertinent negatives include no chest pain, headaches, palpitations or shortness of breath. Risk factors for coronary artery disease include diabetes mellitus, dyslipidemia, obesity, sedentary lifestyle, smoking/tobacco exposure and family history. Past treatments include ACE inhibitors.   Review of systems: Limited as above.    Objective:    Vitals with BMI 12/23/2020 12/16/2020 12/13/2020  Height 5\' 0"  5\' 0"  5\' 0"   Weight 241 lbs 234 lbs 6 oz 232 lbs 13 oz  BMI 47.07 10.17 51.02  Systolic 585 277 824  Diastolic 84 70 74  Pulse 235 107 100    BP (!) 143/84   Pulse (!) 106   Ht 5' (1.524 m)   Wt 241 lb (109.3 kg)   BMI 47.07 kg/m   Wt Readings from Last 3 Encounters:  12/23/20 241 lb (109.3 kg)  12/16/20 234 lb 6.4 oz  (106.3 kg)  12/13/20 232 lb 12.8 oz (105.6 kg)        CMP ( most recent) CMP     Component Value Date/Time   NA 135 12/10/2020 1820   NA 140 11/11/2020 0817   K 3.6 12/10/2020 1820   CL 98 12/10/2020 1820   CO2 29 12/10/2020 1820   GLUCOSE 289 (H) 12/10/2020 1820   BUN 13 12/10/2020 1820   BUN 13 11/11/2020 0817   CREATININE 0.97 12/10/2020 1820   CALCIUM 8.5 (L) 12/10/2020 1820   PROT 5.9 (L) 11/11/2020 0817   ALBUMIN 3.5 (L) 11/11/2020 0817   AST 10 11/11/2020 0817   ALT 14 11/11/2020 0817   ALKPHOS 116 11/11/2020 0817   BILITOT 0.3 11/11/2020 0817   GFRNONAA >60 12/10/2020 1820   GFRAA 80 11/11/2020 0817     Diabetic Labs (most recent): Lab Results  Component Value Date   HGBA1C 9.5 (H) 11/11/2020   HGBA1C 9.3 04/22/2020   HGBA1C 10.3 (A) 12/15/2019     Lipid Panel ( most recent) Lipid Panel     Component Value Date/Time   CHOL 175 11/11/2020 0817   TRIG 159 (H) 11/11/2020 0817   HDL 45 11/11/2020 0817   CHOLHDL 3.9 11/11/2020 0817   LDLCALC 102 (H) 11/11/2020 0817   LABVLDL 28 11/11/2020 0817      Lab Results  Component Value Date   TSH 2.640 11/11/2020   TSH 1.42 09/23/2019   FREET4 1.28 11/11/2020      Assessment & Plan:   1. Type 2 diabetes mellitus with hyperglycemia, with long-term current use of insulin ([facility]CC)  - [place] has currently uncontrolled symptomatic type 2 DM since  55 years of age.   She presents with significant improvement in her glycemic profile tightening to the near target levels.  She has 2 hypoglycemic episodes.  Her recent A1c was 9.5%.     Recent labs reviewed.  - I had a long discussion with her about the progressive nature of diabetes and the pathology behind its complications. -her diabetes is complicated by obesity/sedentary life, smoking and she remains at a high risk for more acute and chronic complications which include CAD, CVA, CKD, retinopathy, and neuropathy. These are all discussed in detail with  her.  - I have counseled her on diet  and weight management  by adopting a carbohydrate restricted/protein rich diet. Patient is encouraged to switch to  unprocessed or minimally processed     complex starch and  increased protein intake (animal or plant source), fruits, and vegetables. -  she is advised to stick to a routine mealtimes to eat 3 meals  a day and avoid unnecessary snacks ( to snack only to correct hypoglycemia).   - she acknowledges that there is a room for improvement in her food and drink choices. - Suggestion is made for her to avoid simple carbohydrates  from her diet including Cakes, Sweet Desserts, Ice Cream, Soda (diet and regular), Sweet Tea, Candies, Chips, Cookies, Store Bought Juices, Alcohol in Excess of  1-2 drinks a day, Artificial Sweeteners,  Coffee Creamer, and "Sugar-free" Products, Lemonade. This will help patient to have more stable blood glucose profile and potentially avoid unintended weight gain.   - she will be scheduled with Jearld Fenton, RDN, CDE for diabetes education.  - I have approached her with the following individualized plan to manage  her diabetes and patient agrees:   -Given her current and prevailing glycemic burden, she will continue to need intensive treatment with basal/bolus insulin in order for her to achieve and maintain control of diabetes to target.   -In light of her presentation with tightening glycemic profile, she is advised to lower her Lantus to 70 units nightly, lower NovoLog to 10 units  3 times a day with meals  for pre-meal BG readings of 90-150mg /dl, plus patient specific correction dose for unexpected hyperglycemia above 150mg /dl, associated with strict monitoring of glucose 4 times a day-before meals and at bedtime. This patient will benefit from a CGM.  I discussed and prescribed the freestyle libre device for her.     - she is warned not to take insulin without proper monitoring per orders. - Adjustment parameters are  given to her for hypo and hyperglycemia in writing. - she is encouraged to call clinic for blood glucose levels less than 70 or above 300 mg /dl. - she is advised to continue Metformin 1000 mg p.o. twice daily, Victoza 1.8 mg subcutaneously daily,  therapeutically suitable for patient .  - Specific targets for  A1c;  LDL, HDL,  and Triglycerides were discussed with the patient.  2) Blood Pressure /Hypertension:   -Her blood pressure is not controlled to target.   she is advised to continue her current medications including lisinopril 20 mg p.o. daily with breakfast . 3) Lipids/Hyperlipidemia:   Review of her recent lipid panel showed un controlled  LDL at 102.  She is advised to continue atorvastatin 80 mg p.o. daily at bedtime.    Side effects and precautions discussed with her.  4)  Weight/Diet: Her BMI is 47  -  clearly complicating her diabetes care.   she is  a candidate for weight loss. I discussed with her the fact that loss of 5 - 10% of her  current body weight will have the most impact on her diabetes management.  Exercise, and detailed carbohydrates information provided  -  detailed on discharge instructions.  5) Chronic Care/Health Maintenance:  -she  is on ACEI/ARB and Statin medications and  is encouraged to initiate and continue to follow up with Ophthalmology, Dentist,  Podiatrist at least yearly or according to recommendations, and advised to  quit smoking. I have recommended yearly flu vaccine and pneumonia vaccine at least every 5 years; moderate intensity exercise for up to 150 minutes weekly; and  sleep for at least 7 hours a day.   POCT ABI Results 12/23/20  Her ABIs normal today Right ABI: 1.34  left ABI: 1.36  Right leg systolic / diastolic: 741/638 mmHg Left leg systolic / diastolic: 453/64 mmHg  Arm systolic / diastolic: 680/32 mmHG Her neck study will be due in February 2027, or sooner if needed.  - she is  advised to maintain close follow up with Lindell Spar, MD for primary care needs, as well as her other providers for optimal and coordinated care.  - Time spent on this patient care encounter:  40 min, of which > 50% was spent in  counseling and the rest reviewing her blood glucose logs , discussing her hypoglycemia and hyperglycemia episodes, reviewing her current and  previous labs / studies  ( including abstraction from other facilities) and medications  doses and developing a  long term treatment plan and documenting her care.   Please refer to Patient Instructions for Blood Glucose Monitoring and Insulin/Medications Dosing Guide"  in media tab for additional information. Please  also refer to " Patient Self Inventory" in the Media  tab for reviewed elements of pertinent patient history.  Christine Weaver participated in the discussions, expressed understanding, and voiced agreement with the above plans.  All questions were answered to her satisfaction. she is encouraged to contact clinic should she have any questions or concerns prior to her return visit.     Follow up plan: - Return in about 9 weeks (around 02/24/2021) for Bring Meter and Logs- A1c in Office.  Glade Lloyd, MD [facility] [address] [place], [place] [address] Phone: [phone]  Fax: [phone]    12/23/2020, 2:46 PM  This note was partially dictated with voice recognition software. Similar sounding words can be transcribed inadequately or may not  be corrected upon review.

## 2020-12-23 NOTE — Patient Instructions (Signed)

## 2021-01-04 DIAGNOSIS — E1142 Type 2 diabetes mellitus with diabetic polyneuropathy: Secondary | ICD-10-CM | POA: Diagnosis not present

## 2021-01-04 DIAGNOSIS — M5431 Sciatica, right side: Secondary | ICD-10-CM | POA: Diagnosis not present

## 2021-01-04 DIAGNOSIS — M542 Cervicalgia: Secondary | ICD-10-CM | POA: Diagnosis not present

## 2021-01-04 DIAGNOSIS — M545 Low back pain, unspecified: Secondary | ICD-10-CM | POA: Diagnosis not present

## 2021-01-04 DIAGNOSIS — M546 Pain in thoracic spine: Secondary | ICD-10-CM | POA: Diagnosis not present

## 2021-01-04 DIAGNOSIS — Z79891 Long term (current) use of opiate analgesic: Secondary | ICD-10-CM | POA: Diagnosis not present

## 2021-01-04 DIAGNOSIS — G894 Chronic pain syndrome: Secondary | ICD-10-CM | POA: Diagnosis not present

## 2021-01-04 DIAGNOSIS — R202 Paresthesia of skin: Secondary | ICD-10-CM | POA: Diagnosis not present

## 2021-01-04 DIAGNOSIS — G8929 Other chronic pain: Secondary | ICD-10-CM | POA: Diagnosis not present

## 2021-01-04 DIAGNOSIS — M5432 Sciatica, left side: Secondary | ICD-10-CM | POA: Diagnosis not present

## 2021-01-12 ENCOUNTER — Ambulatory Visit (INDEPENDENT_AMBULATORY_CARE_PROVIDER_SITE_OTHER): Payer: Medicare Other | Admitting: Student

## 2021-01-12 ENCOUNTER — Encounter: Payer: Self-pay | Admitting: Student

## 2021-01-12 ENCOUNTER — Other Ambulatory Visit: Payer: Self-pay

## 2021-01-12 ENCOUNTER — Other Ambulatory Visit (HOSPITAL_COMMUNITY)
Admission: RE | Admit: 2021-01-12 | Discharge: 2021-01-12 | Disposition: A | Discharge status: Home / Self Care | Payer: Medicare Other | Source: Ambulatory Visit | Attending: Student | Admitting: Student

## 2021-01-12 VITALS — BP 132/60 | HR 84 | Ht 61.0 in | Wt 227.0 lb

## 2021-01-12 DIAGNOSIS — Z79899 Other long term (current) drug therapy: Secondary | ICD-10-CM

## 2021-01-12 DIAGNOSIS — Z794 Long term (current) use of insulin: Secondary | ICD-10-CM | POA: Insufficient documentation

## 2021-01-12 DIAGNOSIS — E785 Hyperlipidemia, unspecified: Secondary | ICD-10-CM | POA: Diagnosis not present

## 2021-01-12 DIAGNOSIS — J90 Pleural effusion, not elsewhere classified: Secondary | ICD-10-CM | POA: Insufficient documentation

## 2021-01-12 DIAGNOSIS — I119 Hypertensive heart disease without heart failure: Secondary | ICD-10-CM

## 2021-01-12 DIAGNOSIS — E782 Mixed hyperlipidemia: Secondary | ICD-10-CM

## 2021-01-12 DIAGNOSIS — E119 Type 2 diabetes mellitus without complications: Secondary | ICD-10-CM | POA: Insufficient documentation

## 2021-01-12 DIAGNOSIS — J449 Chronic obstructive pulmonary disease, unspecified: Secondary | ICD-10-CM | POA: Diagnosis not present

## 2021-01-12 DIAGNOSIS — I10 Essential (primary) hypertension: Secondary | ICD-10-CM | POA: Diagnosis not present

## 2021-01-12 DIAGNOSIS — Z87891 Personal history of nicotine dependence: Secondary | ICD-10-CM | POA: Diagnosis not present

## 2021-01-12 LAB — BASIC METABOLIC PANEL
Anion gap: 12 (ref 5–15)
BUN: 12 mg/dL (ref 6–20)
CO2: 29 mmol/L (ref 22–32)
Calcium: 9 mg/dL (ref 8.9–10.3)
Chloride: 100 mmol/L (ref 98–111)
Creatinine, Ser: 0.95 mg/dL (ref 0.44–1.00)
GFR, Estimated: >60 mL/min (ref >60)
Glucose, Bld: 176 mg/dL — ABNORMAL HIGH (ref 70–99)
Potassium: 4.2 mmol/L (ref 3.5–5.1)
Sodium: 141 mmol/L (ref 135–145)

## 2021-01-12 LAB — BRAIN NATRIURETIC PEPTIDE: B Natriuretic Peptide: 27 pg/mL (ref 0.0–100.0)

## 2021-01-12 NOTE — Progress Notes (Signed)
Cardiology Office Note    Date:  01/12/2021   ID:  Christine Weaver, DOB 10-13-1966, MRN [id number]  PCP:  Lindell Spar, MD  Cardiologist: Rozann Lesches, MD    Chief Complaint  Patient presents with  . Follow-up    1 month visit    History of Present Illness:    Christine Weaver is a 55 y.o. female with past medical history of HTN, HLD, Type 2 DM and COPD who presents to the office today for 40-month follow-up.  She was last examined by Dr. Domenic Polite on 12/10/2020 for follow-up from a recent Emergency Department visit for shortness of breath. Labs were unrevealing and CXR showed cardiomegaly and pulmonary vascular congestion. An echo was performed and showed a preserved EF of 55-60% with mild LVH and no regional WMA. Admission was recommended but she left AMA. Oxygen saturations were still dropping into the 80's at the time of her office visit and it was recommended she go back to the ED for further evaluation and would also likely require at least a CTA.  Repeat labs showed WBC 11.0, Hgb 14.7, K+ 4.0, creatinine 0.86, BNP 73 and HS Troponin 10. CXR showed cardiomegaly and pulmonary vascular congestion with small pleural effusions. CTA was negative for PE but did show bilateral pleural effusions and mild cardiomegaly. The report mentioned no visible coronary artery calcification and no aortic atherosclerotic calcification. Given her volume overload and pleural effusions, admission was recommended but she left AMA as she had no one to watch her children.  In talking with the patient today, she reports her breathing has significantly improved since her last visit. She is on 2 L nasal cannula at baseline but is no longer having orthopnea or PND. Saturations are at 94% during today's visit. No recent exertional chest pain or palpitations. She has been using compression stockings with improvement in her symptoms. She does not typically add salt to her food but does eat out at [employer] on  occasion. She has been taking Lasix 20 mg twice daily and reports good urination with this. Her weight has declined on her home scales and by review of prior weights was previously at 241 lbs last month and is now down to 227 lbs today.   Past Medical History:  Diagnosis Date  . Asthma   . COPD (chronic obstructive pulmonary disease) ([place])   . Hyperlipidemia   . Hypertension   . Type 2 diabetes mellitus ([place])     Past Surgical History:  Procedure Laterality Date  . CESAREAN SECTION      Current Medications: Outpatient Medications Prior to Visit  Medication Sig Dispense Refill  . albuterol (PROVENTIL) (2.5 MG/3ML) 0.083% nebulizer solution Take 2.5 mg by nebulization every 4 (four) hours as needed for wheezing or shortness of breath.    Marland Kitchen albuterol (VENTOLIN HFA) 108 (90 Base) MCG/ACT inhaler Inhale 2 puffs into the lungs every 4 (four) hours as needed for wheezing or shortness of breath. 18 g 5  . amLODipine (NORVASC) 10 MG tablet Take 1 tablet (10 mg total) by mouth daily. 90 tablet 1  . ANORO ELLIPTA 62.5-25 MCG/INH AEPB Inhale 1 puff into the lungs daily. 14 each 5  . atorvastatin (LIPITOR) 80 MG tablet Take 1 tablet (80 mg total) by mouth daily. 90 tablet 1  . Cholecalciferol (VITAMIN D3) 50 MCG (2000 UT) TABS Take 1 capsule by mouth daily.    . Continuous Blood Gluc Receiver (FREESTYLE LIBRE 2 READER) DEVI As directed 1 each  0  . Continuous Blood Gluc Sensor (FREESTYLE LIBRE 2 SENSOR) MISC 1 Piece by Does not apply route every 14 (fourteen) days. 2 each 3  . fluconazole (DIFLUCAN) 150 MG tablet Take 150 mg by mouth as needed.    . fluticasone (FLONASE) 50 MCG/ACT nasal spray Place into both nostrils daily.    . fluticasone-salmeterol (ADVAIR HFA) 115-21 MCG/ACT inhaler Inhale 2 puffs into the lungs 2 (two) times daily as needed (wheezing).     . furosemide (LASIX) 20 MG tablet Take 1 tablet (20 mg total) by mouth 2 (two) times daily. 180 tablet 0  . glucose blood (ACCU-CHEK AVIVA  PLUS) test strip TEST BLOOD SUGAR ONE TO TWO TIMES A DAY as directed    . HYDROcodone-acetaminophen (NORCO/VICODIN) 5-325 MG tablet Take one tab po q 4 hrs prn pain 10 tablet 0  . ibuprofen (ADVIL,MOTRIN) 800 MG tablet Take 800 mg by mouth 2 (two) times daily.    . insulin aspart (NOVOLOG) 100 UNIT/ML injection Inject 10-16 Units into the skin 3 (three) times daily before meals. 15 mL 2  . insulin glargine (LANTUS) 100 UNIT/ML injection Inject 70 Units into the skin at bedtime.    . liraglutide (VICTOZA) 18 MG/3ML SOPN Inject 1.8 mg into the skin daily.     Marland Kitchen lisinopril (ZESTRIL) 30 MG tablet Take 1 tablet (30 mg total) by mouth daily. 30 tablet 0  . metFORMIN (GLUCOPHAGE) 1000 MG tablet Take 1,000 mg by mouth 2 (two) times daily with a meal.     . mirtazapine (REMERON) 30 MG tablet Take 30 mg by mouth at bedtime.    Marland Kitchen nystatin cream (MYCOSTATIN) Apply 1 application topically 2 (two) times daily.    Marland Kitchen omeprazole (PRILOSEC) 40 MG capsule Take 40 mg by mouth daily.    Marland Kitchen SSD 1 % cream Apply 1 application topically daily. For rash  0  . Vitamin D, Ergocalciferol, (DRISDOL) 1.25 MG (50000 UNIT) CAPS capsule Take 1 capsule (50,000 Units total) by mouth every 7 (seven) days. 12 capsule 1   No facility-administered medications prior to visit.     Allergies:   Morphine and related, Other, Oxycodone-acetaminophen, Latex, and Morphine   Social History   Socioeconomic History  . Marital status: Married    Spouse name: Not on file  . Number of children: Not on file  . Years of education: Not on file  . Highest education level: Not on file  Occupational History  . Not on file  Tobacco Use  . Smoking status: Former Smoker    Types: Cigarettes  . Smokeless tobacco: Never Used  Vaping Use  . Vaping Use: Never used  Substance and Sexual Activity  . Alcohol use: Not on file  . Drug use: Not on file  . Sexual activity: Not on file  Other Topics Concern  . Not on file  Social History Narrative   . Not on file   Social Determinants of Health   Financial Resource Strain: Not on file  Food Insecurity: Not on file  Transportation Needs: Not on file  Physical Activity: Not on file  Stress: Not on file  Social Connections: Not on file     Family History:  The patient's family history includes Diabetes in her father and mother; Heart attack in her father and mother; Heart failure in her father and mother; Hypertension in her mother; Kidney disease in her father; Stroke in her father and mother; Thyroid disease in her mother.   Review of Systems:  Please see the history of present illness.     General:  No chills, fever, night sweats or weight changes.  Cardiovascular:  No chest pain, orthopnea, palpitations, paroxysmal nocturnal dyspnea. Positive for dyspnea on exertion and edema.  Dermatological: No rash, lesions/masses Respiratory: No cough. Urologic: No hematuria, dysuria Abdominal:   No nausea, vomiting, diarrhea, bright red blood per rectum, melena, or hematemesis Neurologic:  No visual changes, wkns, changes in mental status. All other systems reviewed and are otherwise negative except as noted above.   Physical Exam:    VS:  BP 132/60   Pulse 84   Ht 5\' 1"  (1.549 m)   Wt 227 lb (103 kg)   SpO2 94%   BMI 42.89 kg/m    General: Well developed, well nourished,female appearing in no acute distress. Head: Normocephalic, atraumatic. Neck: No carotid bruits. JVD not elevated.  Lungs: Respirations regular and unlabored, mild rales along right base. On 2 L [place] Heart: Regular rate and rhythm. No S3 or S4.  No murmur, no rubs, or gallops appreciated. Abdomen: Appears non-distended. No obvious abdominal masses. Msk:  Strength and tone appear normal for age. No obvious joint deformities or effusions. Extremities: No clubbing or cyanosis. Trace lower extremity edema.  Distal pedal pulses are 2+ bilaterally. Neuro: Alert and oriented X 3. Moves all extremities spontaneously.  No focal deficits noted. Psych:  Responds to questions appropriately with a normal affect. Skin: No rashes or lesions noted  Wt Readings from Last 3 Encounters:  01/12/21 227 lb (103 kg)  12/23/20 241 lb (109.3 kg)  12/16/20 234 lb 6.4 oz (106.3 kg)     Studies/Labs Reviewed:   EKG:  EKG is not ordered today.    Recent Labs: 11/11/2020: ALT 14; TSH 2.640 12/09/2020: Magnesium 1.8 12/10/2020: Hemoglobin 13.3; Platelets 248 01/12/2021: B Natriuretic Peptide 27.0; BUN 12; Creatinine, Ser 0.95; Potassium 4.2; Sodium 141   Lipid Panel    Component Value Date/Time   CHOL 175 11/11/2020 0817   TRIG 159 (H) 11/11/2020 0817   HDL 45 11/11/2020 0817   CHOLHDL 3.9 11/11/2020 0817   LDLCALC 102 (H) 11/11/2020 0817    Additional studies/ records that were reviewed today include:   Echocardiogram: 12/09/2020  IMPRESSIONS    1. Left ventricular ejection fraction, by estimation, is 55 to 60%. The  left ventricle has normal function. The left ventricle has no regional  wall motion abnormalities. There is mild left ventricular hypertrophy.  Left ventricular diastolic parameters  are indeterminate. Elevated left atrial pressure.  2. Right ventricular systolic function is normal. The right ventricular  size is normal.  3. The mitral valve was not well visualized. No evidence of mitral valve  regurgitation. No evidence of mitral stenosis.  4. The aortic valve has an indeterminant number of cusps. Aortic valve  regurgitation is not visualized. No aortic stenosis is present.  5. The inferior vena cava is normal in size with greater than 50%  respiratory variability, suggesting right atrial pressure of 3 mmHg.   Assessment:    1. Pleural effusion   2. Hypertensive heart disease without heart failure   3. Medication management   4. Essential hypertension   5. Mixed hyperlipidemia   6. Chronic obstructive pulmonary disease, unspecified COPD type ([place])      Plan:   In order of  problems listed above:  1. Pleural Effusions/ Hypertensive Heart Disease - Recent echocardiogram showed a preserved EF as outlined above but she did have mild LVH. Prior imaging showed  bilateral pleural effusions but BNP was normal. Lasix was titrated at that time by the ED provider to 20 mg twice daily and her weight has declined from 241 lbs to 227 lbs and she reports improvement in her edema and respiratory status. She does have mild rales along her right base today, therefore will continue Lasix at current dosing for now. Will recheck a BMET and BNP today. She is not currently on K+ supplementation so may need to add this pending her lab results.  - Continued importance of monitoring sodium and fluid intake was reviewed.   2. HTN - BP is well controlled at 132/60 during today's visit. Continue current medication regimen with Amlodipine 10 mg daily and Lisinopril 30 mg daily.  3. HLD - Followed by PCP. FLP in 10/2020 showed total cholesterol 175, triglycerides 159, HDL 45 and LDL 102 with Atorvastatin being titrated. Remains on Atorvastatin 80 mg daily at this time and would recheck FLP with next set of fasting labs.   4. COPD - She remains on 2L [place] at baseline. Says she is not having to use her Albuterol as frequently since her volume status has improved.     Medication Adjustments/Labs and Tests Ordered: Current medicines are reviewed at length with the patient today.  Concerns regarding medicines are outlined above.  Medication changes, Labs and Tests ordered today are listed in the Patient Instructions below. Patient Instructions  Medication Instructions:  Your physician recommends that you continue on your current medications as directed. Please refer to the Current Medication list given to you today.  *If you need a refill on your cardiac medications before your next appointment, please call your pharmacy*   Lab Work: Your physician recommends that you return for lab work in: Today  ( BMET, BNP)   If you have labs (blood work) drawn today and your tests are completely normal, you will receive your results only by: Marland Kitchen MyChart Message (if you have MyChart) OR . A paper copy in the mail If you have any lab test that is abnormal or we need to change your treatment, we will call you to review the results.   Testing/Procedures: NONE   Follow-Up: At [facility], you and your health needs are our priority.  As part of our continuing mission to provide you with exceptional heart care, we have created designated Provider Care Teams.  These Care Teams include your primary Cardiologist (physician) and Advanced Practice Providers (APPs -  Physician Assistants and Nurse Practitioners) who all work together to provide you with the care you need, when you need it.  We recommend signing up for the patient portal called "MyChart".  Sign up information is provided on this After Visit Summary.  MyChart is used to connect with patients for Virtual Visits (Telemedicine).  Patients are able to view lab/test results, encounter notes, upcoming appointments, etc.  Non-urgent messages can be sent to your provider as well.   To learn more about what you can do with MyChart, go to [email or web address].    Your next appointment:   3 month(s)  The format for your next appointment:   In Person  Provider:   Rozann Lesches, MD   Other Instructions Thank you for choosing [facility]!       Signed, Erma Heritage, PA-C  01/12/2021 5:05 PM    [facility]. [address] [place], [place] [address] Phone: [phone] Fax: [phone]

## 2021-01-12 NOTE — Patient Instructions (Signed)
Medication Instructions:  Your physician recommends that you continue on your current medications as directed. Please refer to the Current Medication list given to you today.  *If you need a refill on your cardiac medications before your next appointment, please call your pharmacy*   Lab Work: Your physician recommends that you return for lab work in: Today ( BMET, BNP)   If you have labs (blood work) drawn today and your tests are completely normal, you will receive your results only by: Marland Kitchen MyChart Message (if you have MyChart) OR . A paper copy in the mail If you have any lab test that is abnormal or we need to change your treatment, we will call you to review the results.   Testing/Procedures: NONE   Follow-Up: At [facility], you and your health needs are our priority.  As part of our continuing mission to provide you with exceptional heart care, we have created designated Provider Care Teams.  These Care Teams include your primary Cardiologist (physician) and Advanced Practice Providers (APPs -  Physician Assistants and Nurse Practitioners) who all work together to provide you with the care you need, when you need it.  We recommend signing up for the patient portal called "MyChart".  Sign up information is provided on this After Visit Summary.  MyChart is used to connect with patients for Virtual Visits (Telemedicine).  Patients are able to view lab/test results, encounter notes, upcoming appointments, etc.  Non-urgent messages can be sent to your provider as well.   To learn more about what you can do with MyChart, go to [email or web address].    Your next appointment:   3 month(s)  The format for your next appointment:   In Person  Provider:   Rozann Lesches, MD   Other Instructions Thank you for choosing [facility]!

## 2021-01-14 DIAGNOSIS — J449 Chronic obstructive pulmonary disease, unspecified: Secondary | ICD-10-CM | POA: Diagnosis not present

## 2021-01-14 DIAGNOSIS — I509 Heart failure, unspecified: Secondary | ICD-10-CM | POA: Diagnosis not present

## 2021-01-23 ENCOUNTER — Other Ambulatory Visit: Payer: Self-pay | Admitting: Cardiology

## 2021-01-27 ENCOUNTER — Ambulatory Visit (INDEPENDENT_AMBULATORY_CARE_PROVIDER_SITE_OTHER): Payer: Medicare Other | Admitting: Internal Medicine

## 2021-01-27 ENCOUNTER — Encounter: Payer: Self-pay | Admitting: Internal Medicine

## 2021-01-27 ENCOUNTER — Other Ambulatory Visit: Payer: Self-pay

## 2021-01-27 VITALS — BP 156/80 | HR 102 | Resp 20 | Ht 60.0 in | Wt 215.1 lb

## 2021-01-27 DIAGNOSIS — E7849 Other hyperlipidemia: Secondary | ICD-10-CM | POA: Diagnosis not present

## 2021-01-27 DIAGNOSIS — I5032 Chronic diastolic (congestive) heart failure: Secondary | ICD-10-CM

## 2021-01-27 DIAGNOSIS — Z124 Encounter for screening for malignant neoplasm of cervix: Secondary | ICD-10-CM

## 2021-01-27 DIAGNOSIS — K219 Gastro-esophageal reflux disease without esophagitis: Secondary | ICD-10-CM | POA: Diagnosis not present

## 2021-01-27 DIAGNOSIS — Z1231 Encounter for screening mammogram for malignant neoplasm of breast: Secondary | ICD-10-CM | POA: Diagnosis not present

## 2021-01-27 DIAGNOSIS — Z114 Encounter for screening for human immunodeficiency virus [HIV]: Secondary | ICD-10-CM

## 2021-01-27 DIAGNOSIS — E119 Type 2 diabetes mellitus without complications: Secondary | ICD-10-CM

## 2021-01-27 DIAGNOSIS — Z794 Long term (current) use of insulin: Secondary | ICD-10-CM | POA: Diagnosis not present

## 2021-01-27 DIAGNOSIS — Z1159 Encounter for screening for other viral diseases: Secondary | ICD-10-CM | POA: Diagnosis not present

## 2021-01-27 DIAGNOSIS — J449 Chronic obstructive pulmonary disease, unspecified: Secondary | ICD-10-CM | POA: Diagnosis not present

## 2021-01-27 DIAGNOSIS — I10 Essential (primary) hypertension: Secondary | ICD-10-CM

## 2021-01-27 MED ORDER — LISINOPRIL 30 MG PO TABS: 30.0000 mg | ORAL_TABLET | Freq: Every day | ORAL | 1 refills | Status: DC

## 2021-01-27 MED ORDER — ATORVASTATIN CALCIUM 80 MG PO TABS: 80.0000 mg | ORAL_TABLET | Freq: Every day | ORAL | 1 refills | Status: DC

## 2021-01-27 MED ORDER — OMEPRAZOLE 40 MG PO CPDR: 40.0000 mg | DELAYED_RELEASE_CAPSULE | Freq: Every day | ORAL | 1 refills | Status: DC

## 2021-01-27 MED ORDER — AMLODIPINE BESYLATE 10 MG PO TABS: 10.0000 mg | ORAL_TABLET | Freq: Every day | ORAL | 1 refills | Status: DC

## 2021-01-27 NOTE — Assessment & Plan Note (Addendum)
On Anoro Ellipta and Advair Uses Albuterol nebs and inhaler PRN On home O2 - 2 lpm

## 2021-01-27 NOTE — Assessment & Plan Note (Signed)
Lab Results  Component Value Date   HGBA1C 9.5 (H) 11/11/2020   Takes Lantus 70 U qHS, Novolog 14-20 U TID according to sliding scale Trulicity 1.8 mg QD Metformin 1000 mg BID Follows up with Dr Dorris Fetch On statin and ACEi Referred to Ophthalmology for diabetic eye exam

## 2021-01-27 NOTE — Assessment & Plan Note (Signed)
Appears euvolemic Continue Lasix Follows up with Cardiologist

## 2021-01-27 NOTE — Patient Instructions (Signed)
Please continue taking medications as prescribed.  Please continue to follow low carbohydrate diet and ambulate as tolerated.

## 2021-01-27 NOTE — Progress Notes (Signed)
Established Patient Office Visit  Subjective:  Patient ID: Christine Weaver, female    DOB: 1966-03-22  Age: 55 y.o. MRN: [id number]  CC:  Chief Complaint  Patient presents with  . Follow-up    6 week follow up copd chf breathing has been better and she is losing the water weight  has been trying to make appt for eye exam waiting for them to call her     HPI Christine Weaver is a 55 y.o.femalewith PMH of COPD, HFpEF and type 2 DM who presents for follows up of her chronic medical conditions.  HTN: She had run out of her Lisinopril. Her BP was elevated today. She denies any headache, dizziness, chest pain, dyspnea or palpitations.  She has been feeling tired lately. Denies any constipation, diarrhea, or hair or skin changes.  HFpEF: She denies orthopnea or PND. Takes Lasix regularly.  COPD: On home O2 - 2 lpm now. Denies dyspnea or wheezing currently.  Past Medical History:  Diagnosis Date  . Asthma   . COPD (chronic obstructive pulmonary disease) ([place])   . Hyperlipidemia   . Hypertension   . Type 2 diabetes mellitus ([place])     Past Surgical History:  Procedure Laterality Date  . CESAREAN SECTION      Family History  Problem Relation Age of Onset  . Thyroid disease Mother   . Hypertension Mother   . Heart attack Mother   . Stroke Mother   . Heart failure Mother   . Diabetes Mother   . Diabetes Father   . Heart attack Father   . Stroke Father   . Kidney disease Father   . Heart failure Father     Social History   Socioeconomic History  . Marital status: Married    Spouse name: Not on file  . Number of children: Not on file  . Years of education: Not on file  . Highest education level: Not on file  Occupational History  . Not on file  Tobacco Use  . Smoking status: Former Smoker    Types: Cigarettes  . Smokeless tobacco: Never Used  Vaping Use  . Vaping Use: Never used  Substance and Sexual Activity  . Alcohol use: Not on file  . Drug use: Not on file   . Sexual activity: Not on file  Other Topics Concern  . Not on file  Social History Narrative  . Not on file   Social Determinants of Health   Financial Resource Strain: Not on file  Food Insecurity: Not on file  Transportation Needs: Not on file  Physical Activity: Not on file  Stress: Not on file  Social Connections: Not on file  Intimate Partner Violence: Not on file    Outpatient Medications Prior to Visit  Medication Sig Dispense Refill  . albuterol (PROVENTIL) (2.5 MG/3ML) 0.083% nebulizer solution Take 2.5 mg by nebulization every 4 (four) hours as needed for wheezing or shortness of breath.    Marland Kitchen albuterol (VENTOLIN HFA) 108 (90 Base) MCG/ACT inhaler Inhale 2 puffs into the lungs every 4 (four) hours as needed for wheezing or shortness of breath. 18 g 5  . ANORO ELLIPTA 62.5-25 MCG/INH AEPB Inhale 1 puff into the lungs daily. 14 each 5  . Continuous Blood Gluc Receiver (FREESTYLE LIBRE 2 READER) DEVI As directed 1 each 0  . Continuous Blood Gluc Sensor (FREESTYLE LIBRE 2 SENSOR) MISC 1 Piece by Does not apply route every 14 (fourteen) days. 2 each 3  . fluconazole (  DIFLUCAN) 150 MG tablet Take 150 mg by mouth as needed.    . fluticasone (FLONASE) 50 MCG/ACT nasal spray Place into both nostrils daily.    . fluticasone-salmeterol (ADVAIR HFA) 115-21 MCG/ACT inhaler Inhale 2 puffs into the lungs 2 (two) times daily as needed (wheezing).     . furosemide (LASIX) 20 MG tablet TAKE 1 TABLET BY MOUTH TWICE A DAY 180 tablet 3  . glucose blood (ACCU-CHEK AVIVA PLUS) test strip TEST BLOOD SUGAR ONE TO TWO TIMES A DAY as directed    . HYDROcodone-acetaminophen (NORCO/VICODIN) 5-325 MG tablet Take one tab po q 4 hrs prn pain 10 tablet 0  . ibuprofen (ADVIL,MOTRIN) 800 MG tablet Take 800 mg by mouth 2 (two) times daily.    . insulin aspart (NOVOLOG) 100 UNIT/ML injection Inject 10-16 Units into the skin 3 (three) times daily before meals. 15 mL 2  . insulin glargine (LANTUS) 100 UNIT/ML  injection Inject 70 Units into the skin at bedtime.    . liraglutide (VICTOZA) 18 MG/3ML SOPN Inject 1.8 mg into the skin daily.     . metFORMIN (GLUCOPHAGE) 1000 MG tablet Take 1,000 mg by mouth 2 (two) times daily with a meal.     . nystatin cream (MYCOSTATIN) Apply 1 application topically 2 (two) times daily.    Marland Kitchen SSD 1 % cream Apply 1 application topically daily. For rash  0  . Vitamin D, Ergocalciferol, (DRISDOL) 1.25 MG (50000 UNIT) CAPS capsule Take 1 capsule (50,000 Units total) by mouth every 7 (seven) days. 12 capsule 1  . amLODipine (NORVASC) 10 MG tablet Take 1 tablet (10 mg total) by mouth daily. 90 tablet 1  . atorvastatin (LIPITOR) 80 MG tablet Take 1 tablet (80 mg total) by mouth daily. 90 tablet 1  . Cholecalciferol (VITAMIN D3) 50 MCG (2000 UT) TABS Take 1 capsule by mouth daily.    Marland Kitchen lisinopril (ZESTRIL) 30 MG tablet Take 1 tablet (30 mg total) by mouth daily. 30 tablet 0  . mirtazapine (REMERON) 30 MG tablet Take 30 mg by mouth at bedtime.    Marland Kitchen omeprazole (PRILOSEC) 40 MG capsule Take 40 mg by mouth daily.     No facility-administered medications prior to visit.    Allergies  Allergen Reactions  . Morphine And Related   . Other Other (See Comments)    Bells Palsy   . Oxycodone-Acetaminophen Other (See Comments)    Constipates patient   . Latex Itching and Other (See Comments)    Skin "cracks" open   . Morphine Itching and Other (See Comments)    ROS Review of Systems  Constitutional: Negative for chills and fever.  HENT: Negative for congestion, sinus pressure, sinus pain and sore throat.   Eyes: Negative for pain and discharge.  Respiratory: Negative for cough, shortness of breath and wheezing.   Cardiovascular: Negative for chest pain and palpitations.  Gastrointestinal: Negative for abdominal pain, constipation, diarrhea, nausea and vomiting.  Endocrine: Negative for polydipsia and polyuria.  Genitourinary: Negative for dysuria and hematuria.   Musculoskeletal: Positive for arthralgias and back pain. Negative for neck pain and neck stiffness.  Skin: Negative for rash.  Neurological: Negative for dizziness and weakness.  Psychiatric/Behavioral: Negative for agitation and behavioral problems.      Objective:    Physical Exam Vitals reviewed.  Constitutional:      General: She is not in acute distress.    Appearance: She is not diaphoretic.  HENT:     Head: Normocephalic and atraumatic.  Nose: Nose normal.     Mouth/Throat:     Mouth: Mucous membranes are moist.  Eyes:     General: No scleral icterus.    Extraocular Movements: Extraocular movements intact.     Pupils: Pupils are equal, round, and reactive to light.  Cardiovascular:     Rate and Rhythm: Normal rate and regular rhythm.     Pulses: Normal pulses.     Heart sounds: Normal heart sounds. No murmur heard.   Pulmonary:     Breath sounds: No wheezing or rales.  Musculoskeletal:     Cervical back: Neck supple. No tenderness.     Right lower leg: No edema.     Left lower leg: No edema.  Skin:    General: Skin is warm.     Findings: No rash.  Neurological:     General: No focal deficit present.     Mental Status: She is alert and oriented to person, place, and time.  Psychiatric:        Mood and Affect: Mood normal.        Behavior: Behavior normal.     BP (!) 156/80 (BP Location: Right Arm, Patient Position: Sitting, Cuff Size: Normal)   Pulse (!) 102   Resp 20   Ht 5' (1.524 m)   Wt 215 lb 1.9 oz (97.6 kg)   SpO2 96%   BMI 42.01 kg/m  Wt Readings from Last 3 Encounters:  01/27/21 215 lb 1.9 oz (97.6 kg)  01/12/21 227 lb (103 kg)  12/23/20 241 lb (109.3 kg)     Health Maintenance Due  Topic Date Due  . Hepatitis C Screening  Never done  . OPHTHALMOLOGY EXAM  Never done  . PAP SMEAR-Modifier  Never done  . COLONOSCOPY (Pts 45-52yrs Insurance coverage will need to be confirmed)  Never done  . MAMMOGRAM  Never done    There are no  preventive care reminders to display for this patient.  Lab Results  Component Value Date   TSH 2.640 11/11/2020   Lab Results  Component Value Date   WBC 11.3 (H) 12/10/2020   HGB 13.3 12/10/2020   HCT 45.1 12/10/2020   MCV 82.1 12/10/2020   PLT 248 12/10/2020   Lab Results  Component Value Date   NA 141 01/12/2021   K 4.2 01/12/2021   CO2 29 01/12/2021   GLUCOSE 176 (H) 01/12/2021   BUN 12 01/12/2021   CREATININE 0.95 01/12/2021   BILITOT 0.3 11/11/2020   ALKPHOS 116 11/11/2020   AST 10 11/11/2020   ALT 14 11/11/2020   PROT 5.9 (L) 11/11/2020   ALBUMIN 3.5 (L) 11/11/2020   CALCIUM 9.0 01/12/2021   ANIONGAP 12 01/12/2021   Lab Results  Component Value Date   CHOL 175 11/11/2020   Lab Results  Component Value Date   HDL 45 11/11/2020   Lab Results  Component Value Date   LDLCALC 102 (H) 11/11/2020   Lab Results  Component Value Date   TRIG 159 (H) 11/11/2020   Lab Results  Component Value Date   CHOLHDL 3.9 11/11/2020   Lab Results  Component Value Date   HGBA1C 9.5 (H) 11/11/2020      Assessment & Plan:   Problem List Items Addressed This Visit      Cardiovascular and Mediastinum   Hypertension - Primary    BP Readings from Last 1 Encounters:  01/27/21 (!) 156/80   Uncontrolled due to noncompliance Continue Amlodipine and Lisinopril for now Counseled  for compliance with the medications Advised DASH diet and moderate exercise/walking, at least 150 mins/week      Relevant Medications   amLODipine (NORVASC) 10 MG tablet   atorvastatin (LIPITOR) 80 MG tablet   lisinopril (ZESTRIL) 30 MG tablet   Other Relevant Orders   TSH + free T4   (HFpEF) heart failure with preserved ejection fraction (HCC)    Appears euvolemic Continue Lasix Follows up with Cardiologist      Relevant Medications   amLODipine (NORVASC) 10 MG tablet   atorvastatin (LIPITOR) 80 MG tablet   lisinopril (ZESTRIL) 30 MG tablet   Other Relevant Orders   TSH + free T4      Respiratory   Chronic obstructive pulmonary disease (HCC)    On Anoro Ellipta and Advair Uses Albuterol nebs and inhaler PRN On home O2 - 2 lpm        Endocrine   Type 2 diabetes mellitus without complication, with long-term current use of insulin (HCC)    Lab Results  Component Value Date   HGBA1C 9.5 (H) 11/11/2020   Takes Lantus 70 U qHS, Novolog 14-20 U TID according to sliding scale Trulicity 1.8 mg QD Metformin 1000 mg BID Follows up with Dr Dorris Fetch On statin and ACEi Referred to Ophthalmology for diabetic eye exam      Relevant Medications   atorvastatin (LIPITOR) 80 MG tablet   lisinopril (ZESTRIL) 30 MG tablet     Other   Hyperlipidemia   Relevant Medications   amLODipine (NORVASC) 10 MG tablet   atorvastatin (LIPITOR) 80 MG tablet   lisinopril (ZESTRIL) 30 MG tablet    Other Visit Diagnoses    Gastroesophageal reflux disease, unspecified whether esophagitis present       Relevant Medications   omeprazole (PRILOSEC) 40 MG capsule   Routine cervical smear       Relevant Orders   Ambulatory referral to Obstetrics / Gynecology   Screening mammogram for breast cancer       Relevant Orders   MM 3D SCREEN BREAST BILATERAL   Encounter for screening for HIV       Relevant Orders   HIV antibody (with reflex)   Need for hepatitis C screening test       Relevant Orders   Hepatitis C Antibody      Meds ordered this encounter  Medications  . amLODipine (NORVASC) 10 MG tablet    Sig: Take 1 tablet (10 mg total) by mouth daily.    Dispense:  90 tablet    Refill:  1  . atorvastatin (LIPITOR) 80 MG tablet    Sig: Take 1 tablet (80 mg total) by mouth daily.    Dispense:  90 tablet    Refill:  1  . lisinopril (ZESTRIL) 30 MG tablet    Sig: Take 1 tablet (30 mg total) by mouth daily.    Dispense:  90 tablet    Refill:  1  . omeprazole (PRILOSEC) 40 MG capsule    Sig: Take 1 capsule (40 mg total) by mouth daily.    Dispense:  90 capsule    Refill:  1     Follow-up: Return in about 4 months (around 05/29/2021) for HTN, COPD and CHF.    Lindell Spar, MD

## 2021-01-27 NOTE — Assessment & Plan Note (Addendum)
BP Readings from Last 1 Encounters:  01/27/21 (!) 156/80   Uncontrolled due to noncompliance Continue Amlodipine and Lisinopril for now Counseled for compliance with the medications Advised DASH diet and moderate exercise/walking, at least 150 mins/week

## 2021-01-29 LAB — HEPATITIS C ANTIBODY: Hep C Virus Ab: <0.1 s/co ratio (ref 0.0–0.9)

## 2021-01-29 LAB — TSH+FREE T4
Free T4: 1.25 ng/dL (ref 0.82–1.77)
TSH: 1.73 u[IU]/mL (ref 0.450–4.500)

## 2021-01-29 LAB — HIV ANTIBODY (ROUTINE TESTING W REFLEX): HIV Screen 4th Generation wRfx: NONREACTIVE

## 2021-02-02 ENCOUNTER — Ambulatory Visit (INDEPENDENT_AMBULATORY_CARE_PROVIDER_SITE_OTHER): Payer: 59 | Admitting: Internal Medicine

## 2021-02-02 ENCOUNTER — Encounter: Payer: Self-pay | Admitting: Internal Medicine

## 2021-02-02 ENCOUNTER — Other Ambulatory Visit: Payer: Self-pay

## 2021-02-02 VITALS — BP 138/85 | HR 111 | Resp 18 | Ht 60.0 in | Wt 211.1 lb

## 2021-02-02 DIAGNOSIS — G510 Bell's palsy: Secondary | ICD-10-CM | POA: Insufficient documentation

## 2021-02-02 MED ORDER — PREDNISONE 20 MG PO TABS: 60.0000 mg | ORAL_TABLET | Freq: Every day | ORAL | 0 refills | Status: DC

## 2021-02-02 NOTE — Progress Notes (Signed)
Acute Office Visit  Subjective:    Patient ID: Christine Weaver, female    DOB: 1966/09/29, 55 y.o.   MRN: [id number]  Chief Complaint  Patient presents with  . Follow-up    3 month follow up feels like bells palsy is acting up on the right side started day before yesterday     HPI Patient is in today for evaluation of right sided facial drooping and mild perioral numbness. She is not able to elevated eyebrow on the right side. She denies any numbness, tingling or weakness of UE or LE. No reports of slurred speech. No change in mental status.  Past Medical History:  Diagnosis Date  . Asthma   . COPD (chronic obstructive pulmonary disease) ([place])   . Hyperlipidemia   . Hypertension   . Type 2 diabetes mellitus ([place])     Past Surgical History:  Procedure Laterality Date  . CESAREAN SECTION      Family History  Problem Relation Age of Onset  . Thyroid disease Mother   . Hypertension Mother   . Heart attack Mother   . Stroke Mother   . Heart failure Mother   . Diabetes Mother   . Diabetes Father   . Heart attack Father   . Stroke Father   . Kidney disease Father   . Heart failure Father     Social History   Socioeconomic History  . Marital status: Married    Spouse name: Not on file  . Number of children: Not on file  . Years of education: Not on file  . Highest education level: Not on file  Occupational History  . Not on file  Tobacco Use  . Smoking status: Former Smoker    Types: Cigarettes  . Smokeless tobacco: Never Used  Vaping Use  . Vaping Use: Never used  Substance and Sexual Activity  . Alcohol use: Not on file  . Drug use: Not on file  . Sexual activity: Not on file  Other Topics Concern  . Not on file  Social History Narrative  . Not on file   Social Determinants of Health   Financial Resource Strain: Not on file  Food Insecurity: Not on file  Transportation Needs: Not on file  Physical Activity: Not on file  Stress: Not on file  Social  Connections: Not on file  Intimate Partner Violence: Not on file    Outpatient Medications Prior to Visit  Medication Sig Dispense Refill  . albuterol (PROVENTIL) (2.5 MG/3ML) 0.083% nebulizer solution Take 2.5 mg by nebulization every 4 (four) hours as needed for wheezing or shortness of breath.    Marland Kitchen albuterol (VENTOLIN HFA) 108 (90 Base) MCG/ACT inhaler Inhale 2 puffs into the lungs every 4 (four) hours as needed for wheezing or shortness of breath. 18 g 5  . amLODipine (NORVASC) 10 MG tablet Take 1 tablet (10 mg total) by mouth daily. 90 tablet 1  . ANORO ELLIPTA 62.5-25 MCG/INH AEPB Inhale 1 puff into the lungs daily. 14 each 5  . atorvastatin (LIPITOR) 80 MG tablet Take 1 tablet (80 mg total) by mouth daily. 90 tablet 1  . Continuous Blood Gluc Receiver (FREESTYLE LIBRE 2 READER) DEVI As directed 1 each 0  . Continuous Blood Gluc Sensor (FREESTYLE LIBRE 2 SENSOR) MISC 1 Piece by Does not apply route every 14 (fourteen) days. 2 each 3  . fluconazole (DIFLUCAN) 150 MG tablet Take 150 mg by mouth as needed.    . fluticasone (FLONASE) 50  MCG/ACT nasal spray Place into both nostrils daily.    . fluticasone-salmeterol (ADVAIR HFA) 115-21 MCG/ACT inhaler Inhale 2 puffs into the lungs 2 (two) times daily as needed (wheezing).     . furosemide (LASIX) 20 MG tablet TAKE 1 TABLET BY MOUTH TWICE A DAY 180 tablet 3  . glucose blood (ACCU-CHEK AVIVA PLUS) test strip TEST BLOOD SUGAR ONE TO TWO TIMES A DAY as directed    . HYDROcodone-acetaminophen (NORCO/VICODIN) 5-325 MG tablet Take one tab po q 4 hrs prn pain 10 tablet 0  . ibuprofen (ADVIL,MOTRIN) 800 MG tablet Take 800 mg by mouth 2 (two) times daily.    . insulin aspart (NOVOLOG) 100 UNIT/ML injection Inject 10-16 Units into the skin 3 (three) times daily before meals. 15 mL 2  . insulin glargine (LANTUS) 100 UNIT/ML injection Inject 70 Units into the skin at bedtime.    . liraglutide (VICTOZA) 18 MG/3ML SOPN Inject 1.8 mg into the skin daily.      Marland Kitchen lisinopril (ZESTRIL) 30 MG tablet Take 1 tablet (30 mg total) by mouth daily. 90 tablet 1  . metFORMIN (GLUCOPHAGE) 1000 MG tablet Take 1,000 mg by mouth 2 (two) times daily with a meal.     . nystatin cream (MYCOSTATIN) Apply 1 application topically 2 (two) times daily.    Marland Kitchen omeprazole (PRILOSEC) 40 MG capsule Take 1 capsule (40 mg total) by mouth daily. 90 capsule 1  . SSD 1 % cream Apply 1 application topically daily. For rash  0  . Vitamin D, Ergocalciferol, (DRISDOL) 1.25 MG (50000 UNIT) CAPS capsule Take 1 capsule (50,000 Units total) by mouth every 7 (seven) days. 12 capsule 1   No facility-administered medications prior to visit.    Allergies  Allergen Reactions  . Morphine And Related   . Other Other (See Comments)    Bells Palsy   . Oxycodone-Acetaminophen Other (See Comments)    Constipates patient   . Latex Itching and Other (See Comments)    Skin "cracks" open   . Morphine Itching and Other (See Comments)    Review of Systems  Constitutional: Negative for chills and fever.  HENT: Negative for congestion, sinus pressure, sinus pain and sore throat.   Respiratory: Positive for shortness of breath (chronic). Negative for cough.   Cardiovascular: Negative for chest pain and palpitations.  Musculoskeletal: Negative for neck pain and neck stiffness.  Skin: Negative for rash.  Neurological: Positive for numbness.       Facial droop       Objective:    Physical Exam Vitals reviewed.  Constitutional:      General: She is not in acute distress.    Appearance: She is not diaphoretic.  HENT:     Head: Normocephalic and atraumatic.     Nose: Nose normal.     Mouth/Throat:     Mouth: Mucous membranes are moist.  Eyes:     General: No scleral icterus.    Extraocular Movements: Extraocular movements intact.  Cardiovascular:     Rate and Rhythm: Normal rate and regular rhythm.     Pulses: Normal pulses.     Heart sounds: Normal heart sounds. No murmur  heard.   Pulmonary:     Breath sounds: Normal breath sounds. No wheezing or rales.  Abdominal:     Palpations: Abdomen is soft.     Tenderness: There is no abdominal tenderness.  Musculoskeletal:     Cervical back: Neck supple. No tenderness.     Right  lower leg: No edema.     Left lower leg: No edema.  Skin:    General: Skin is warm.     Findings: No rash.  Neurological:     General: No focal deficit present.     Mental Status: She is alert and oriented to person, place, and time.     Cranial Nerves: Cranial nerve deficit (7th nerve) present.     Sensory: No sensory deficit.     Motor: No weakness.  Psychiatric:        Mood and Affect: Mood normal.        Behavior: Behavior normal.     BP 138/85 (BP Location: Right Arm, Patient Position: Sitting, Cuff Size: Normal)   Pulse (!) 111   Resp 18   Ht 5' (1.524 m)   Wt 211 lb 1.9 oz (95.8 kg)   SpO2 94%   BMI 41.23 kg/m  Wt Readings from Last 3 Encounters:  02/02/21 211 lb 1.9 oz (95.8 kg)  01/27/21 215 lb 1.9 oz (97.6 kg)  01/12/21 227 lb (103 kg)    Health Maintenance Due  Topic Date Due  . OPHTHALMOLOGY EXAM  Never done  . PAP SMEAR-Modifier  Never done  . COLONOSCOPY (Pts 45-13yrs Insurance coverage will need to be confirmed)  Never done  . MAMMOGRAM  Never done    There are no preventive care reminders to display for this patient.   Lab Results  Component Value Date   TSH 1.730 01/27/2021   Lab Results  Component Value Date   WBC 11.3 (H) 12/10/2020   HGB 13.3 12/10/2020   HCT 45.1 12/10/2020   MCV 82.1 12/10/2020   PLT 248 12/10/2020   Lab Results  Component Value Date   NA 141 01/12/2021   K 4.2 01/12/2021   CO2 29 01/12/2021   GLUCOSE 176 (H) 01/12/2021   BUN 12 01/12/2021   CREATININE 0.95 01/12/2021   BILITOT 0.3 11/11/2020   ALKPHOS 116 11/11/2020   AST 10 11/11/2020   ALT 14 11/11/2020   PROT 5.9 (L) 11/11/2020   ALBUMIN 3.5 (L) 11/11/2020   CALCIUM 9.0 01/12/2021   ANIONGAP 12  01/12/2021   Lab Results  Component Value Date   CHOL 175 11/11/2020   Lab Results  Component Value Date   HDL 45 11/11/2020   Lab Results  Component Value Date   LDLCALC 102 (H) 11/11/2020   Lab Results  Component Value Date   TRIG 159 (H) 11/11/2020   Lab Results  Component Value Date   CHOLHDL 3.9 11/11/2020   Lab Results  Component Value Date   HGBA1C 9.5 (H) 11/11/2020       Assessment & Plan:   Problem List Items Addressed This Visit      Nervous and Auditory   Bell's palsy - Primary Appears to be Bell's palsy from physical exam Unable to elevate eyebrow, no other neurologic deficit - unlikely to be stroke Start Prednisone for 7 days   Relevant Medications   predniSONE (DELTASONE) 20 MG tablet       Meds ordered this encounter  Medications  . predniSONE (DELTASONE) 20 MG tablet    Sig: Take 3 tablets (60 mg total) by mouth daily with breakfast for 7 days.    Dispense:  21 tablet    Refill:  0     Daron Stutz Keith Rake, MD

## 2021-02-02 NOTE — Patient Instructions (Signed)
Bell's Palsy, Adult  Bell's palsy is a short-term inability to move muscles in a part of the face. The inability to move, also called paralysis, results from inflammation or compression of the seventh cranial nerve. This nerve travels along the skull and under the ear to the side of the face. This nerve is responsible for facial movements that include blinking, closing the eyes, smiling, and frowning. What are the causes? The exact cause of this condition is not known. It may be caused by an infection from a virus, such as the chickenpox (herpes zoster), Epstein-Barr, or mumps virus. What increases the risk? You are more likely to develop this condition if:  You are pregnant.  You have diabetes.  You have had a recent infection in your nose, throat, or airways.  You have a weakened body defense system (immune system).  You have had a facial injury, such as a fracture.  You have a family history of Bell's palsy. What are the signs or symptoms? Symptoms of this condition include:  Weakness on one side of the face.  Drooping eyelid and corner of the mouth.  Excessive tearing in one eye.  Difficulty closing the eyelid.  Dry eye.  Drooling.  Dry mouth.  Changes in taste.  Change in facial appearance.  Pain behind one ear.  Ringing in one or both ears.  Sensitivity to sound in one ear.  Facial twitching.  Headache.  Impaired speech.  Dizziness.  Difficulty eating or drinking. Most of the time, only one side of the face is affected. In rare cases, Bell's palsy may affect the whole face. How is this diagnosed? This condition is diagnosed based on:  Your symptoms.  Your medical history.  A physical exam. You may also have to see health care providers who specialize in disorders of the nerves (neurologist) or diseases and conditions of the eye (ophthalmologist). You may have tests, such as:  A test to check for nerve damage (electromyogram).  Imaging studies,  such as a CT scan or an MRI.  Blood tests. How is this treated? This condition affects every person differently. Sometimes symptoms go away without treatment within a couple weeks. If treatment is needed, it varies from person to person. The goal of treatment is to reduce inflammation and protect the eye from damage. Treatment for Bell's palsy may include:  Medicines, such as: ? Steroids to reduce swelling and inflammation. ? Antiviral medicines. ? Pain relievers, including aspirin, acetaminophen, or ibuprofen.  Eye drops or ointment to keep your eye moist.  Eye protection, if you cannot close your eye.  Exercises or massage to regain muscle strength and function (physical therapy). Follow these instructions at home:  Take over-the-counter and prescription medicines only as told by your health care provider.  If your eye is affected: ? Keep your eye moist with eye drops or ointment as told by your health care provider. ? Follow instructions for eye care and protection as told by your health care provider.  Do any physical therapy exercises as told by your health care provider.  Keep all follow-up visits. This is important.   Contact a health care provider if:  You have a fever or chills.  Your symptoms do not get better within 2-3 weeks, or your symptoms get worse.  Your eye is red, irritated, or painful.  You have new symptoms. Get help right away if:  You have weakness or numbness in a part of your body other than your face.  You have  trouble swallowing.  You develop neck pain or stiffness.  You develop dizziness or shortness of breath. Summary  Bell's palsy is a short-term inability to move muscles in a part of the face. The inability to move results from inflammation or compression of the facial nerve.  This condition affects every person differently. Sometimes symptoms go away without treatment within a couple weeks.  If treatment is needed, it varies from  person to person. The goal of treatment is to reduce inflammation and protect the eye from damage.  Contact your health care provider if your symptoms do not get better within 2-3 weeks, or your symptoms get worse. This information is not intended to replace advice given to you by your health care provider. Make sure you discuss any questions you have with your health care provider. Document Revised: 07/15/2020 Document Reviewed: 07/15/2020 Elsevier Patient Education  2021 [employer].

## 2021-02-05 ENCOUNTER — Other Ambulatory Visit: Payer: Self-pay | Admitting: Internal Medicine

## 2021-02-05 DIAGNOSIS — E559 Vitamin D deficiency, unspecified: Secondary | ICD-10-CM

## 2021-02-07 ENCOUNTER — Telehealth: Payer: Self-pay

## 2021-02-07 DIAGNOSIS — E119 Type 2 diabetes mellitus without complications: Secondary | ICD-10-CM

## 2021-02-07 MED ORDER — INSULIN ASPART 100 UNIT/ML ~~LOC~~ SOLN: 15.0000 [IU] | Freq: Three times a day (TID) | SUBCUTANEOUS | 0 refills | Status: DC

## 2021-02-07 NOTE — Telephone Encounter (Signed)
Discussed with pt, understanding voiced. Rx for novolog sent to [facility].

## 2021-02-07 NOTE — Telephone Encounter (Signed)
Pt said she needs a refill on her insulin aspart (NOVOLOG) 100 UNIT/ML injection.   Dr Posey Pronto has her on prednisone- she has been drinking water.  4/8 morning 187, before lunch 190, before dinner 315, bedtime 330 4/9 morning 174, before lunch 309, before dinner 370, bedtime 360 4/10 morning 274, before lunch 190, before dinner 341, bedtime 368 4/11 morning 125 (she did not take her prednisone last night before bed)

## 2021-02-07 NOTE — Telephone Encounter (Signed)
Advise to increase Lantus to 80 units nightly, increase NovoLog to 15 units  3 times a day with meals  plus correction. This is for the duration of the steroids.

## 2021-02-07 NOTE — Telephone Encounter (Signed)
Patient called she states the steriods given last week arent helping and is requesting a anti viral medication ph# [phone]

## 2021-02-07 NOTE — Telephone Encounter (Signed)
Please make pt a virtual/phone appt

## 2021-02-08 ENCOUNTER — Encounter: Payer: Self-pay | Admitting: Internal Medicine

## 2021-02-08 ENCOUNTER — Telehealth (INDEPENDENT_AMBULATORY_CARE_PROVIDER_SITE_OTHER): Payer: 59 | Admitting: Internal Medicine

## 2021-02-08 ENCOUNTER — Other Ambulatory Visit: Payer: Self-pay

## 2021-02-08 ENCOUNTER — Ambulatory Visit: Payer: Medicare Other | Admitting: Internal Medicine

## 2021-02-08 DIAGNOSIS — G510 Bell's palsy: Secondary | ICD-10-CM

## 2021-02-08 MED ORDER — HYPROMELLOSE (GONIOSCOPIC) 2.5 % OP SOLN: 1.0000 [drp] | Freq: Three times a day (TID) | OPHTHALMIC | 1 refills | Status: DC

## 2021-02-08 MED ORDER — PREDNISONE 10 MG (21) PO TBPK: ORAL_TABLET | ORAL | 0 refills | Status: DC

## 2021-02-08 MED ORDER — VALACYCLOVIR HCL 1 G PO TABS
1000.0000 mg | ORAL_TABLET | Freq: Three times a day (TID) | ORAL | 0 refills | Status: DC
Start: 2021-02-08 — End: 2021-06-02

## 2021-02-08 NOTE — Patient Instructions (Signed)
Please start taking Prednisone as prescribed.  Please start taking Valacyclovir as prescribed.  Please use goggles during the daytime. Apply artificial tears for eye protection.

## 2021-02-08 NOTE — Progress Notes (Signed)
Virtual Visit via Telephone Note   This visit type was conducted due to national recommendations for restrictions regarding the COVID-19 Pandemic (e.g. social distancing) in an effort to limit this patient's exposure and mitigate transmission in our community.  Due to her co-morbid illnesses, this patient is at least at moderate risk for complications without adequate follow up.  This format is felt to be most appropriate for this patient at this time.  The patient did not have access to video technology/had technical difficulties with video requiring transitioning to audio format only (telephone).  All issues noted in this document were discussed and addressed.  No physical exam could be performed with this format.  Please refer to the patient's chart for her  consent to telehealth for [facility].   Evaluation Performed:  Follow-up visit  Date:  02/08/2021   ID:  Christine Weaver, DOB June 04, 1966, MRN [id number]  Patient Location: Home Provider Location: Office/Clinic  Location of Patient: Home Location of Provider: Telehealth Consent was obtain for visit to be over via telehealth. I verified that I am speaking with the correct person using two identifiers.  PCP:  Lindell Spar, MD   Chief Complaint:  Bell's palsy  History of Present Illness:    Christine Weaver is a 55 y.o. female who has a televisit for c/o right sided facial drooping and mild perioral numbness. She was started on Prednisone in the last visit, which has not improved her symptoms completely. She is unable to close her right eye completely. Denies any burning pain in the eye. No reports of slurred speech. She denies any numbness, tingling or weakness of UE or LE.  The patient does not have symptoms concerning for COVID-19 infection (fever, chills, cough, or new shortness of breath).   Past Medical, Surgical, Social History, Allergies, and Medications have been Reviewed.  Past Medical History:  Diagnosis Date  .  Asthma   . COPD (chronic obstructive pulmonary disease) ([place])   . Hyperlipidemia   . Hypertension   . Type 2 diabetes mellitus ([place])    Past Surgical History:  Procedure Laterality Date  . CESAREAN SECTION       Current Meds  Medication Sig  . albuterol (PROVENTIL) (2.5 MG/3ML) 0.083% nebulizer solution Take 2.5 mg by nebulization every 4 (four) hours as needed for wheezing or shortness of breath.  Marland Kitchen albuterol (VENTOLIN HFA) 108 (90 Base) MCG/ACT inhaler Inhale 2 puffs into the lungs every 4 (four) hours as needed for wheezing or shortness of breath.  Marland Kitchen amLODipine (NORVASC) 10 MG tablet Take 1 tablet (10 mg total) by mouth daily.  Jearl Klinefelter ELLIPTA 62.5-25 MCG/INH AEPB Inhale 1 puff into the lungs daily.  Marland Kitchen atorvastatin (LIPITOR) 80 MG tablet Take 1 tablet (80 mg total) by mouth daily.  . Continuous Blood Gluc Receiver (FREESTYLE LIBRE 2 READER) DEVI As directed  . Continuous Blood Gluc Sensor (FREESTYLE LIBRE 2 SENSOR) MISC 1 Piece by Does not apply route every 14 (fourteen) days.  . fluconazole (DIFLUCAN) 150 MG tablet Take 150 mg by mouth as needed.  . fluticasone (FLONASE) 50 MCG/ACT nasal spray Place into both nostrils daily.  . fluticasone-salmeterol (ADVAIR HFA) 115-21 MCG/ACT inhaler Inhale 2 puffs into the lungs 2 (two) times daily as needed (wheezing).   . furosemide (LASIX) 20 MG tablet TAKE 1 TABLET BY MOUTH TWICE A DAY  . glucose blood (ACCU-CHEK AVIVA PLUS) test strip TEST BLOOD SUGAR ONE TO TWO TIMES A DAY as directed  .  HYDROcodone-acetaminophen (NORCO/VICODIN) 5-325 MG tablet Take one tab po q 4 hrs prn pain  . hydroxypropyl methylcellulose / hypromellose (ISOPTO TEARS / GONIOVISC) 2.5 % ophthalmic solution Place 1 drop into both eyes 3 (three) times daily.  Marland Kitchen ibuprofen (ADVIL,MOTRIN) 800 MG tablet Take 800 mg by mouth 2 (two) times daily.  . insulin aspart (NOVOLOG) 100 UNIT/ML injection Inject 15 Units into the skin 3 (three) times daily before meals.  . insulin glargine  (LANTUS) 100 UNIT/ML injection Inject 70 Units into the skin at bedtime.  . liraglutide (VICTOZA) 18 MG/3ML SOPN Inject 1.8 mg into the skin daily.   Marland Kitchen lisinopril (ZESTRIL) 30 MG tablet Take 1 tablet (30 mg total) by mouth daily.  . metFORMIN (GLUCOPHAGE) 1000 MG tablet Take 1,000 mg by mouth 2 (two) times daily with a meal.   . nystatin cream (MYCOSTATIN) Apply 1 application topically 2 (two) times daily.  Marland Kitchen omeprazole (PRILOSEC) 40 MG capsule Take 1 capsule (40 mg total) by mouth daily.  . predniSONE (STERAPRED UNI-PAK 21 TAB) 10 MG (21) TBPK tablet Take as package instructions.  . SSD 1 % cream Apply 1 application topically daily. For rash  . valACYclovir (VALTREX) 1000 MG tablet Take 1 tablet (1,000 mg total) by mouth 3 (three) times daily.  . Vitamin D, Ergocalciferol, (DRISDOL) 1.25 MG (50000 UNIT) CAPS capsule Take 1 capsule (50,000 Units total) by mouth every 7 (seven) days.  . [DISCONTINUED] predniSONE (DELTASONE) 20 MG tablet Take 3 tablets (60 mg total) by mouth daily with breakfast for 7 days.     Allergies:   Morphine and related, Other, Oxycodone-acetaminophen, Latex, and Morphine   ROS:   Please see the history of present illness.     All other systems reviewed and are negative.   Labs/Other Tests and Data Reviewed:    Recent Labs: 11/11/2020: ALT 14 12/09/2020: Magnesium 1.8 12/10/2020: Hemoglobin 13.3; Platelets 248 01/12/2021: B Natriuretic Peptide 27.0; BUN 12; Creatinine, Ser 0.95; Potassium 4.2; Sodium 141 01/27/2021: TSH 1.730   Recent Lipid Panel Lab Results  Component Value Date/Time   CHOL 175 11/11/2020 08:17 AM   TRIG 159 (H) 11/11/2020 08:17 AM   HDL 45 11/11/2020 08:17 AM   CHOLHDL 3.9 11/11/2020 08:17 AM   LDLCALC 102 (H) 11/11/2020 08:17 AM    Wt Readings from Last 3 Encounters:  02/02/21 211 lb 1.9 oz (95.8 kg)  01/27/21 215 lb 1.9 oz (97.6 kg)  01/12/21 227 lb (103 kg)      ASSESSMENT & PLAN:    Bell's palsy Steroid taper - Sterapred  dosepak Added Valacyclovir Aritificial tears for eye protection Advised to wear goggles during the daytime Contact if symptoms persistent  Time:   Today, I have spent 12 minutes reviewing the chart, including problem list, medications, and with the patient with telehealth technology discussing the above problems.   Medication Adjustments/Labs and Tests Ordered: Current medicines are reviewed at length with the patient today.  Concerns regarding medicines are outlined above.   Tests Ordered: No orders of the defined types were placed in this encounter.   Medication Changes: Meds ordered this encounter  Medications  . predniSONE (STERAPRED UNI-PAK 21 TAB) 10 MG (21) TBPK tablet    Sig: Take as package instructions.    Dispense:  1 each    Refill:  0  . valACYclovir (VALTREX) 1000 MG tablet    Sig: Take 1 tablet (1,000 mg total) by mouth 3 (three) times daily.    Dispense:  21 tablet  Refill:  0  . hydroxypropyl methylcellulose / hypromellose (ISOPTO TEARS / GONIOVISC) 2.5 % ophthalmic solution    Sig: Place 1 drop into both eyes 3 (three) times daily.    Dispense:  15 mL    Refill:  1     Note: This dictation was prepared with Dragon dictation along with smaller phrase technology. Similar sounding words can be transcribed inadequately or may not be corrected upon review. Any transcriptional errors that result from this process are unintentional.      Disposition:  Follow up  Signed, Lindell Spar, MD  02/08/2021 8:26 AM     [facility]

## 2021-02-24 ENCOUNTER — Ambulatory Visit (INDEPENDENT_AMBULATORY_CARE_PROVIDER_SITE_OTHER): Payer: 59 | Admitting: "Endocrinology

## 2021-02-24 ENCOUNTER — Other Ambulatory Visit: Payer: Self-pay

## 2021-02-24 ENCOUNTER — Encounter: Payer: Self-pay | Admitting: "Endocrinology

## 2021-02-24 VITALS — BP 108/62 | HR 96 | Ht 60.0 in | Wt 215.2 lb

## 2021-02-24 DIAGNOSIS — I10 Essential (primary) hypertension: Secondary | ICD-10-CM

## 2021-02-24 DIAGNOSIS — E782 Mixed hyperlipidemia: Secondary | ICD-10-CM | POA: Diagnosis not present

## 2021-02-24 DIAGNOSIS — Z794 Long term (current) use of insulin: Secondary | ICD-10-CM

## 2021-02-24 DIAGNOSIS — E119 Type 2 diabetes mellitus without complications: Secondary | ICD-10-CM | POA: Diagnosis not present

## 2021-02-24 LAB — POCT GLYCOSYLATED HEMOGLOBIN (HGB A1C): HbA1c, POC (controlled diabetic range): 9 % — AB (ref 0.0–7.0)

## 2021-02-24 NOTE — Patient Instructions (Signed)

## 2021-02-24 NOTE — Progress Notes (Signed)
02/24/2021, 4:46 PM  Endocrinology follow-up note   Subjective:    Patient ID: Christine Weaver, female    DOB: Nov 13, 1965.  Christine Weaver is being seen in follow-up after she was seen in consultation for management of currently uncontrolled symptomatic diabetes requested by  Lindell Spar, MD.   Past Medical History:  Diagnosis Date  . Asthma   . COPD (chronic obstructive pulmonary disease) ([place])   . Hyperlipidemia   . Hypertension   . Type 2 diabetes mellitus ([place])     Past Surgical History:  Procedure Laterality Date  . CESAREAN SECTION      Social History   Socioeconomic History  . Marital status: Married    Spouse name: Not on file  . Number of children: Not on file  . Years of education: Not on file  . Highest education level: Not on file  Occupational History  . Not on file  Tobacco Use  . Smoking status: Former Smoker    Types: Cigarettes  . Smokeless tobacco: Never Used  Vaping Use  . Vaping Use: Never used  Substance and Sexual Activity  . Alcohol use: Not on file  . Drug use: Not on file  . Sexual activity: Not on file  Other Topics Concern  . Not on file  Social History Narrative  . Not on file   Social Determinants of Health   Financial Resource Strain: Not on file  Food Insecurity: Not on file  Transportation Needs: Not on file  Physical Activity: Not on file  Stress: Not on file  Social Connections: Not on file    Family History  Problem Relation Age of Onset  . Thyroid disease Mother   . Hypertension Mother   . Heart attack Mother   . Stroke Mother   . Heart failure Mother   . Diabetes Mother   . Diabetes Father   . Heart attack Father   . Stroke Father   . Kidney disease Father   . Heart failure Father     Outpatient Encounter Medications as of 02/24/2021  Medication Sig  . albuterol (PROVENTIL) (2.5 MG/3ML) 0.083% nebulizer solution Take 2.5 mg by nebulization every 4 (four) hours as needed for  wheezing or shortness of breath.  Marland Kitchen albuterol (VENTOLIN HFA) 108 (90 Base) MCG/ACT inhaler Inhale 2 puffs into the lungs every 4 (four) hours as needed for wheezing or shortness of breath.  Marland Kitchen amLODipine (NORVASC) 10 MG tablet Take 1 tablet (10 mg total) by mouth daily.  Christine Weaver ELLIPTA 62.5-25 MCG/INH AEPB Inhale 1 puff into the lungs daily.  Marland Kitchen atorvastatin (LIPITOR) 80 MG tablet Take 1 tablet (80 mg total) by mouth daily.  . Continuous Blood Gluc Receiver (FREESTYLE LIBRE 2 READER) DEVI As directed  . Continuous Blood Gluc Sensor (FREESTYLE LIBRE 2 SENSOR) MISC 1 Piece by Does not apply route every 14 (fourteen) days.  . fluconazole (DIFLUCAN) 150 MG tablet Take 150 mg by mouth as needed.  . fluticasone (FLONASE) 50 MCG/ACT nasal spray Place into both nostrils daily.  . fluticasone-salmeterol (ADVAIR HFA) 115-21 MCG/ACT inhaler Inhale 2 puffs into the lungs 2 (two) times daily as needed (wheezing).   . furosemide (LASIX) 20 MG tablet TAKE 1 TABLET BY MOUTH TWICE A DAY  . glucose blood (ACCU-CHEK AVIVA PLUS) test strip TEST BLOOD SUGAR ONE TO TWO TIMES A DAY as directed  . HYDROcodone-acetaminophen (NORCO/VICODIN) 5-325 MG tablet Take one tab po q 4 hrs prn  pain  . hydroxypropyl methylcellulose / hypromellose (ISOPTO TEARS / GONIOVISC) 2.5 % ophthalmic solution Place 1 drop into both eyes 3 (three) times daily.  Marland Kitchen ibuprofen (ADVIL,MOTRIN) 800 MG tablet Take 800 mg by mouth 2 (two) times daily.  . insulin aspart (NOVOLOG) 100 UNIT/ML injection Inject 15 Units into the skin 3 (three) times daily before meals.  . insulin glargine (LANTUS) 100 UNIT/ML injection Inject 70 Units into the skin at bedtime.  . liraglutide (VICTOZA) 18 MG/3ML SOPN Inject 1.8 mg into the skin daily.   Marland Kitchen lisinopril (ZESTRIL) 30 MG tablet Take 1 tablet (30 mg total) by mouth daily.  . metFORMIN (GLUCOPHAGE) 1000 MG tablet Take 1,000 mg by mouth 2 (two) times daily with a meal.   . nystatin cream (MYCOSTATIN) Apply 1  application topically 2 (two) times daily.  Marland Kitchen omeprazole (PRILOSEC) 40 MG capsule Take 1 capsule (40 mg total) by mouth daily.  . predniSONE (STERAPRED UNI-PAK 21 TAB) 10 MG (21) TBPK tablet Take as package instructions. (Patient not taking: Reported on 02/24/2021)  . SSD 1 % cream Apply 1 application topically daily. For rash  . valACYclovir (VALTREX) 1000 MG tablet Take 1 tablet (1,000 mg total) by mouth 3 (three) times daily. (Patient not taking: Reported on 02/24/2021)  . Vitamin D, Ergocalciferol, (DRISDOL) 1.25 MG (50000 UNIT) CAPS capsule Take 1 capsule (50,000 Units total) by mouth every 7 (seven) days.   No facility-administered encounter medications on file as of 02/24/2021.    ALLERGIES: Allergies  Allergen Reactions  . Morphine And Related   . Other Other (See Comments)    Bells Palsy   . Oxycodone-Acetaminophen Other (See Comments)    Constipates patient   . Latex Itching and Other (See Comments)    Skin "cracks" open   . Morphine Itching and Other (See Comments)    VACCINATION STATUS: Immunization History  Administered Date(s) Administered  . Moderna Sars-Covid-2 Vaccination 12/11/2019, 11/13/2020  . Tdap 03/13/2018    Diabetes She presents for her follow-up diabetic visit. She has type 2 diabetes mellitus. Onset time: She was diagnosed at approximately age of 52 years. Her disease course has been worsening. There are no hypoglycemic associated symptoms. Pertinent negatives for hypoglycemia include no confusion, headaches, pallor or seizures. Associated symptoms include fatigue, polydipsia and polyuria. Pertinent negatives for diabetes include no chest pain and no polyphagia. There are no hypoglycemic complications. Symptoms are worsening. Risk factors for coronary artery disease include dyslipidemia, diabetes mellitus, hypertension, obesity, sedentary lifestyle and tobacco exposure. Current diabetic treatment includes insulin injections. Her weight is fluctuating  minimally. She is following a generally unhealthy diet. When asked about meal planning, she reported none. She has not had a previous visit with a dietitian. She never participates in exercise. (She presents without her meter nor logs.  Her A1c is slightly improved at 9%.  She denies hypoglycemia.    ) An ACE inhibitor/angiotensin II receptor blocker is being taken. Eye exam is current.  Hyperlipidemia This is a chronic problem. The problem is uncontrolled. Recent lipid tests were reviewed and are high. Exacerbating diseases include diabetes and obesity. Pertinent negatives include no chest pain, myalgias or shortness of breath. Current antihyperlipidemic treatment includes statins. Risk factors for coronary artery disease include dyslipidemia, diabetes mellitus, family history, hypertension, obesity and a sedentary lifestyle.  Hypertension This is a chronic problem. The current episode started more than 1 year ago. The problem is uncontrolled. Pertinent negatives include no chest pain, headaches, palpitations or shortness of breath. Risk  factors for coronary artery disease include diabetes mellitus, dyslipidemia, obesity, sedentary lifestyle, smoking/tobacco exposure and family history. Past treatments include ACE inhibitors.   Review of systems: Limited as above.    Objective:    Vitals with BMI 02/24/2021 02/02/2021 01/27/2021  Height 5\' 0"  5\' 0"  5\' 0"   Weight 215 lbs 3 oz 211 lbs 2 oz 215 lbs 2 oz  BMI 42.03 89.21 19.41  Systolic 740 814 481  Diastolic 62 85 80  Pulse 96 111 102    BP 108/62   Pulse 96   Ht 5' (1.524 m)   Wt 215 lb 3.2 oz (97.6 kg)   BMI 42.03 kg/m   Wt Readings from Last 3 Encounters:  02/24/21 215 lb 3.2 oz (97.6 kg)  02/02/21 211 lb 1.9 oz (95.8 kg)  01/27/21 215 lb 1.9 oz (97.6 kg)     CMP ( most recent) CMP     Component Value Date/Time   NA 141 01/12/2021 1504   NA 140 11/11/2020 0817   K 4.2 01/12/2021 1504   CL 100 01/12/2021 1504   CO2 29 01/12/2021  1504   GLUCOSE 176 (H) 01/12/2021 1504   BUN 12 01/12/2021 1504   BUN 13 11/11/2020 0817   CREATININE 0.95 01/12/2021 1504   CALCIUM 9.0 01/12/2021 1504   PROT 5.9 (L) 11/11/2020 0817   ALBUMIN 3.5 (L) 11/11/2020 0817   AST 10 11/11/2020 0817   ALT 14 11/11/2020 0817   ALKPHOS 116 11/11/2020 0817   BILITOT 0.3 11/11/2020 0817   GFRNONAA >60 01/12/2021 1504   GFRAA 80 11/11/2020 0817    Diabetic Labs (most recent): Lab Results  Component Value Date   HGBA1C 9.0 (A) 02/24/2021   HGBA1C 9.5 (H) 11/11/2020   HGBA1C 9.3 04/22/2020     Lipid Panel ( most recent) Lipid Panel     Component Value Date/Time   CHOL 175 11/11/2020 0817   TRIG 159 (H) 11/11/2020 0817   HDL 45 11/11/2020 0817   CHOLHDL 3.9 11/11/2020 0817   LDLCALC 102 (H) 11/11/2020 0817   LABVLDL 28 11/11/2020 0817      Lab Results  Component Value Date   TSH 1.730 01/27/2021   TSH 2.640 11/11/2020   TSH 1.42 09/23/2019   FREET4 1.25 01/27/2021   FREET4 1.28 11/11/2020      Assessment & Plan:   1. Type 2 diabetes mellitus with hyperglycemia, with long-term current use of insulin (HCC)  - Christine Weaver has currently uncontrolled symptomatic type 2 DM since  55 years of age.  She presents without her meter nor logs.  Her A1c is slightly improved at 9%.  She denies hypoglycemia.    Recent labs reviewed.  - I had a long discussion with her about the progressive nature of diabetes and the pathology behind its complications. -her diabetes is complicated by obesity/sedentary life, smoking and she remains at a high risk for more acute and chronic complications which include CAD, CVA, CKD, retinopathy, and neuropathy. These are all discussed in detail with her.  - I have counseled her on diet  and weight management  by adopting a carbohydrate restricted/protein rich diet. Patient is encouraged to switch to  unprocessed or minimally processed     complex starch and increased protein intake (animal or plant  source), fruits, and vegetables. -  she is advised to stick to a routine mealtimes to eat 3 meals  a day and avoid unnecessary snacks ( to snack only to correct hypoglycemia).   -  she acknowledges that there is a room for improvement in her food and drink choices. - Suggestion is made for her to avoid simple carbohydrates  from her diet including Cakes, Sweet Desserts, Ice Cream, Soda (diet and regular), Sweet Tea, Candies, Chips, Cookies, Store Bought Juices, Alcohol in Excess of  1-2 drinks a day, Artificial Sweeteners,  Coffee Creamer, and "Sugar-free" Products, Lemonade. This will help patient to have more stable blood glucose profile and potentially avoid unintended weight gain.   - she will be scheduled with Christine Weaver, RDN, CDE for diabetes education.  - I have approached her with the following individualized plan to manage  her diabetes and patient agrees:   -She presents with her new CGM package.  She will be helped with the first application.    She will continue to need intensive treatment with basal/bolus insulin in order for her to achieve control of diabetes to target.    -Lack of her documented evidence of insulin administration makes it difficult to optimize her treatment.  She is advised to continue  Lantus  70 units nightly, continue NovoLog  10 units  3 times a day with meals  for pre-meal BG readings of 90-150mg /dl, plus patient specific correction dose for unexpected hyperglycemia above 150mg /dl, associated with strict monitoring of glucose 4 times a day-before meals and at bedtime. -He is advised to wear her CGM at all times.   - she is warned not to take insulin without proper monitoring per orders. - Adjustment parameters are given to her for hypo and hyperglycemia in writing. - she is encouraged to call clinic for blood glucose levels less than 70 or above 200 mg /dl. - she is advised to continue Forman 1000 mg p.o. twice daily,  Victoza 1.8 mg subcutaneously daily,   therapeutically suitable for patient .  - Specific targets for  A1c;  LDL, HDL,  and Triglycerides were discussed with the patient.  2) Blood Pressure /Hypertension:   -Her blood pressure is controlled to target.   she is advised to continue her current medications including lisinopril 20 mg p.o. daily with breakfast .  3) Lipids/Hyperlipidemia:   Review of her recent lipid panel showed un controlled  LDL at 102.  She is advised to continue atorvastatin 80 mg p.o. daily at bedtime.      Side effects and precautions discussed with her.  4)  Weight/Diet: Her BMI is 42  -  clearly complicating her diabetes care.   she is  a candidate for weight loss. I discussed with her the fact that loss of 5 - 10% of her  current body weight will have the most impact on her diabetes management.  Exercise, and detailed carbohydrates information provided  -  detailed on discharge instructions.  5) Chronic Care/Health Maintenance:  -she  is on ACEI/ARB and Statin medications and  is encouraged to initiate and continue to follow up with Ophthalmology, Dentist,  Podiatrist at least yearly or according to recommendations, and advised to  quit smoking. I have recommended yearly flu vaccine and pneumonia vaccine at least every 5 years; moderate intensity exercise for up to 150 minutes weekly; and  sleep for at least 7 hours a day.  Her recent screening ABI was negative for PAD in February 2022.  Her next study will be due in February 2027 or sooner if needed.   - she is  advised to maintain close follow up with Lindell Spar, MD for primary care needs, as well as  her other providers for optimal and coordinated care.   I spent 44 minutes in the care of the patient today including review of labs from [place], Lipids, Thyroid Function, Hematology (current and previous including abstractions from other facilities); face-to-face time discussing  her blood glucose readings/logs, discussing hypoglycemia and hyperglycemia  episodes and symptoms, medications doses, her options of short and long term treatment based on the latest standards of care / guidelines;  discussion about incorporating lifestyle medicine;  and documenting the encounter.    Please refer to Patient Instructions for Blood Glucose Monitoring and Insulin/Medications Dosing Guide"  in media tab for additional information. Please  also refer to " Patient Self Inventory" in the Media  tab for reviewed elements of pertinent patient history.  Christine Weaver participated in the discussions, expressed understanding, and voiced agreement with the above plans.  All questions were answered to her satisfaction. she is encouraged to contact clinic should she have any questions or concerns prior to her return visit.    Follow up plan: - Return in about 4 months (around 06/26/2021) for Bring Meter and Logs- A1c in Office.  Christine Lloyd, MD [facility] [address] [place], [place] [address] Phone: [phone]  Fax: [phone]    02/24/2021, 4:46 PM  This note was partially dictated with voice recognition software. Similar sounding words can be transcribed inadequately or may not  be corrected upon review.

## 2021-02-27 ENCOUNTER — Other Ambulatory Visit: Payer: Self-pay | Admitting: Internal Medicine

## 2021-02-27 DIAGNOSIS — E119 Type 2 diabetes mellitus without complications: Secondary | ICD-10-CM

## 2021-02-27 DIAGNOSIS — Z794 Long term (current) use of insulin: Secondary | ICD-10-CM

## 2021-02-28 ENCOUNTER — Ambulatory Visit (INDEPENDENT_AMBULATORY_CARE_PROVIDER_SITE_OTHER): Payer: Medicare Other | Admitting: Gastroenterology

## 2021-03-15 ENCOUNTER — Encounter: Payer: 59 | Admitting: Obstetrics & Gynecology

## 2021-03-21 ENCOUNTER — Other Ambulatory Visit: Payer: Self-pay | Admitting: Internal Medicine

## 2021-03-21 DIAGNOSIS — Z794 Long term (current) use of insulin: Secondary | ICD-10-CM

## 2021-03-21 DIAGNOSIS — E119 Type 2 diabetes mellitus without complications: Secondary | ICD-10-CM

## 2021-03-22 ENCOUNTER — Other Ambulatory Visit: Payer: Self-pay | Admitting: Internal Medicine

## 2021-03-22 DIAGNOSIS — E119 Type 2 diabetes mellitus without complications: Secondary | ICD-10-CM

## 2021-03-22 DIAGNOSIS — Z794 Long term (current) use of insulin: Secondary | ICD-10-CM

## 2021-04-20 ENCOUNTER — Other Ambulatory Visit: Payer: Self-pay | Admitting: Internal Medicine

## 2021-04-20 DIAGNOSIS — E559 Vitamin D deficiency, unspecified: Secondary | ICD-10-CM

## 2021-04-24 ENCOUNTER — Other Ambulatory Visit: Payer: Self-pay | Admitting: Internal Medicine

## 2021-04-24 DIAGNOSIS — Z794 Long term (current) use of insulin: Secondary | ICD-10-CM

## 2021-04-24 DIAGNOSIS — E119 Type 2 diabetes mellitus without complications: Secondary | ICD-10-CM

## 2021-04-25 ENCOUNTER — Other Ambulatory Visit: Payer: Self-pay | Admitting: Internal Medicine

## 2021-04-25 ENCOUNTER — Telehealth: Payer: Self-pay

## 2021-04-25 DIAGNOSIS — Z794 Long term (current) use of insulin: Secondary | ICD-10-CM

## 2021-04-25 DIAGNOSIS — E119 Type 2 diabetes mellitus without complications: Secondary | ICD-10-CM

## 2021-04-25 NOTE — Telephone Encounter (Signed)
Nichole called from [employer] needs prior authorization on Vitamin D, Ergocalciferol, (DRISDOL) 1.25 MG (50000 UNIT) CAPS capsule [[id number]]

## 2021-04-25 NOTE — Telephone Encounter (Signed)
It is fine to use the [employer].

## 2021-04-25 NOTE — Telephone Encounter (Signed)
Pt called for a PA on her novolog. I advised pt that it looks like her refill request for that went to Dr Posey Pronto and she would need to call her pharmacy and have them send that to [place] for the PA

## 2021-04-25 NOTE — Telephone Encounter (Signed)
Please call and let the pharmacy know.

## 2021-04-25 NOTE — Telephone Encounter (Signed)
Please have them fax over the form for prior authorization

## 2021-04-25 NOTE — Telephone Encounter (Signed)
Called [employer] to fax form over.

## 2021-04-27 ENCOUNTER — Telehealth: Payer: Self-pay

## 2021-04-27 DIAGNOSIS — E119 Type 2 diabetes mellitus without complications: Secondary | ICD-10-CM

## 2021-04-27 DIAGNOSIS — Z794 Long term (current) use of insulin: Secondary | ICD-10-CM

## 2021-04-27 MED ORDER — INSULIN LISPRO (1 UNIT DIAL) 100 UNIT/ML (KWIKPEN): PEN_INJECTOR | SUBCUTANEOUS | 3 refills | Status: DC

## 2021-04-27 NOTE — Telephone Encounter (Signed)
Pt is saying her [facility] will not give her Humalog to her until the directions are fixed. It looks like Dr Posey Pronto sent this in

## 2021-04-27 NOTE — Telephone Encounter (Signed)
Rx sent.  Pt notified. 

## 2021-04-28 ENCOUNTER — Telehealth: Payer: Self-pay

## 2021-04-28 ENCOUNTER — Other Ambulatory Visit: Payer: Self-pay

## 2021-04-28 NOTE — Telephone Encounter (Signed)
Pt states that she received a text that the Humalog needed PA. I asked her what her Prescription drug coverage was. She says she does know and neither does the pharmacy or [place]. I told her I would look through her chart and call pharmacy?

## 2021-04-28 NOTE — Telephone Encounter (Signed)
Pharmacy states that the pts Humalog has been filled and is ready for her to pick up. They will text her again.

## 2021-04-28 NOTE — Telephone Encounter (Signed)
Patient needs [place] to fax [facility] with the directions for her Novolog. [phone] customer service number because she does not have a fax number.

## 2021-05-12 ENCOUNTER — Other Ambulatory Visit: Payer: Self-pay

## 2021-05-12 ENCOUNTER — Ambulatory Visit (INDEPENDENT_AMBULATORY_CARE_PROVIDER_SITE_OTHER): Payer: 59 | Admitting: Cardiology

## 2021-05-12 ENCOUNTER — Other Ambulatory Visit (HOSPITAL_COMMUNITY)
Admission: RE | Admit: 2021-05-12 | Discharge: 2021-05-12 | Disposition: A | Discharge status: Home / Self Care | Payer: 59 | Source: Ambulatory Visit | Attending: Cardiology | Admitting: Cardiology

## 2021-05-12 ENCOUNTER — Encounter: Payer: Self-pay | Admitting: Cardiology

## 2021-05-12 VITALS — BP 140/84 | HR 93 | Ht 60.0 in | Wt 209.0 lb

## 2021-05-12 DIAGNOSIS — J90 Pleural effusion, not elsewhere classified: Secondary | ICD-10-CM | POA: Diagnosis not present

## 2021-05-12 DIAGNOSIS — J9611 Chronic respiratory failure with hypoxia: Secondary | ICD-10-CM | POA: Diagnosis not present

## 2021-05-12 LAB — BASIC METABOLIC PANEL
Anion gap: 6 (ref 5–15)
BUN: 13 mg/dL (ref 6–20)
CO2: 28 mmol/L (ref 22–32)
Calcium: 9.5 mg/dL (ref 8.9–10.3)
Chloride: 103 mmol/L (ref 98–111)
Creatinine, Ser: 1.03 mg/dL — ABNORMAL HIGH (ref 0.44–1.00)
GFR, Estimated: >60 mL/min (ref >60)
Glucose, Bld: 189 mg/dL — ABNORMAL HIGH (ref 70–99)
Potassium: 4.4 mmol/L (ref 3.5–5.1)
Sodium: 137 mmol/L (ref 135–145)

## 2021-05-12 NOTE — Progress Notes (Signed)
Cardiology Office Note  Date: 05/12/2021   ID: Christine Weaver, DOB [birth year], MRN [id number]  PCP:  Lindell Spar, MD  Cardiologist:  Rozann Lesches, MD Electrophysiologist:  None   Chief Complaint  Patient presents with   Cardiac follow-up    History of Present Illness: Christine Weaver is a 55 y.o. female last seen in March by Ms. Strader PA-C, I reviewed the note.  She presents for a follow-up visit.  Continues to wear supplemental oxygen and use nebulizer treatments as needed.  She is not following with a pulmonologist as yet.  We saw her initially due to incidental finding of pleural effusions as she was undergoing work-up for hypoxic respiratory failure in the ER.  She did undergo a chest CTA in February that was negative for pulmonary embolus and showed mild cardiomegaly.  Pleural effusions were noted, left greater than right, no obvious masses.  She carries a diagnosis of COPD and asthma.  Without oxygen she states that her oxygen saturation can dip into the 70s with activity.  She has maintained Lasix 20 mg twice daily with improvement in effusions and fluid weight.  Echocardiogram also done in February shows normal LVEF at 55 to 60% with mild LVH and increased left atrial pressure, normal RV contraction.  Past Medical History:  Diagnosis Date   Asthma    COPD (chronic obstructive pulmonary disease) ([place])    Hyperlipidemia    Hypertension    Type 2 diabetes mellitus ([place])     Past Surgical History:  Procedure Laterality Date   CESAREAN SECTION      Current Outpatient Medications  Medication Sig Dispense Refill   albuterol (PROVENTIL) (2.5 MG/3ML) 0.083% nebulizer solution Take 2.5 mg by nebulization every 4 (four) hours as needed for wheezing or shortness of breath.     albuterol (VENTOLIN HFA) 108 (90 Base) MCG/ACT inhaler Inhale 2 puffs into the lungs every 4 (four) hours as needed for wheezing or shortness of breath. 18 g 5   amLODipine (NORVASC) 10 MG tablet  Take 1 tablet (10 mg total) by mouth daily. 90 tablet 1   atorvastatin (LIPITOR) 80 MG tablet Take 1 tablet (80 mg total) by mouth daily. 90 tablet 1   BD PEN NEEDLE NANO 2ND GEN 32G X 4 MM MISC USE 1 PEN NEEDLE FOR 6 INJECTIONS DAILY FOR DIABETES E11.9 200 each 11   Continuous Blood Gluc Receiver (FREESTYLE LIBRE 2 READER) DEVI As directed 1 each 0   Continuous Blood Gluc Sensor (FREESTYLE LIBRE 2 SENSOR) MISC 1 Piece by Does not apply route every 14 (fourteen) days. 2 each 3   fluconazole (DIFLUCAN) 150 MG tablet Take 150 mg by mouth as needed.     fluticasone (FLONASE) 50 MCG/ACT nasal spray Place into both nostrils daily.     fluticasone-salmeterol (ADVAIR HFA) 115-21 MCG/ACT inhaler Inhale 2 puffs into the lungs 2 (two) times daily as needed (wheezing).      furosemide (LASIX) 20 MG tablet TAKE 1 TABLET BY MOUTH TWICE A DAY 180 tablet 3   glucose blood (ACCU-CHEK AVIVA PLUS) test strip TEST BLOOD SUGAR ONE TO TWO TIMES A DAY as directed     HYDROcodone-acetaminophen (NORCO/VICODIN) 5-325 MG tablet Take one tab po q 4 hrs prn pain 10 tablet 0   hydroxypropyl methylcellulose / hypromellose (ISOPTO TEARS / GONIOVISC) 2.5 % ophthalmic solution Place 1 drop into both eyes 3 (three) times daily. 15 mL 1   insulin glargine (LANTUS) 100 UNIT/ML injection Inject 70  Units into the skin at bedtime.     insulin lispro (HUMALOG KWIKPEN) 100 UNIT/ML KwikPen 10-16 units tidac. 15 mL 3   liraglutide (VICTOZA) 18 MG/3ML SOPN Inject 1.8 mg into the skin daily.      lisinopril (ZESTRIL) 30 MG tablet Take 1 tablet (30 mg total) by mouth daily. 90 tablet 1   metFORMIN (GLUCOPHAGE) 1000 MG tablet Take 1,000 mg by mouth 2 (two) times daily with a meal.      nystatin cream (MYCOSTATIN) Apply 1 application topically 2 (two) times daily.     omeprazole (PRILOSEC) 40 MG capsule Take 1 capsule (40 mg total) by mouth daily. 90 capsule 1   SSD 1 % cream Apply 1 application topically daily. For rash  0   Vitamin D,  Ergocalciferol, (DRISDOL) 1.25 MG (50000 UNIT) CAPS capsule TAKE 1 CAPSULE (50,000 UNITS TOTAL) BY MOUTH EVERY 7 (SEVEN) DAYS 12 capsule 1   ANORO ELLIPTA 62.5-25 MCG/INH AEPB Inhale 1 puff into the lungs daily. (Patient not taking: Reported on 05/12/2021) 14 each 5   ibuprofen (ADVIL,MOTRIN) 800 MG tablet Take 800 mg by mouth 2 (two) times daily. (Patient not taking: Reported on 05/12/2021)     predniSONE (STERAPRED UNI-PAK 21 TAB) 10 MG (21) TBPK tablet Take as package instructions. (Patient not taking: No sig reported) 1 each 0   valACYclovir (VALTREX) 1000 MG tablet Take 1 tablet (1,000 mg total) by mouth 3 (three) times daily. (Patient not taking: No sig reported) 21 tablet 0   No current facility-administered medications for this visit.   Allergies:  Morphine and related, Other, Oxycodone-acetaminophen, Latex, and Morphine   ROS: No palpitations or syncope.  Physical Exam: VS:  BP 140/84   Pulse 93   Ht 5' (1.524 m)   Wt 209 lb (94.8 kg)   SpO2 97%   BMI 40.82 kg/m , BMI Body mass index is 40.82 kg/m.  Wt Readings from Last 3 Encounters:  05/12/21 209 lb (94.8 kg)  02/24/21 215 lb 3.2 oz (97.6 kg)  02/02/21 211 lb 1.9 oz (95.8 kg)    General: Patient appears comfortable at rest.  Wearing oxygen via nasal cannula. HEENT: Conjunctiva and lids normal, wearing a mask. Neck: Supple, no elevated JVP or carotid bruits, no thyromegaly. Lungs: No active wheezing, decreased breath sounds at the very bases. Cardiac: Regular rate and rhythm, no S3 or significant systolic murmur, no pericardial rub.  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  Recent Labwork: 11/11/2020: ALT 14; AST 10 12/09/2020: Magnesium 1.8 12/10/2020: Hemoglobin 13.3; Platelets 248 01/12/2021: B Natriuretic Peptide 27.0; BUN 12; Creatinine, Ser 0.95; Potassium 4.2; Sodium 141 01/27/2021: TSH 1.730     Component Value Date/Time    CHOL 175 11/11/2020 0817   TRIG 159 (H) 11/11/2020 0817   HDL 45 11/11/2020 0817   CHOLHDL 3.9 11/11/2020 0817   LDLCALC 102 (H) 11/11/2020 0817    Other Studies Reviewed Today:  Echocardiogram 12/09/2020:  1. Left ventricular ejection fraction, by estimation, is 55 to 60%. The  left ventricle has normal function. The left ventricle has no regional  wall motion abnormalities. There is mild left ventricular hypertrophy.  Left ventricular diastolic parameters  are indeterminate. Elevated left atrial pressure.   2. Right ventricular systolic function is normal. The right ventricular  size is normal.   3. The mitral valve was not well visualized. No evidence of mitral valve  regurgitation. No evidence  of mitral stenosis.   4. The aortic valve has an indeterminant number of cusps. Aortic valve  regurgitation is not visualized. No aortic stenosis is present.   5. The inferior vena cava is normal in size with greater than 50%  respiratory variability, suggesting right atrial pressure of 3 mmHg.   Assessment and Plan:  1.  History of bilateral pleural effusions, presumably associated with diastolic heart failure.  She has had improvement on Lasix which remains at 20 mg twice daily.  Check BMET.  Continue to limit salt in the diet.  She is also on lisinopril.  2.  Chronic hypoxic respiratory failure with reported history of COPD and asthma.  She is on supplemental oxygen, has not had formal pulmonary consultation which will be arranged.  She states that her oxygen saturation can dip into the 70s if she is active off oxygen.  Uses nebulizers intermittently at home.   Medication Adjustments/Labs and Tests Ordered: Current medicines are reviewed at length with the patient today.  Concerns regarding medicines are outlined above.   Tests Ordered: Orders Placed This Encounter  Procedures   Basic metabolic panel   Ambulatory referral to Pulmonology     Medication Changes: No orders of the  defined types were placed in this encounter.   Disposition:  Follow up  6 months.  Signed, Satira Sark, MD, [facility] 05/12/2021 11:47 AM    [facility]. [address] [place], [place] [address] Phone: [phone]; Fax: [phone]

## 2021-05-12 NOTE — Patient Instructions (Addendum)
Medication Instructions:  Your physician recommends that you continue on your current medications as directed. Please refer to the Current Medication list given to you today.  *If you need a refill on your cardiac medications before your next appointment, please call your pharmacy*   Lab Work:  BMET Today    If you have labs (blood work) drawn today and your tests are completely normal, you will receive your results only by: [place] (if you have MyChart) OR A paper copy in the mail If you have any lab test that is abnormal or we need to change your treatment, we will call you to review the results.   Testing/Procedures: None today    Follow-Up: At [facility], you and your health needs are our priority.  As part of our continuing mission to provide you with exceptional heart care, we have created designated Provider Care Teams.  These Care Teams include your primary Cardiologist (physician) and Advanced Practice Providers (APPs -  Physician Assistants and Nurse Practitioners) who all work together to provide you with the care you need, when you need it.  We recommend signing up for the patient portal called "MyChart".  Sign up information is provided on this After Visit Summary.  MyChart is used to connect with patients for Virtual Visits (Telemedicine).  Patients are able to view lab/test results, encounter notes, upcoming appointments, etc.  Non-urgent messages can be sent to your provider as well.   To learn more about what you can do with MyChart, go to [email or web address].    Your next appointment:   6 month(s)  The format for your next appointment:   In Person  Provider:   Rozann Lesches, MD   Other Instructions You have been referred to [facility] Pulmonology. They will call you to schedule an appointment.

## 2021-05-13 ENCOUNTER — Telehealth: Payer: Self-pay

## 2021-05-13 NOTE — Telephone Encounter (Signed)
Patient notified and voiced understanding. Pt had no questions or concerns at this time.

## 2021-05-13 NOTE — Telephone Encounter (Signed)
-----   Message from Satira Sark, MD sent at 05/12/2021  7:43 PM EDT ----- Results reviewed.  Creatinine 1.03 and potassium 4.4.  Continue current Lasix dose for now.

## 2021-05-22 ENCOUNTER — Other Ambulatory Visit: Payer: Self-pay | Admitting: Internal Medicine

## 2021-05-22 DIAGNOSIS — I10 Essential (primary) hypertension: Secondary | ICD-10-CM

## 2021-05-31 LAB — HM DIABETES EYE EXAM

## 2021-06-02 ENCOUNTER — Ambulatory Visit (INDEPENDENT_AMBULATORY_CARE_PROVIDER_SITE_OTHER): Payer: 59 | Admitting: Internal Medicine

## 2021-06-02 ENCOUNTER — Other Ambulatory Visit: Payer: Self-pay

## 2021-06-02 ENCOUNTER — Encounter: Payer: Self-pay | Admitting: Internal Medicine

## 2021-06-02 VITALS — BP 102/70 | HR 109 | Temp 98.4°F | Resp 18 | Ht 60.0 in | Wt 209.1 lb

## 2021-06-02 DIAGNOSIS — I5032 Chronic diastolic (congestive) heart failure: Secondary | ICD-10-CM | POA: Diagnosis not present

## 2021-06-02 DIAGNOSIS — J9611 Chronic respiratory failure with hypoxia: Secondary | ICD-10-CM

## 2021-06-02 DIAGNOSIS — Z124 Encounter for screening for malignant neoplasm of cervix: Secondary | ICD-10-CM

## 2021-06-02 DIAGNOSIS — E119 Type 2 diabetes mellitus without complications: Secondary | ICD-10-CM

## 2021-06-02 DIAGNOSIS — Z794 Long term (current) use of insulin: Secondary | ICD-10-CM

## 2021-06-02 DIAGNOSIS — E782 Mixed hyperlipidemia: Secondary | ICD-10-CM

## 2021-06-02 DIAGNOSIS — J449 Chronic obstructive pulmonary disease, unspecified: Secondary | ICD-10-CM | POA: Diagnosis not present

## 2021-06-02 DIAGNOSIS — Z1231 Encounter for screening mammogram for malignant neoplasm of breast: Secondary | ICD-10-CM

## 2021-06-02 DIAGNOSIS — I10 Essential (primary) hypertension: Secondary | ICD-10-CM

## 2021-06-02 NOTE — Patient Instructions (Signed)
Please continue taking medications as prescribed.  Please follow up with GI and Ob/Gyn as scheduled.  Please continue to follow low carb and low salt diet and perform moderate exercise/walking at least 150 mins/week.

## 2021-06-02 NOTE — Assessment & Plan Note (Signed)
Lab Results  Component Value Date   HGBA1C 9.0 (A) 02/24/2021   Takes Lantus 70 U qHS, Novolog 14-20 U TID according to sliding scale Trulicity 1.8 mg QD Metformin 1000 mg BID Follows up with Dr Dorris Fetch On statin and ACEi Referred to Ophthalmology for diabetic eye exam

## 2021-06-02 NOTE — Assessment & Plan Note (Signed)
Appears euvolemic Continue Lasix Follows up with Cardiologist

## 2021-06-02 NOTE — Progress Notes (Signed)
Established Patient Office Visit  Subjective:  Patient ID: Christine Weaver, female    DOB: [birth year]  Age: 55 y.o. MRN: [id number]  CC:  Chief Complaint  Patient presents with   Follow-up    4 month follow up     HPI Christine Weaver is a 55 y.o. female with PMH of COPD, chronic respiratory failure, HTN, HFpEF and type 2 DM who presents for follow up of her chronic medical conditions.  HTN: BP is well-controlled. Takes medications regularly. Patient denies headache, dizziness, chest pain, dyspnea or palpitations.  HFpEF: She denies orthopnea or PND. Takes Lasix regularly.   COPD: On home O2 - 2 lpm now. Uses Anoro regularly. Denies any need to use Albuterol recently. Denies dyspnea or wheezing currently.  She has not visited Ob/Gyn and GI yet.  Past Medical History:  Diagnosis Date   Asthma    COPD (chronic obstructive pulmonary disease) ([place])    Hyperlipidemia    Hypertension    Type 2 diabetes mellitus ([place])     Past Surgical History:  Procedure Laterality Date   CESAREAN SECTION      Family History  Problem Relation Age of Onset   Thyroid disease Mother    Hypertension Mother    Heart attack Mother    Stroke Mother    Heart failure Mother    Diabetes Mother    Diabetes Father    Heart attack Father    Stroke Father    Kidney disease Father    Heart failure Father     Social History   Socioeconomic History   Marital status: Married    Spouse name: Not on file   Number of children: Not on file   Years of education: Not on file   Highest education level: Not on file  Occupational History   Not on file  Tobacco Use   Smoking status: Former    Types: Cigarettes   Smokeless tobacco: Never  Vaping Use   Vaping Use: Never used  Substance and Sexual Activity   Alcohol use: Not on file   Drug use: Not on file   Sexual activity: Not on file  Other Topics Concern   Not on file  Social History Narrative   Not on file   Social Determinants of Health    Financial Resource Strain: Not on file  Food Insecurity: Not on file  Transportation Needs: Not on file  Physical Activity: Not on file  Stress: Not on file  Social Connections: Not on file  Intimate Partner Violence: Not on file    Outpatient Medications Prior to Visit  Medication Sig Dispense Refill   albuterol (PROVENTIL) (2.5 MG/3ML) 0.083% nebulizer solution Take 2.5 mg by nebulization every 4 (four) hours as needed for wheezing or shortness of breath.     albuterol (VENTOLIN HFA) 108 (90 Base) MCG/ACT inhaler Inhale 2 puffs into the lungs every 4 (four) hours as needed for wheezing or shortness of breath. 18 g 5   amLODipine (NORVASC) 10 MG tablet Take 1 tablet (10 mg total) by mouth daily. 90 tablet 1   ANORO ELLIPTA 62.5-25 MCG/INH AEPB Inhale 1 puff into the lungs daily. 14 each 5   atorvastatin (LIPITOR) 80 MG tablet Take 1 tablet (80 mg total) by mouth daily. 90 tablet 1   BD PEN NEEDLE NANO 2ND GEN 32G X 4 MM MISC USE 1 PEN NEEDLE FOR 6 INJECTIONS DAILY FOR DIABETES E11.9 200 each 11   Continuous Blood Gluc Receiver (FREESTYLE  LIBRE 2 READER) DEVI As directed 1 each 0   fluconazole (DIFLUCAN) 150 MG tablet Take 150 mg by mouth as needed.     fluticasone (FLONASE) 50 MCG/ACT nasal spray Place into both nostrils daily.     fluticasone-salmeterol (ADVAIR HFA) 115-21 MCG/ACT inhaler Inhale 2 puffs into the lungs 2 (two) times daily as needed (wheezing).      furosemide (LASIX) 20 MG tablet TAKE 1 TABLET BY MOUTH TWICE A DAY 180 tablet 3   glucose blood (ACCU-CHEK AVIVA PLUS) test strip TEST BLOOD SUGAR ONE TO TWO TIMES A DAY as directed     HYDROcodone-acetaminophen (NORCO/VICODIN) 5-325 MG tablet Take 1 tablet by mouth every 12 (twelve) hours as needed.     hydroxypropyl methylcellulose / hypromellose (ISOPTO TEARS / GONIOVISC) 2.5 % ophthalmic solution Place 1 drop into both eyes 3 (three) times daily. 15 mL 1   ibuprofen (ADVIL,MOTRIN) 800 MG tablet Take 800 mg by mouth 2  (two) times daily.     insulin glargine (LANTUS) 100 UNIT/ML injection Inject 70 Units into the skin at bedtime.     insulin lispro (HUMALOG KWIKPEN) 100 UNIT/ML KwikPen 10-16 units tidac. 15 mL 3   liraglutide (VICTOZA) 18 MG/3ML SOPN Inject 1.8 mg into the skin daily.      lisinopril (ZESTRIL) 30 MG tablet TAKE 1 TABLET BY MOUTH EVERY DAY 90 tablet 1   metFORMIN (GLUCOPHAGE) 1000 MG tablet Take 1,000 mg by mouth 2 (two) times daily with a meal.      nystatin cream (MYCOSTATIN) Apply 1 application topically 2 (two) times daily.     omeprazole (PRILOSEC) 40 MG capsule Take 1 capsule (40 mg total) by mouth daily. 90 capsule 1   SSD 1 % cream Apply 1 application topically daily. For rash  0   valACYclovir (VALTREX) 1000 MG tablet Take 1 tablet (1,000 mg total) by mouth 3 (three) times daily. 21 tablet 0   Vitamin D, Ergocalciferol, (DRISDOL) 1.25 MG (50000 UNIT) CAPS capsule TAKE 1 CAPSULE (50,000 UNITS TOTAL) BY MOUTH EVERY 7 (SEVEN) DAYS 12 capsule 1   HYDROcodone-acetaminophen (NORCO/VICODIN) 5-325 MG tablet Take one tab po q 4 hrs prn pain 10 tablet 0   predniSONE (STERAPRED UNI-PAK 21 TAB) 10 MG (21) TBPK tablet Take as package instructions. 1 each 0   Continuous Blood Gluc Sensor (FREESTYLE LIBRE 2 SENSOR) MISC 1 Piece by Does not apply route every 14 (fourteen) days. (Patient not taking: Reported on 06/02/2021) 2 each 3   No facility-administered medications prior to visit.    Allergies  Allergen Reactions   Morphine And Related    Other Other (See Comments)    Bells Palsy    Oxycodone-Acetaminophen Other (See Comments)    Constipates patient    Latex Itching and Other (See Comments)    Skin "cracks" open    Morphine Itching and Other (See Comments)    ROS Review of Systems  Constitutional:  Negative for chills and fever.  HENT:  Negative for congestion, sinus pressure, sinus pain and sore throat.   Eyes:  Negative for pain and discharge.  Respiratory:  Negative for cough,  shortness of breath and wheezing.   Cardiovascular:  Negative for chest pain and palpitations.  Gastrointestinal:  Negative for abdominal pain, constipation, diarrhea, nausea and vomiting.  Endocrine: Negative for polydipsia and polyuria.  Genitourinary:  Negative for dysuria and hematuria.  Musculoskeletal:  Positive for arthralgias and back pain. Negative for neck pain and neck stiffness.  Skin:  Negative  for rash.  Neurological:  Negative for dizziness and weakness.  Psychiatric/Behavioral:  Negative for agitation and behavioral problems.      Objective:    Physical Exam Vitals reviewed.  Constitutional:      General: She is not in acute distress.    Appearance: She is not diaphoretic.  HENT:     Head: Normocephalic and atraumatic.     Nose: Nose normal.     Mouth/Throat:     Mouth: Mucous membranes are moist.  Eyes:     General: No scleral icterus.    Extraocular Movements: Extraocular movements intact.     Pupils: Pupils are equal, round, and reactive to light.  Cardiovascular:     Rate and Rhythm: Normal rate and regular rhythm.     Pulses: Normal pulses.     Heart sounds: Normal heart sounds. No murmur heard. Pulmonary:     Breath sounds: Normal breath sounds. No wheezing or rales.     Comments: On 2 lpm O2 Musculoskeletal:     Cervical back: Neck supple. No tenderness.     Right lower leg: No edema.     Left lower leg: No edema.  Skin:    General: Skin is warm.     Findings: No rash.  Neurological:     General: No focal deficit present.     Mental Status: She is alert and oriented to person, place, and time.  Psychiatric:        Mood and Affect: Mood normal.        Behavior: Behavior normal.    BP 102/70 (BP Location: Left Arm, Patient Position: Sitting, Cuff Size: Normal)   Pulse (!) 109   Temp 98.4 F (36.9 C) (Oral)   Resp 18   Ht 5' (1.524 m)   Wt 209 lb 1.9 oz (94.9 kg)   SpO2 95%   BMI 40.84 kg/m  Wt Readings from Last 3 Encounters:  06/02/21  209 lb 1.9 oz (94.9 kg)  05/12/21 209 lb (94.8 kg)  02/24/21 215 lb 3.2 oz (97.6 kg)     Health Maintenance Due  Topic Date Due   Pneumococcal Vaccine 16-78 Years old (1 - PCV) Never done   OPHTHALMOLOGY EXAM  Never done   Zoster Vaccines- Shingrix (1 of 2) Never done   PAP SMEAR-Modifier  Never done   COLONOSCOPY (Pts 45-29yr Insurance coverage will need to be confirmed)  Never done   MAMMOGRAM  Never done   COVID-19 Vaccine (3 - Moderna risk series) 12/11/2020   INFLUENZA VACCINE  05/30/2021    There are no preventive care reminders to display for this patient.  Lab Results  Component Value Date   TSH 1.730 01/27/2021   Lab Results  Component Value Date   WBC 11.3 (H) 12/10/2020   HGB 13.3 12/10/2020   HCT 45.1 12/10/2020   MCV 82.1 12/10/2020   PLT 248 12/10/2020   Lab Results  Component Value Date   NA 137 05/12/2021   K 4.4 05/12/2021   CO2 28 05/12/2021   GLUCOSE 189 (H) 05/12/2021   BUN 13 05/12/2021   CREATININE 1.03 (H) 05/12/2021   BILITOT 0.3 11/11/2020   ALKPHOS 116 11/11/2020   AST 10 11/11/2020   ALT 14 11/11/2020   PROT 5.9 (L) 11/11/2020   ALBUMIN 3.5 (L) 11/11/2020   CALCIUM 9.5 05/12/2021   ANIONGAP 6 05/12/2021   Lab Results  Component Value Date   CHOL 175 11/11/2020   Lab Results  Component Value  Date   HDL 45 11/11/2020   Lab Results  Component Value Date   LDLCALC 102 (H) 11/11/2020   Lab Results  Component Value Date   TRIG 159 (H) 11/11/2020   Lab Results  Component Value Date   CHOLHDL 3.9 11/11/2020   Lab Results  Component Value Date   HGBA1C 9.0 (A) 02/24/2021      Assessment & Plan:   Problem List Items Addressed This Visit       Cardiovascular and Mediastinum   Essential hypertension, benign    BP Readings from Last 1 Encounters:  06/02/21 102/70  Well-controlled with Amlodipine and Lisinopril for now Counseled for compliance with the medications Advised DASH diet and moderate exercise/walking, at  least 150 mins/week      (HFpEF) heart failure with preserved ejection fraction ([place])    Appears euvolemic Continue Lasix Follows up with Cardiologist         Respiratory   Chronic obstructive pulmonary disease (HCC)    On Anoro Ellipta and Advair Uses Albuterol nebs and inhaler PRN On home O2 - 2 lpm PRN       Chronic respiratory failure with hypoxia ([place]) - Primary    Uses 2 lpm O2 Uses Anoro for COPD F/u Pulmonology Quit smoking in 2022, smoked about 0.5 pack/day for about 35 years         Endocrine   Type 2 diabetes mellitus without complication, with long-term current use of insulin (HCC)    Lab Results  Component Value Date   HGBA1C 9.0 (A) 02/24/2021  Takes Lantus 70 U qHS, Novolog 14-20 U TID according to sliding scale Trulicity 1.8 mg QD Metformin 1000 mg BID Follows up with Dr Dorris Fetch On statin and ACEi Referred to Ophthalmology for diabetic eye exam        Other   Mixed hyperlipidemia    On Lipitor Check lipid profile in the next visit       Other Visit Diagnoses     Screening mammogram for breast cancer       Relevant Orders   MM 3D SCREEN BREAST BILATERAL   Routine cervical smear       Relevant Orders   Ambulatory referral to Obstetrics / Gynecology       No orders of the defined types were placed in this encounter.   Follow-up: Return in about 4 months (around 10/02/2021) for Annual physical.    Lindell Spar, MD

## 2021-06-02 NOTE — Assessment & Plan Note (Signed)
On Lipitor Check lipid profile in the next visit

## 2021-06-02 NOTE — Assessment & Plan Note (Signed)
BP Readings from Last 1 Encounters:  06/02/21 102/70   Well-controlled with Amlodipine and Lisinopril for now Counseled for compliance with the medications Advised DASH diet and moderate exercise/walking, at least 150 mins/week

## 2021-06-02 NOTE — Assessment & Plan Note (Signed)
On Anoro Ellipta and Advair Uses Albuterol nebs and inhaler PRN On home O2 - 2 lpm PRN

## 2021-06-02 NOTE — Assessment & Plan Note (Addendum)
Uses 2 lpm O2 Uses Anoro for COPD F/u Pulmonology Quit smoking in 2022, smoked about 0.5 pack/day for about 35 years

## 2021-06-15 ENCOUNTER — Other Ambulatory Visit: Payer: Self-pay | Admitting: "Endocrinology

## 2021-06-15 DIAGNOSIS — E119 Type 2 diabetes mellitus without complications: Secondary | ICD-10-CM

## 2021-06-20 ENCOUNTER — Ambulatory Visit (HOSPITAL_COMMUNITY)
Admission: RE | Admit: 2021-06-20 | Discharge: 2021-06-20 | Disposition: A | Discharge status: Home / Self Care | Payer: 59 | Source: Ambulatory Visit | Attending: Internal Medicine | Admitting: Internal Medicine

## 2021-06-20 ENCOUNTER — Other Ambulatory Visit: Payer: Self-pay

## 2021-06-20 DIAGNOSIS — Z1231 Encounter for screening mammogram for malignant neoplasm of breast: Secondary | ICD-10-CM | POA: Diagnosis present

## 2021-06-28 ENCOUNTER — Other Ambulatory Visit: Payer: Self-pay

## 2021-06-28 ENCOUNTER — Ambulatory Visit (INDEPENDENT_AMBULATORY_CARE_PROVIDER_SITE_OTHER): Payer: 59 | Admitting: "Endocrinology

## 2021-06-28 ENCOUNTER — Encounter: Payer: Self-pay | Admitting: "Endocrinology

## 2021-06-28 VITALS — BP 107/73 | HR 102 | Ht 60.0 in | Wt 210.0 lb

## 2021-06-28 DIAGNOSIS — I10 Essential (primary) hypertension: Secondary | ICD-10-CM

## 2021-06-28 DIAGNOSIS — E119 Type 2 diabetes mellitus without complications: Secondary | ICD-10-CM

## 2021-06-28 DIAGNOSIS — Z794 Long term (current) use of insulin: Secondary | ICD-10-CM

## 2021-06-28 DIAGNOSIS — E782 Mixed hyperlipidemia: Secondary | ICD-10-CM

## 2021-06-28 LAB — POCT GLYCOSYLATED HEMOGLOBIN (HGB A1C): HbA1c, POC (controlled diabetic range): 8.2 % — AB (ref 0.0–7.0)

## 2021-06-28 MED ORDER — INSULIN LISPRO (1 UNIT DIAL) 100 UNIT/ML (KWIKPEN): 12.0000 [IU] | PEN_INJECTOR | Freq: Three times a day (TID) | SUBCUTANEOUS | 1 refills | Status: DC

## 2021-06-28 NOTE — Patient Instructions (Signed)

## 2021-06-28 NOTE — Progress Notes (Signed)
06/28/2021, 5:01 PM  Endocrinology follow-up note   Subjective:    Patient ID: Christine Weaver, female    DOB: September 17, 1966.  Christine Weaver is being seen in follow-up after she was seen in consultation for management of currently uncontrolled symptomatic diabetes requested by  Lindell Spar, MD.   Past Medical History:  Diagnosis Date   Asthma    COPD (chronic obstructive pulmonary disease) ([place])    Hyperlipidemia    Hypertension    Type 2 diabetes mellitus ([place])     Past Surgical History:  Procedure Laterality Date   CESAREAN SECTION      Social History   Socioeconomic History   Marital status: Married    Spouse name: Not on file   Number of children: Not on file   Years of education: Not on file   Highest education level: Not on file  Occupational History   Not on file  Tobacco Use   Smoking status: Former    Types: Cigarettes   Smokeless tobacco: Never  Vaping Use   Vaping Use: Never used  Substance and Sexual Activity   Alcohol use: Not on file   Drug use: Not on file   Sexual activity: Not on file  Other Topics Concern   Not on file  Social History Narrative   Not on file   Social Determinants of Health   Financial Resource Strain: Not on file  Food Insecurity: Not on file  Transportation Needs: Not on file  Physical Activity: Not on file  Stress: Not on file  Social Connections: Not on file    Family History  Problem Relation Age of Onset   Thyroid disease Mother    Hypertension Mother    Heart attack Mother    Stroke Mother    Heart failure Mother    Diabetes Mother    Diabetes Father    Heart attack Father    Stroke Father    Kidney disease Father    Heart failure Father     Outpatient Encounter Medications as of 06/28/2021  Medication Sig   albuterol (PROVENTIL) (2.5 MG/3ML) 0.083% nebulizer solution Take 2.5 mg by nebulization every 4 (four) hours as needed for wheezing or shortness of breath.   albuterol  (VENTOLIN HFA) 108 (90 Base) MCG/ACT inhaler Inhale 2 puffs into the lungs every 4 (four) hours as needed for wheezing or shortness of breath.   amLODipine (NORVASC) 10 MG tablet Take 1 tablet (10 mg total) by mouth daily.   ANORO ELLIPTA 62.5-25 MCG/INH AEPB Inhale 1 puff into the lungs daily.   atorvastatin (LIPITOR) 80 MG tablet Take 1 tablet (80 mg total) by mouth daily.   BD PEN NEEDLE NANO 2ND GEN 32G X 4 MM MISC USE 1 PEN NEEDLE FOR 6 INJECTIONS DAILY FOR DIABETES E11.9   Continuous Blood Gluc Receiver (FREESTYLE LIBRE 2 READER) DEVI As directed   fluticasone (FLONASE) 50 MCG/ACT nasal spray Place into both nostrils daily.   fluticasone-salmeterol (ADVAIR HFA) 115-21 MCG/ACT inhaler Inhale 2 puffs into the lungs 2 (two) times daily as needed (wheezing).    furosemide (LASIX) 20 MG tablet TAKE 1 TABLET BY MOUTH TWICE A DAY   glucose blood (ACCU-CHEK AVIVA PLUS) test strip TEST BLOOD SUGAR ONE TO TWO TIMES A DAY as directed   HYDROcodone-acetaminophen (NORCO/VICODIN) 5-325 MG tablet Take 1 tablet by mouth every 12 (twelve) hours as needed.   hydroxypropyl methylcellulose / hypromellose (ISOPTO TEARS / GONIOVISC) 2.5 %  ophthalmic solution Place 1 drop into both eyes 3 (three) times daily.   ibuprofen (ADVIL,MOTRIN) 800 MG tablet Take 800 mg by mouth 2 (two) times daily.   insulin glargine (LANTUS) 100 UNIT/ML injection Inject 80 Units into the skin at bedtime.   insulin lispro (HUMALOG) 100 UNIT/ML KwikPen Inject 12-18 Units into the skin 3 (three) times daily. INJECT 10-16 UNITS INTO THE SKIN 3 TIMES A DAY BEFORE MEALS .   liraglutide (VICTOZA) 18 MG/3ML SOPN Inject 1.8 mg into the skin daily.    lisinopril (ZESTRIL) 30 MG tablet TAKE 1 TABLET BY MOUTH EVERY DAY   metFORMIN (GLUCOPHAGE) 1000 MG tablet Take 1,000 mg by mouth 2 (two) times daily with a meal.    nystatin cream (MYCOSTATIN) Apply 1 application topically 2 (two) times daily.   omeprazole (PRILOSEC) 40 MG capsule Take 1 capsule (40  mg total) by mouth daily.   SSD 1 % cream Apply 1 application topically daily. For rash   Vitamin D, Ergocalciferol, (DRISDOL) 1.25 MG (50000 UNIT) CAPS capsule TAKE 1 CAPSULE (50,000 UNITS TOTAL) BY MOUTH EVERY 7 (SEVEN) DAYS   [DISCONTINUED] insulin lispro (HUMALOG) 100 UNIT/ML KwikPen INJECT 10-16 UNITS INTO THE SKIN 3 TIMES A DAY BEFORE MEALS .   No facility-administered encounter medications on file as of 06/28/2021.    ALLERGIES: Allergies  Allergen Reactions   Morphine And Related    Other Other (See Comments)    Bells Palsy    Oxycodone-Acetaminophen Other (See Comments)    Constipates patient    Latex Itching and Other (See Comments)    Skin "cracks" open    Morphine Itching and Other (See Comments)    VACCINATION STATUS: Immunization History  Administered Date(s) Administered   Moderna Sars-Covid-2 Vaccination 12/11/2019, 11/13/2020   Tdap 03/13/2018    Diabetes She presents for her follow-up diabetic visit. She has type 2 diabetes mellitus. Onset time: She was diagnosed at approximately age of 27 years. Her disease course has been improving. There are no hypoglycemic associated symptoms. Pertinent negatives for hypoglycemia include no confusion, headaches, pallor or seizures. Associated symptoms include fatigue, polydipsia and polyuria. Pertinent negatives for diabetes include no chest pain and no polyphagia. There are no hypoglycemic complications. Symptoms are improving. Risk factors for coronary artery disease include dyslipidemia, diabetes mellitus, hypertension, obesity, sedentary lifestyle and tobacco exposure. Current diabetic treatment includes insulin injections. Her weight is fluctuating minimally. She is following a generally unhealthy diet. When asked about meal planning, she reported none. She has not had a previous visit with a dietitian. She never participates in exercise. Her home blood glucose trend is decreasing steadily. (She has obtained his CGM device.   She presents with a device showing 47% time range, 53% above range.  She has no hypoglycemia.  Her point-of-care A1c is 8.2%, improving from 9%.  Over the last 2 weeks, her average blood glucose has been 184.    ) An ACE inhibitor/angiotensin II receptor blocker is being taken. Eye exam is current.  Hyperlipidemia This is a chronic problem. The problem is uncontrolled. Recent lipid tests were reviewed and are high. Exacerbating diseases include diabetes and obesity. Pertinent negatives include no chest pain, myalgias or shortness of breath. Current antihyperlipidemic treatment includes statins. Risk factors for coronary artery disease include dyslipidemia, diabetes mellitus, family history, hypertension, obesity and a sedentary lifestyle.  Hypertension This is a chronic problem. The current episode started more than 1 year ago. The problem is uncontrolled. Pertinent negatives include no chest pain, headaches,  palpitations or shortness of breath. Risk factors for coronary artery disease include diabetes mellitus, dyslipidemia, obesity, sedentary lifestyle, smoking/tobacco exposure and family history. Past treatments include ACE inhibitors.  Review of systems: Limited as above.    Objective:    Vitals with BMI 06/28/2021 06/02/2021 05/12/2021  Height '5\' 0"'$  '5\' 0"'$  '5\' 0"'$   Weight 210 lbs 209 lbs 2 oz 209 lbs  BMI 41.01 123456 [id number]  Systolic [id number]  Diastolic 73 70 84  Pulse A999333 109 93    BP 107/73   Pulse (!) 102   Ht 5' (1.524 m)   Wt 210 lb (95.3 kg)   BMI 41.01 kg/m   Wt Readings from Last 3 Encounters:  06/28/21 210 lb (95.3 kg)  06/02/21 209 lb 1.9 oz (94.9 kg)  05/12/21 209 lb (94.8 kg)     CMP ( most recent) CMP     Component Value Date/Time   NA 137 05/12/2021 1214   NA 140 11/11/2020 0817   K 4.4 05/12/2021 1214   CL 103 05/12/2021 1214   CO2 28 05/12/2021 1214   GLUCOSE 189 (H) 05/12/2021 1214   BUN 13 05/12/2021 1214   BUN 13 11/11/2020 0817   CREATININE 1.03 (H)  05/12/2021 1214   CALCIUM 9.5 05/12/2021 1214   PROT 5.9 (L) 11/11/2020 0817   ALBUMIN 3.5 (L) 11/11/2020 0817   AST 10 11/11/2020 0817   ALT 14 11/11/2020 0817   ALKPHOS 116 11/11/2020 0817   BILITOT 0.3 11/11/2020 0817   GFRNONAA >60 05/12/2021 1214   GFRAA 80 11/11/2020 0817    Diabetic Labs (most recent): Lab Results  Component Value Date   HGBA1C 8.2 (A) 06/28/2021   HGBA1C 9.0 (A) 02/24/2021   HGBA1C 9.5 (H) 11/11/2020     Lipid Panel ( most recent) Lipid Panel     Component Value Date/Time   CHOL 175 11/11/2020 0817   TRIG 159 (H) 11/11/2020 0817   HDL 45 11/11/2020 0817   CHOLHDL 3.9 11/11/2020 0817   LDLCALC 102 (H) 11/11/2020 0817   LABVLDL 28 11/11/2020 0817      Lab Results  Component Value Date   TSH 1.730 01/27/2021   TSH 2.640 11/11/2020   TSH 1.42 09/23/2019   FREET4 1.25 01/27/2021   FREET4 1.28 11/11/2020      Assessment & Plan:   1. Type 2 diabetes mellitus with hyperglycemia, with long-term current use of insulin (H[facility])  - [place] has currently uncontrolled symptomatic type 2 DM since  55 years of age.  She has obtained his CGM device.  She presents with a device showing 47% time range, 53% above range.  She has no hypoglycemia.  Her point-of-care A1c is 8.2%, improving from 9%.  Over the last 2 weeks, her average blood glucose has been 184.      Recent labs reviewed.  - I had a long discussion with her about the progressive nature of diabetes and the pathology behind its complications. -her diabetes is complicated by obesity/sedentary life, smoking and she remains at a high risk for more acute and chronic complications which include CAD, CVA, CKD, retinopathy, and neuropathy. These are all discussed in detail with her.  - I have counseled her on diet  and weight management  by adopting a carbohydrate restricted/protein rich diet. Patient is encouraged to switch to  unprocessed or minimally processed     complex starch and increased  protein intake (animal or plant source), fruits, and vegetables. -  she  is advised to stick to a routine mealtimes to eat 3 meals  a day and avoid unnecessary snacks ( to snack only to correct hypoglycemia).   - she acknowledges that there is a room for improvement in her food and drink choices. - Suggestion is made for her to avoid simple carbohydrates  from her diet including Cakes, Sweet Desserts, Ice Cream, Soda (diet and regular), Sweet Tea, Candies, Chips, Cookies, Store Bought Juices, Alcohol in Excess of  1-2 drinks a day, Artificial Sweeteners,  Coffee Creamer, and "Sugar-free" Products, Lemonade. This will help patient to have more stable blood glucose profile and potentially avoid unintended weight gain.   - she will be scheduled with Jearld Fenton, RDN, CDE for diabetes education.  - I have approached her with the following individualized plan to manage  her diabetes and patient agrees:   -She presents with her new CGM package.  She will be helped with the first application.    She will continue to need intensive treatment with basal/bolus insulin in order for her to achieve control of diabetes to target.    -Lack of her documented evidence of insulin administration makes it difficult to optimize her treatment.  She is advised to continue  Lantus  70 units nightly, continue NovoLog  10 units  3 times a day with meals  for pre-meal BG readings of 90-'150mg'$ /dl, plus patient specific correction dose for unexpected hyperglycemia above '150mg'$ /dl, associated with strict monitoring of glucose 4 times a day-before meals and at bedtime. -He is advised to wear her CGM at all times.   - she is warned not to take insulin without proper monitoring per orders. - Adjustment parameters are given to her for hypo and hyperglycemia in writing. - she is encouraged to call clinic for blood glucose levels less than 70 or above 200 mg /dl. - she is advised to continue metformin 1000 mg p.o. twice daily,  Victoza 1.8 mg subcutaneously daily.     - Specific targets for  A1c;  LDL, HDL,  and Triglycerides were discussed with the patient.  2) Blood Pressure /Hypertension:   -Her blood pressure is controlled to target.   she is advised to continue her current medications including lisinopril 20 mg p.o. daily with breakfast .  3) Lipids/Hyperlipidemia:   Review of her recent lipid panel showed un controlled  LDL at 102.  She is advised to continue atorvastatin 80 mg p.o. nightly.        Side effects and precautions discussed with her.  4)  Weight/Diet: Her BMI is 41- -  clearly complicating her diabetes care.   she is  a candidate for weight loss. I discussed with her the fact that loss of 5 - 10% of her  current body weight will have the most impact on her diabetes management.  Exercise, and detailed carbohydrates information provided  -  detailed on discharge instructions.  5) Chronic Care/Health Maintenance:  -she  is on ACEI/ARB and Statin medications and  is encouraged to initiate and continue to follow up with Ophthalmology, Dentist,  Podiatrist at least yearly or according to recommendations, and advised to  quit smoking. I have recommended yearly flu vaccine and pneumonia vaccine at least every 5 years; moderate intensity exercise for up to 150 minutes weekly; and  sleep for at least 7 hours a day.  Her recent screening ABI was negative for PAD in February 2022.  Her next study will be due in February 2027 or sooner if needed.   -  she is  advised to maintain close follow up with Lindell Spar, MD for primary care needs, as well as her other providers for optimal and coordinated care.    I spent 40 minutes in the care of the patient today including review of labs from [facility], Lipids, Thyroid Function, Hematology (current and previous including abstractions from other facilities); face-to-face time discussing  her blood glucose readings/logs, discussing hypoglycemia and hyperglycemia  episodes and symptoms, medications doses, her options of short and long term treatment based on the latest standards of care / guidelines;  discussion about incorporating lifestyle medicine;  and documenting the encounter.    Please refer to Patient Instructions for Blood Glucose Monitoring and Insulin/Medications Dosing Guide"  in media tab for additional information. Please  also refer to " Patient Self Inventory" in the Media  tab for reviewed elements of pertinent patient history.  Christine Weaver participated in the discussions, expressed understanding, and voiced agreement with the above plans.  All questions were answered to her satisfaction. she is encouraged to contact clinic should she have any questions or concerns prior to her return visit.   Follow up plan: - Return in about 4 months (around 10/28/2021) for Bring Meter and Logs- A1c in Office.  Glade Lloyd, MD [facility] [address] [place], [place] [address] Phone: [phone]  Fax: [phone]    06/28/2021, 5:01 PM  This note was partially dictated with voice recognition software. Similar sounding words can be transcribed inadequately or may not  be corrected upon review.

## 2021-06-30 ENCOUNTER — Ambulatory Visit (INDEPENDENT_AMBULATORY_CARE_PROVIDER_SITE_OTHER): Payer: 59 | Admitting: Internal Medicine

## 2021-06-30 ENCOUNTER — Other Ambulatory Visit: Payer: Self-pay

## 2021-06-30 ENCOUNTER — Encounter: Payer: Self-pay | Admitting: Internal Medicine

## 2021-06-30 DIAGNOSIS — I10 Essential (primary) hypertension: Secondary | ICD-10-CM | POA: Diagnosis not present

## 2021-06-30 DIAGNOSIS — J449 Chronic obstructive pulmonary disease, unspecified: Secondary | ICD-10-CM | POA: Diagnosis not present

## 2021-06-30 MED ORDER — FAMOTIDINE 20 MG PO TABS: ORAL_TABLET | ORAL | 11 refills | Status: DC

## 2021-06-30 MED ORDER — BUDESONIDE-FORMOTEROL FUMARATE 80-4.5 MCG/ACT IN AERO: INHALATION_SPRAY | RESPIRATORY_TRACT | 12 refills | Status: DC

## 2021-06-30 NOTE — Progress Notes (Signed)
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  Quit work 2004 at 43 at 89   After moving to [facility] 2012 saw [employer] doctor feeling sluggish and dx with asthma > meds didn't help much then started doe which seemed better with alb neb then adair around 2014/15 then 02 early.     History of Present Illness  06/30/2021  Pulmonary/ 1st office eval/ [facility] / [place] [facility]  Chief Complaint  Patient presents with   Follow-up    Pt. Is on 2L O2 when sleeping and out running errands but tries not to wear it in the house unless she needs it. Coughing but not normally coughing up mucus.   Dyspnea:  foodlion 50% using 02 2lpm/  Cough: always coughing some very hoarse x years >minimally productive  Sleep: 45 degrees with pillows  SABA use: no longer needing since 02  02 :  2lpm at bedtime and prn daytime  Intol of dpi   No obvious day to day or daytime variability or assoc excess/ purulent sputum or mucus plugs or hemoptysis or cp or chest tightness, subjective wheeze or overt sinus or hb symptoms.   Sleeping as above without nocturnal  or early am exacerbation  of respiratory  c/o's or need for noct saba. Also denies any obvious fluctuation of symptoms with weather or environmental changes or other aggravating or alleviating factors except as outlined above   No unusual exposure hx or h/o childhood pna/ asthma or knowledge of premature birth.  Current Allergies, Complete Past Medical History, Past Surgical History, Family History, and Social History were reviewed in [employer] record.  ROS  The following are not active complaints unless bolded Hoarseness, sore throat, dysphagia, dental problems, itching, sneezing,  nasal congestion or discharge of excess mucus or  purulent secretions, ear ache,   fever, chills, sweats, unintended wt loss or wt gain, classically pleuritic or exertional cp,  orthopnea pnd or arm/hand swelling  or leg swelling, presyncope, palpitations, abdominal pain, anorexia, nausea, vomiting, diarrhea  or change in bowel habits or change in bladder habits, change in stools or change in urine, dysuria, hematuria,  rash, arthralgias, visual complaints, headache, numbness, weakness or ataxia or problems with walking or coordination,  change in mood or  memory.           Past Medical History:  Diagnosis Date   Asthma    COPD (chronic obstructive pulmonary disease) ([place])    Hyperlipidemia    Hypertension    Type 2 diabetes mellitus ([place])     Outpatient Medications Prior to Visit  Medication Sig Dispense Refill   albuterol (PROVENTIL) (2.5 MG/3ML) 0.083% nebulizer solution Take 2.5 mg by nebulization every 4 (four) hours as needed for wheezing or shortness of breath.     albuterol (VENTOLIN HFA) 108 (90 Base) MCG/ACT inhaler Inhale 2 puffs into the lungs every 4 (four) hours as needed for wheezing or shortness of breath. 18 g 5   amLODipine (NORVASC) 10 MG tablet Take 1 tablet (10 mg total) by mouth daily. 90 tablet 1          atorvastatin (LIPITOR) 80 MG tablet Take 1 tablet (80 mg total) by mouth daily. 90 tablet 1   BD PEN NEEDLE NANO 2ND GEN  32G X 4 MM MISC USE 1 PEN NEEDLE FOR 6 INJECTIONS DAILY FOR DIABETES E11.9 200 each 11   Continuous Blood Gluc Receiver (FREESTYLE LIBRE 2 READER) DEVI As directed 1 each 0   fluticasone (FLONASE) 50 MCG/ACT nasal spray Place into both nostrils daily.     fluticasone-salmeterol (ADVAIR HFA) 115-21 MCG/ACT inhaler Inhale 2 puffs into the lungs 2 (two) times daily as needed (wheezing).      furosemide (LASIX) 20 MG tablet TAKE 1 TABLET BY MOUTH TWICE A DAY 180 tablet 3   glucose blood (ACCU-CHEK AVIVA PLUS) test strip TEST BLOOD SUGAR ONE TO TWO TIMES A DAY as directed     HYDROcodone-acetaminophen  (NORCO/VICODIN) 5-325 MG tablet Take 1 tablet by mouth every 12 (twelve) hours as needed.     hydroxypropyl methylcellulose / hypromellose (ISOPTO TEARS / GONIOVISC) 2.5 % ophthalmic solution Place 1 drop into both eyes 3 (three) times daily. 15 mL 1   ibuprofen (ADVIL,MOTRIN) 800 MG tablet Take 800 mg by mouth 2 (two) times daily.     insulin glargine (LANTUS) 100 UNIT/ML injection Inject 80 Units into the skin at bedtime.     insulin lispro (HUMALOG) 100 UNIT/ML KwikPen Inject 12-18 Units into the skin 3 (three) times daily. INJECT 10-16 UNITS INTO THE SKIN 3 TIMES A DAY BEFORE MEALS . 30 mL 1   liraglutide (VICTOZA) 18 MG/3ML SOPN Inject 1.8 mg into the skin daily.      lisinopril (ZESTRIL) 30 MG tablet TAKE 1 TABLET BY MOUTH EVERY DAY 90 tablet 1   metFORMIN (GLUCOPHAGE) 1000 MG tablet Take 1,000 mg by mouth 2 (two) times daily with a meal.      nystatin cream (MYCOSTATIN) Apply 1 application topically 2 (two) times daily.     omeprazole (PRILOSEC) 40 MG capsule Take 1 capsule (40 mg total) by mouth daily. 90 capsule 1   SSD 1 % cream Apply 1 application topically daily. For rash  0   Vitamin D, Ergocalciferol, (DRISDOL) 1.25 MG (50000 UNIT) CAPS capsule TAKE 1 CAPSULE (50,000 UNITS TOTAL) BY MOUTH EVERY 7 (SEVEN) DAYS 12 capsule 1   No facility-administered medications prior to visit.     Objective:     BP 128/72 (BP Location: Right Wrist, Patient Position: Sitting, Cuff Size: Normal)   Pulse 99   Temp 98.2 F (36.8 C) (Oral)   Ht 5' (1.524 m)   Wt 210 lb 1.6 oz (95.3 kg)   SpO2 99% Comment: 2L o2  BMI 41.03 kg/m   SpO2: 99 % (2L o2)  Hoarse obese amb wf nad   HEENT : pt wearing mask not removed for exam due to covid - 19 concerns.   NECK :  without JVD/Nodes/TM/ nl carotid upstrokes bilaterally   LUNGS: no acc muscle use,  Min barrel  contour chest wall with bilateral  slightly decreased bs s audible wheeze and  without cough on insp or exp maneuvers and min   Hyperresonant  to  percussion bilaterally     CV:  RRR  no s3 or murmur or increase in P2, and no edema   ABD: obese  soft and nontender with pos end  insp Hoover's  in the supine position. No bruits or organomegaly appreciated, bowel sounds nl  MS:   Nl gait/  ext warm without deformities, calf tenderness, cyanosis or clubbing No obvious joint restrictions   SKIN: warm and dry without lesions    NEURO:  alert, approp, nl sensorium with  no  motor or cerebellar deficits apparent.        I personally reviewed images and agree with radiology impression as follows:   Chest CTa  12/10/20 1. No pulmonary emboli. 2. Bilateral pleural effusions layering dependently. Dependent volume loss in both lower lobes, left more than right. 3. Mild cardiomegaly. 4. 1 cm left adrenal adenoma.    Assessment   COPD GOLD ? /  Quit smoking 09/2020  - try off acei 06/30/2021  - change advair to symb80 2bid and max rx for gerd   DDX of  difficult airways management almost all start with A and  include Adherence, Ace Inhibitors, Acid Reflux, Active Sinus Disease, Alpha 1 Antitripsin deficiency, Anxiety masquerading as Airways dz,  ABPA,  Allergy(esp in young), Aspiration (esp in elderly), Adverse effects of meds,  Active smoking or vaping, A bunch of PE's (a small clot burden can't cause this syndrome unless there is already severe underlying pulm or vascular dz with poor reserve) plus two Bs  = Bronchiectasis and Beta blocker use..and one C= CHF  Adherence is always the initial "prime suspect" and is a multilayered concern that requires a "trust but verify" approach in every patient - starting with knowing how to use medications, especially inhalers, correctly, keeping up with refills and understanding the fundamental difference between maintenance and prns vs those medications only taken for a very short course and then stopped and not refilled.  - - The proper method of use, as well as anticipated side  effects, of a metered-dose inhaler were discussed and demonstrated to the patient using teach back method.   - return with all meds in hand using a trust but verify approach to confirm accurate Medication  Reconciliation The principal here is that until we are certain that the  patients are doing what we've asked, it makes no sense to ask them to do more.   ACEi adverse effects at the  top of the usual list of suspects and the only way to rule it out is a trial off > see a/p    ? Acid (or non-acid) GERD > always difficult to exclude as up to 75% of pts in some series report no assoc GI/ Heartburn symptoms> rec max (24h)  acid suppression and diet restrictions/ reviewed and instructions given in writing.   ? Active smoking > denies/ reinforced  ? Allergy > check profile  ? Alpha one > check phenotype   >>> f/u in 6-8 weeks with pfts     Essential hypertension, benign Echo 12/09/20  There is mild left ventricular hypertrophy.  Left ventricular diastolic parameters  are indeterminate. Elevated left atrial pressure. -D/c acei 06/30/2021 due to hoarseness /cough   In the best review of chronic cough to date ( NEJM 2016 375 [phone]) ,  ACEi are now felt to cause cough in up to  20% of pts which is a 4 fold increase from previous reports and does not include the variety of non-specific complaints we see in pulmonary clinic in pts on ACEi but previously attributed to another dx like  Copd/asthma and  include PNDS, throat and chest congestion, "bronchitis", unexplained dyspnea and noct "strangling" sensations, and hoarseness, but also  atypical /refractory GERD symptoms like dysphagia and "bad heartburn"   The only way I know  to prove this is not an "ACEi Case" is a trial off ACEi x a minimum of 6 weeks then regroup.   >>> try olmesartan 20 mg daily and f/u in 6-8 weeks  Morbid obesity due to excess calories (HCC) Body mass index is 41.03 kg/m.   Lab Results  Component Value Date    TSH 1.730 01/27/2021      Contributing to doe and risk of GERD >>>   reviewed the need and the process to achieve and maintain neg calorie balance > defer f/u primary care including intermittently monitoring thyroid status     Each maintenance medication was reviewed in detail including emphasizing most importantly the difference between maintenance and prns and under what circumstances the prns are to be triggered using an action plan format where appropriate.  Total time for H and P, chart review, counseling, reviewing hfa device(s) , directly observing portions of ambulatory 02 saturation study/ and generating customized AVS unique to this office visit / same day charting = 52 min               Christinia Gully, MD 06/30/2021

## 2021-06-30 NOTE — Patient Instructions (Addendum)
Omeprazole 40 mg Take  30-60 min before first meal of the day and Pepcid (famotidine)  20 mg after supper until return to office - this is the best way to tell whether stomach acid is contributing to your problem.    GERD (REFLUX)  is an extremely common cause of respiratory symptoms just like yours , many times with no obvious heartburn at all.    It can be treated with medication, but also with lifestyle changes including elevation of the head of your bed (ideally with 6 -8inch blocks under the headboard of your bed),  Smoking cessation, avoidance of late meals, excessive alcohol, and avoid fatty foods, chocolate, peppermint, colas, red wine, and acidic juices such as orange juice.  NO MINT OR MENTHOL PRODUCTS SO NO COUGH DROPS  USE SUGARLESS CANDY INSTEAD (Jolley ranchers or Stover's or Life Savers) or even ice chips will also do - the key is to swallow to prevent all throat clearing. NO OIL BASED VITAMINS - use powdered substitutes.  Avoid fish oil when coughing.   Stop lisinopril and start olmesartan 20 mg daily in its place  Stop advair and start symbicort 80 Take 2 puffs first thing in am and then another 2 puffs about 12 hours later.   Work on inhaler technique:  relax and gently blow all the way out then take a nice smooth full deep breath back in, triggering the inhaler at same time you start breathing in.  Hold for up to 5 seconds if you can. Blow out thru nose. Rinse and gargle with water when done.  If mouth or throat bother you at all,  try brushing teeth/gums/tongue with arm and hammer toothpaste/ make a slurry and gargle and spit out.       Make sure you check your oxygen saturation  at your highest level of activity  to be sure it stays over 90% and adjust  02 flow upward to maintain this level if needed but remember to turn it back to previous settings when you stop (to conserve your supply).      Please schedule a follow up office visit in 6-8  weeks, call sooner if needed with  pfts on return

## 2021-06-30 NOTE — Addendum Note (Signed)
Addended by: Rosana Berger on: 06/30/2021 03:30 PM   Modules accepted: Orders

## 2021-06-30 NOTE — Assessment & Plan Note (Addendum)
Echo 12/09/20  There is mild left ventricular hypertrophy.  Left ventricular diastolic parameters  are indeterminate. Elevated left atrial pressure. -D/c acei 06/30/2021 due to hoarseness /cough   In the best review of chronic cough to date ( NEJM 2016 375 [phone]) ,  ACEi are now felt to cause cough in up to  20% of pts which is a 4 fold increase from previous reports and does not include the variety of non-specific complaints we see in pulmonary clinic in pts on ACEi but previously attributed to another dx like  Copd/asthma and  include PNDS, throat and chest congestion, "bronchitis", unexplained dyspnea and noct "strangling" sensations, and hoarseness, but also  atypical /refractory GERD symptoms like dysphagia and "bad heartburn"   The only way I know  to prove this is not an "ACEi Case" is a trial off ACEi x a minimum of 6 weeks then regroup.   >>> try olmesartan 20 mg daily and f/u in 6-8 weeks

## 2021-06-30 NOTE — Assessment & Plan Note (Signed)
Body mass index is 41.03 kg/m.   Lab Results  Component Value Date   TSH 1.730 01/27/2021      Contributing to doe and risk of GERD >>>   reviewed the need and the process to achieve and maintain neg calorie balance > defer f/u primary care including intermittently monitoring thyroid status     Each maintenance medication was reviewed in detail including emphasizing most importantly the difference between maintenance and prns and under what circumstances the prns are to be triggered using an action plan format where appropriate.  Total time for H and P, chart review, counseling, reviewing hfa device(s) , directly observing portions of ambulatory 02 saturation study/ and generating customized AVS unique to this office visit / same day charting = 52 min

## 2021-06-30 NOTE — Assessment & Plan Note (Signed)
Quit smoking 09/2020  - try off acei 06/30/2021  - change advair to symb80 2bid and max rx for gerd   DDX of  difficult airways management almost all start with A and  include Adherence, Ace Inhibitors, Acid Reflux, Active Sinus Disease, Alpha 1 Antitripsin deficiency, Anxiety masquerading as Airways dz,  ABPA,  Allergy(esp in young), Aspiration (esp in elderly), Adverse effects of meds,  Active smoking or vaping, A bunch of PE's (a small clot burden can't cause this syndrome unless there is already severe underlying pulm or vascular dz with poor reserve) plus two Bs  = Bronchiectasis and Beta blocker use..and one C= CHF  Adherence is always the initial "prime suspect" and is a multilayered concern that requires a "trust but verify" approach in every patient - starting with knowing how to use medications, especially inhalers, correctly, keeping up with refills and understanding the fundamental difference between maintenance and prns vs those medications only taken for a very short course and then stopped and not refilled.  - - The proper method of use, as well as anticipated side effects, of a metered-dose inhaler were discussed and demonstrated to the patient using teach back method.   - return with all meds in hand using a trust but verify approach to confirm accurate Medication  Reconciliation The principal here is that until we are certain that the  patients are doing what we've asked, it makes no sense to ask them to do more.   ACEi adverse effects at the  top of the usual list of suspects and the only way to rule it out is a trial off > see a/p    ? Acid (or non-acid) GERD > always difficult to exclude as up to 75% of pts in some series report no assoc GI/ Heartburn symptoms> rec max (24h)  acid suppression and diet restrictions/ reviewed and instructions given in writing.   ? Active smoking > denies/ reinforced  ? Allergy > check profile  ? Alpha one > check phenotype   >>> f/u in 6-8 weeks  with pfts

## 2021-07-06 LAB — BASIC METABOLIC PANEL
BUN/Creatinine Ratio: 13 (ref 9–23)
BUN: 13 mg/dL (ref 6–24)
CO2: 27 mmol/L (ref 20–29)
Calcium: 10.2 mg/dL (ref 8.7–10.2)
Chloride: 104 mmol/L (ref 96–106)
Creatinine, Ser: 1.01 mg/dL — ABNORMAL HIGH (ref 0.57–1.00)
Glucose: 165 mg/dL — ABNORMAL HIGH (ref 65–99)
Potassium: 4.4 mmol/L (ref 3.5–5.2)
Sodium: 142 mmol/L (ref 134–144)
eGFR: 67 mL/min/{1.73_m2} (ref >59)

## 2021-07-06 LAB — CBC WITH DIFFERENTIAL/PLATELET
Basophils Absolute: 0.1 10*3/uL (ref 0.0–0.2)
Basos: 1 %
EOS (ABSOLUTE): 0.1 10*3/uL (ref 0.0–0.4)
Eos: 1 %
Hematocrit: 41.2 % (ref 34.0–46.6)
Hemoglobin: 13.2 g/dL (ref 11.1–15.9)
Immature Grans (Abs): 0 10*3/uL (ref 0.0–0.1)
Immature Granulocytes: 0 %
Lymphocytes Absolute: 2.7 10*3/uL (ref 0.7–3.1)
Lymphs: 23 %
MCH: 25.9 pg — ABNORMAL LOW (ref 26.6–33.0)
MCHC: 32 g/dL (ref 31.5–35.7)
MCV: 81 fL (ref 79–97)
Monocytes Absolute: 0.7 10*3/uL (ref 0.1–0.9)
Monocytes: 6 %
Neutrophils Absolute: 8.1 10*3/uL — ABNORMAL HIGH (ref 1.4–7.0)
Neutrophils: 69 %
Platelets: 266 10*3/uL (ref 150–450)
RBC: 5.1 x10E6/uL (ref 3.77–5.28)
RDW: 13.9 % (ref 11.7–15.4)
WBC: 11.8 10*3/uL — ABNORMAL HIGH (ref 3.4–10.8)

## 2021-07-06 LAB — ALPHA-1-ANTITRYPSIN PHENOTYP: A-1 Antitrypsin: 142 mg/dL (ref 101–187)

## 2021-07-06 LAB — IGE: IgE (Immunoglobulin E), Serum: 124 IU/mL (ref 6–495)

## 2021-07-11 ENCOUNTER — Encounter: Payer: Self-pay | Admitting: Obstetrics & Gynecology

## 2021-07-11 ENCOUNTER — Ambulatory Visit (INDEPENDENT_AMBULATORY_CARE_PROVIDER_SITE_OTHER): Payer: 59 | Admitting: Obstetrics & Gynecology

## 2021-07-11 ENCOUNTER — Other Ambulatory Visit: Payer: Self-pay

## 2021-07-11 ENCOUNTER — Other Ambulatory Visit (HOSPITAL_COMMUNITY)
Admission: RE | Admit: 2021-07-11 | Discharge: 2021-07-11 | Disposition: A | Discharge status: Home / Self Care | Payer: 59 | Source: Ambulatory Visit | Attending: Obstetrics & Gynecology | Admitting: Obstetrics & Gynecology

## 2021-07-11 VITALS — BP 134/75 | HR 87 | Ht 60.0 in | Wt 209.4 lb

## 2021-07-11 DIAGNOSIS — Z1151 Encounter for screening for human papillomavirus (HPV): Secondary | ICD-10-CM | POA: Insufficient documentation

## 2021-07-11 DIAGNOSIS — Z01419 Encounter for gynecological examination (general) (routine) without abnormal findings: Secondary | ICD-10-CM

## 2021-07-11 NOTE — Progress Notes (Signed)
   Pelvic Exam Patient name: Christine Weaver MRN [id number]  Date of birth: Nov 16, 1965 Chief Complaint:   Gynecologic Exam (Pap only)  History of Present Illness:   Christine Weaver is a 55 y.o. [id number] postmenopausal female being seen today for a routine well-woman exam.  Today she notes no bleeding or irregular discharge.  No acute complaints  Last pap not sure.  Last mammogram: 05/2021.   Depression screen [facility] 2/9 07/11/2021 06/02/2021 02/08/2021 02/02/2021 01/27/2021  Decreased Interest 0 0 0 0 0  Down, Depressed, Hopeless 0 0 0 0 0  PHQ - 2 Score 0 0 0 0 0  Altered sleeping 0 - - - -  Tired, decreased energy 0 - - - -  Change in appetite 0 - - - -  Feeling bad or failure about yourself  0 - - - -  Trouble concentrating 0 - - - -  Moving slowly or fidgety/restless 0 - - - -  Suicidal thoughts 0 - - - -  PHQ-9 Score 0 - - - -      Review of Systems:   Pertinent items are noted in HPI Denies any headaches, blurred vision, fatigue, shortness of breath, chest pain, abdominal pain, bowel movements, urination, or intercourse unless otherwise stated above.  Pertinent History Reviewed:  Reviewed past medical,surgical, social and family history.  Reviewed problem list, medications and allergies. Physical Assessment:   Vitals:   07/11/21 1043  BP: 134/75  Pulse: 87  Weight: 209 lb 6.4 oz (95 kg)  Height: 5' (1.524 m)  Body mass index is 40.9 kg/m.        Physical Examination:   General appearance - well appearing, and in no distress  Mental status - alert, oriented to person, place, and time  Psych:  She has a normal mood and affect  Skin - warm and dry, normal color, no suspicious lesions noted  Chest - effort normal, all lung fields clear to auscultation bilaterally  Heart - normal rate and regular rhythm  Abdomen - obese, soft, nontender, nondistended, no masses or organomegaly  Pelvic - VULVA: normal appearing vulva with no masses, tenderness or lesions  VAGINA: normal  appearing vagina with normal color and discharge, no lesions  CERVIX: normal appearing cervix without discharge or lesions, no CMT  Thin prep pap is done with HR HPV cotesting  Bimanual exam limited due to body habitus/pannus, no abnormalities appreciated  Extremities:  No swelling or varicosities noted  Chaperone: Celene Squibb     Assessment & Plan:  1) Cervical cancer screening -pap collected  Other preventive screening completed by PCP  Meds: No orders of the defined types were placed in this encounter.   Follow-up: Return in about 1 year (around 07/11/2022) for Annual.   Janyth Pupa, DO Attending [place], [place] for [facility], [place]

## 2021-07-15 ENCOUNTER — Ambulatory Visit (INDEPENDENT_AMBULATORY_CARE_PROVIDER_SITE_OTHER): Payer: 59

## 2021-07-15 ENCOUNTER — Other Ambulatory Visit: Payer: Self-pay

## 2021-07-15 DIAGNOSIS — Z0000 Encounter for general adult medical examination without abnormal findings: Secondary | ICD-10-CM

## 2021-07-15 NOTE — Progress Notes (Signed)
Subjective:   Christine Weaver is a 55 y.o. female who presents for an Initial Medicare Annual Wellness Visit. I connected with  [name] on 07/15/21 by a audio enabled telemedicine application and verified that I am speaking with the correct person using two identifiers.   I discussed the limitations of evaluation and management by telemedicine. The patient expressed understanding and agreed to proceed.   Location of patient:Home  Location of Provider:Office  Persons participating in virtual visit: Christine Weaver (patient) and Lukisha Procida,CMA Review of Systems    Defer to PCP       Objective:    Today's Vitals   07/15/21 1359  PainSc: 6    There is no height or weight on file to calculate BMI.  Advanced Directives 07/15/2021 12/10/2020 12/09/2020 06/19/2018  Does Patient Have a Medical Advance Directive? No No No No    Current Medications (verified) Outpatient Encounter Medications as of 07/15/2021  Medication Sig   albuterol (PROVENTIL) (2.5 MG/3ML) 0.083% nebulizer solution Take 2.5 mg by nebulization every 4 (four) hours as needed for wheezing or shortness of breath.   albuterol (VENTOLIN HFA) 108 (90 Base) MCG/ACT inhaler Inhale 2 puffs into the lungs every 4 (four) hours as needed for wheezing or shortness of breath.   amLODipine (NORVASC) 10 MG tablet Take 1 tablet (10 mg total) by mouth daily.   atorvastatin (LIPITOR) 80 MG tablet Take 1 tablet (80 mg total) by mouth daily.   BD PEN NEEDLE NANO 2ND GEN 32G X 4 MM MISC USE 1 PEN NEEDLE FOR 6 INJECTIONS DAILY FOR DIABETES E11.9   budesonide-formoterol (SYMBICORT) 80-4.5 MCG/ACT inhaler Take 2 puffs first thing in am and then another 2 puffs about 12 hours later.   Continuous Blood Gluc Receiver (FREESTYLE LIBRE 2 READER) DEVI As directed   famotidine (PEPCID) 20 MG tablet One after supper   fluticasone (FLONASE) 50 MCG/ACT nasal spray Place into both nostrils daily.   furosemide (LASIX) 20 MG tablet TAKE 1 TABLET BY MOUTH  TWICE A DAY   glucose blood (ACCU-CHEK AVIVA PLUS) test strip TEST BLOOD SUGAR ONE TO TWO TIMES A DAY as directed   HYDROcodone-acetaminophen (NORCO/VICODIN) 5-325 MG tablet Take 1 tablet by mouth every 12 (twelve) hours as needed.   hydroxypropyl methylcellulose / hypromellose (ISOPTO TEARS / GONIOVISC) 2.5 % ophthalmic solution Place 1 drop into both eyes 3 (three) times daily.   ibuprofen (ADVIL,MOTRIN) 800 MG tablet Take 800 mg by mouth 2 (two) times daily.   insulin glargine (LANTUS) 100 UNIT/ML injection Inject 80 Units into the skin at bedtime.   insulin lispro (HUMALOG) 100 UNIT/ML KwikPen Inject 12-18 Units into the skin 3 (three) times daily. INJECT 10-16 UNITS INTO THE SKIN 3 TIMES A DAY BEFORE MEALS .   liraglutide (VICTOZA) 18 MG/3ML SOPN Inject 1.8 mg into the skin daily.    lisinopril (ZESTRIL) 30 MG tablet Take 30 mg by mouth daily.   metFORMIN (GLUCOPHAGE) 1000 MG tablet Take 1,000 mg by mouth 2 (two) times daily with a meal.    nystatin cream (MYCOSTATIN) Apply 1 application topically 2 (two) times daily.   omeprazole (PRILOSEC) 40 MG capsule Take 1 capsule (40 mg total) by mouth daily.   SSD 1 % cream Apply 1 application topically daily. For rash   Vitamin D, Ergocalciferol, (DRISDOL) 1.25 MG (50000 UNIT) CAPS capsule TAKE 1 CAPSULE (50,000 UNITS TOTAL) BY MOUTH EVERY 7 (SEVEN) DAYS   No facility-administered encounter medications on file as of 07/15/2021.  Allergies (verified) Morphine and related, Other, Oxycodone-acetaminophen, Latex, and Morphine   History: Past Medical History:  Diagnosis Date   Asthma    COPD (chronic obstructive pulmonary disease) ([place])    Hyperlipidemia    Hypertension    Type 2 diabetes mellitus ([place])    Past Surgical History:  Procedure Laterality Date   CESAREAN SECTION     Family History  Problem Relation Age of Onset   Thyroid disease Mother    Hypertension Mother    Heart attack Mother    Stroke Mother    Heart failure Mother     Diabetes Mother    Emphysema Mother    Cancer Mother    Diabetes Father    Heart attack Father    Stroke Father    Kidney disease Father    Heart failure Father    Social History   Socioeconomic History   Marital status: Widowed    Spouse name: Not on file   Number of children: Not on file   Years of education: Not on file   Highest education level: Not on file  Occupational History   Not on file  Tobacco Use   Smoking status: Former    Types: Cigarettes    Quit date: 10/29/2020    Years since quitting: 0.7   Smokeless tobacco: Never   Tobacco comments:    Uses nicotine patches. Has not smoked since new years eve 2021-  06/30/21  Vaping Use   Vaping Use: Never used  Substance and Sexual Activity   Alcohol use: Never   Drug use: Never   Sexual activity: Not on file  Other Topics Concern   Not on file  Social History Narrative   Not on file   Social Determinants of Health   Financial Resource Strain: Low Risk    Difficulty of Paying Living Expenses: Not hard at all  Food Insecurity: No Food Insecurity   Worried About Charity fundraiser in the Last Year: Never true   [place] in the Last Year: Never true  Transportation Needs: No Transportation Needs   Lack of Transportation (Medical): No   Lack of Transportation (Non-Medical): No  Physical Activity: Inactive   Days of Exercise per Week: 0 days   Minutes of Exercise per Session: 0 min  Stress: No Stress Concern Present   Feeling of Stress : Not at all  Social Connections: Socially Isolated   Frequency of Communication with Friends and Family: More than three times a week   Frequency of Social Gatherings with Friends and Family: Never   Attends Religious Services: Never   Marine scientist or Organizations: No   Attends Archivist Meetings: Never   Marital Status: Widowed    Tobacco Counseling Counseling given: Not Answered Tobacco comments: Uses nicotine patches. Has not smoked  since new years eve 2021-  06/30/21   Clinical Intake:  Pre-visit preparation completed: Yes  Pain : 0-10 Pain Score: 6  Pain Type: Chronic pain Pain Location: Other (Comment) (joint pain)        How often do you need to have someone help you when you read instructions, pamphlets, or other written materials from your doctor or pharmacy?: 1 - Never What is the last grade level you completed in school?: COLLEGE  Diabetic?YES Nutrition Risk Assessment:  Has the patient had any N/V/D within the last 2 months?  No  Does the patient have any non-healing wounds?  No  Has the  patient had any unintentional weight loss or weight gain?  No   Diabetes:  Is the patient diabetic?  Yes  If diabetic, was a CBG obtained today?  Yes  Did the patient bring in their glucometer from home?   N/A How often do you monitor your CBG's? 3xs daily.   Financial Strains and Diabetes Management:  Are you having any financial strains with the device, your supplies or your medication? No .  Does the patient want to be seen by Chronic Care Management for management of their diabetes?  No  Would the patient like to be referred to a Nutritionist or for Diabetic Management?  No   Diabetic Exams:  Diabetic Eye Exam: Completed 8/2/202 Diabetic Foot Exam: Completed 01/27/2021    Interpreter Needed?: No  Information entered by :: Jaylynn Siefert J,CMA   Activities of Daily Living In your present state of health, do you have any difficulty performing the following activities: 07/15/2021  Hearing? N  Vision? N  Difficulty concentrating or making decisions? N  Walking or climbing stairs? N  Dressing or bathing? N  Doing errands, shopping? N  Preparing Food and eating ? N  Using the Toilet? N  In the past six months, have you accidently leaked urine? N  Do you have problems with loss of bowel control? N  Managing your Medications? N  Managing your Finances? N  Housekeeping or managing your Housekeeping? N  Some  recent data might be hidden    Patient Care Team: Lindell Spar, MD as PCP - General (Internal Medicine) Satira Sark, MD as PCP - Cardiology (Cardiology)  Indicate any recent Medical Services you may have received from other than [facility] providers in the past year (date may be approximate).     Assessment:   This is a routine wellness examination for Noe.  Hearing/Vision screen No results found.  Dietary issues and exercise activities discussed: Current Exercise Habits: The patient does not participate in regular exercise at present   Goals Addressed   None    Depression Screen PHQ 2/9 Scores 07/15/2021 07/11/2021 06/02/2021 02/08/2021 02/02/2021 01/27/2021 12/16/2020  PHQ - 2 Score 0 0 0 0 0 0 0  PHQ- 9 Score 0 0 - - - - -    Fall Risk Fall Risk  07/15/2021 06/02/2021 02/08/2021 02/02/2021 01/27/2021  Falls in the past year? 0 0 0 0 0  Number falls in past yr: 0 0 0 0 0  Injury with Fall? 0 0 0 0 0  Risk for fall due to : No Fall Risks No Fall Risks No Fall Risks No Fall Risks No Fall Risks  Follow up Falls evaluation completed Falls evaluation completed Falls evaluation completed Falls evaluation completed Falls evaluation completed    FALL RISK PREVENTION PERTAINING TO THE HOME:  Any stairs in or around the home? Yes  If so, are there any without handrails? No  Home free of loose throw rugs in walkways, pet beds, electrical cords, etc? Yes  Adequate lighting in your home to reduce risk of falls? Yes   ASSISTIVE DEVICES UTILIZED TO PREVENT FALLS:  Life alert? No  Use of a cane, walker or w/c? No  Grab bars in the bathroom? No  Shower chair or bench in shower? No  Elevated toilet seat or a handicapped toilet? No   TIMED UP AND GO:  Was the test performed?  N/A .  Length of time to ambulate 10 feet: N/A sec.     Cognitive Function:  6CIT Screen 07/15/2021  What Year? 0 points  What month? 0 points  What time? 0 points  Count back from 20 0 points  Months  in reverse 0 points  Repeat phrase 0 points  Total Score 0    Immunizations Immunization History  Administered Date(s) Administered   Moderna Sars-Covid-2 Vaccination 12/11/2019, 11/13/2020   Tdap 03/13/2018    TDAP status: Up to date  Flu Vaccine status: Due, Education has been provided regarding the importance of this vaccine. Advised may receive this vaccine at local pharmacy or Health Dept. Aware to provide a copy of the vaccination record if obtained from local pharmacy or Health Dept. Verbalized acceptance and understanding.    Covid-19 vaccine status: Information provided on how to obtain vaccines.   Qualifies for Shingles Vaccine? Yes   Zostavax completed No   Shingrix Completed?: No.    Education has been provided regarding the importance of this vaccine. Patient has been advised to call insurance company to determine out of pocket expense if they have not yet received this vaccine. Advised may also receive vaccine at local pharmacy or Health Dept. Verbalized acceptance and understanding.  Screening Tests Health Maintenance  Topic Date Due   URINE MICROALBUMIN  Never done   Zoster Vaccines- Shingrix (1 of 2) Never done   PAP SMEAR-Modifier  Never done   COLONOSCOPY (Pts 45-15yr Insurance coverage will need to be confirmed)  Never done   COVID-19 Vaccine (3 - Moderna risk series) 12/11/2020   INFLUENZA VACCINE  Never done   HEMOGLOBIN A1C  12/27/2021   FOOT EXAM  01/27/2022   OPHTHALMOLOGY EXAM  05/31/2022   MAMMOGRAM  06/21/2023   TETANUS/TDAP  03/13/2028   Hepatitis C Screening  Completed   HIV Screening  Completed   HPV VACCINES  Aged Out    Health Maintenance  Health Maintenance Due  Topic Date Due   URINE MICROALBUMIN  Never done   Zoster Vaccines- Shingrix (1 of 2) Never done   PAP SMEAR-Modifier  Never done   COLONOSCOPY (Pts 45-463yrInsurance coverage will need to be confirmed)  Never done   COVID-19 Vaccine (3 - Moderna risk series) 12/11/2020    INFLUENZA VACCINE  Never done    Colorectal cancer screening: Referral to GI placed 10/13/2021. Pt aware the office will call re: appt. Patient has appointment scheduled.   Mammogram status: Completed 06/20/2021. Repeat every year  Lung Cancer Screening: (Low Dose CT Chest recommended if Age 53-80ears, 30 pack-year currently smoking OR have quit w/in 15years.) does qualify.   Lung Cancer Screening Referral: NO  Additional Screening:  Hepatitis C Screening: does qualify; Completed 01/27/2021  Vision Screening: Recommended annual ophthalmology exams for early detection of glaucoma and other disorders of the eye. Is the patient up to date with their annual eye exam?  Yes  Who is the provider or what is the name of the office in which the patient attends annual eye exams? [facility]  If pt is not established with a provider, would they like to be referred to a provider to establish care? No .   Dental Screening: Recommended annual dental exams for proper oral hygiene  Community Resource Referral / Chronic Care Management: CRR required this visit?  No   CCM required this visit?  No      Plan:     I have personally reviewed and noted the following in the patient's chart:   Medical and social history Use of alcohol, tobacco or illicit  drugs  Current medications and supplements including opioid prescriptions. Patient is currently taking opioid prescriptions. Information provided to patient regarding non-opioid alternatives. Patient advised to discuss non-opioid treatment plan with their provider. Functional ability and status Nutritional status Physical activity Advanced directives List of other physicians Hospitalizations, surgeries, and ER visits in previous 12 months Vitals Screenings to include cognitive, depression, and falls Referrals and appointments  In addition, I have reviewed and discussed with patient certain preventive protocols, quality metrics, and best  practice recommendations. A written personalized care plan for preventive services as well as general preventive health recommendations were provided to patient.     Edgar Frisk, [facility]   07/15/2021   Nurse Notes: Non Face to Face 30 minute visit   Ms. Giannotti , Thank you for taking time to come for your Medicare Wellness Visit. I appreciate your ongoing commitment to your health goals. Please review the following plan we discussed and let me know if I can assist you in the future.   These are the goals we discussed:  Goals   None     This is a list of the screening recommended for you and due dates:  Health Maintenance  Topic Date Due   Zoster (Shingles) Vaccine (1 of 2) Never done   Pap Smear  Never done   Colon Cancer Screening  Never done   COVID-19 Vaccine (3 - Moderna risk series) 12/11/2020   Flu Shot  Never done   Hemoglobin A1C  12/27/2021   Complete foot exam   01/27/2022   Eye exam for diabetics  05/31/2022   Mammogram  06/21/2023   Tetanus Vaccine  03/13/2028   Hepatitis C Screening: USPSTF Recommendation to screen - Ages 18-79 yo.  Completed   HIV Screening  Completed   HPV Vaccine  Aged Out

## 2021-07-15 NOTE — Patient Instructions (Signed)
Health Maintenance, Female Adopting a healthy lifestyle and getting preventive care are important in promoting health and wellness. Ask your health care provider about: The right schedule for you to have regular tests and exams. Things you can do on your own to prevent diseases and keep yourself healthy. What should I know about diet, weight, and exercise? Eat a healthy diet  Eat a diet that includes plenty of vegetables, fruits, low-fat dairy products, and lean protein. Do not eat a lot of foods that are high in solid fats, added sugars, or sodium. Maintain a healthy weight Body mass index (BMI) is used to identify weight problems. It estimates body fat based on height and weight. Your health care provider can help determine your BMI and help you achieve or maintain a healthy weight. Get regular exercise Get regular exercise. This is one of the most important things you can do for your health. Most adults should: Exercise for at least 150 minutes each week. The exercise should increase your heart rate and make you sweat (moderate-intensity exercise). Do strengthening exercises at least twice a week. This is in addition to the moderate-intensity exercise. Spend less time sitting. Even light physical activity can be beneficial. Watch cholesterol and blood lipids Have your blood tested for lipids and cholesterol at 53 years of age, then have this test every 5 years. Have your cholesterol levels checked more often if: Your lipid or cholesterol levels are high. You are older than 53 years of age. You are at high risk for heart disease. What should I know about cancer screening? Depending on your health history and family history, you may need to have cancer screening at various ages. This may include screening for: Breast cancer. Cervical cancer. Colorectal cancer. Skin cancer. Lung cancer. What should I know about heart disease, diabetes, and high blood pressure? Blood pressure and heart  disease High blood pressure causes heart disease and increases the risk of stroke. This is more likely to develop in people who have high blood pressure readings, are of African descent, or are overweight. Have your blood pressure checked: Every 3-5 years if you are 18-39 years of age. Every year if you are 40 years old or older. Diabetes Have regular diabetes screenings. This checks your fasting blood sugar level. Have the screening done: Once every three years after age 40 if you are at a normal weight and have a low risk for diabetes. More often and at a younger age if you are overweight or have a high risk for diabetes. What should I know about preventing infection? Hepatitis B If you have a higher risk for hepatitis B, you should be screened for this virus. Talk with your health care provider to find out if you are at risk for hepatitis B infection. Hepatitis C Testing is recommended for: Everyone born from 1945 through 1965. Anyone with known risk factors for hepatitis C. Sexually transmitted infections (STIs) Get screened for STIs, including gonorrhea and chlamydia, if: You are sexually active and are younger than 53 years of age. You are older than 53 years of age and your health care provider tells you that you are at risk for this type of infection. Your sexual activity has changed since you were last screened, and you are at increased risk for chlamydia or gonorrhea. Ask your health care provider if you are at risk. Ask your health care provider about whether you are at high risk for HIV. Your health care provider may recommend a prescription medicine   to help prevent HIV infection. If you choose to take medicine to prevent HIV, you should first get tested for HIV. You should then be tested every 3 months for as long as you are taking the medicine. Pregnancy If you are about to stop having your period (premenopausal) and you may become pregnant, seek counseling before you get  pregnant. Take 400 to 800 micrograms (mcg) of folic acid every day if you become pregnant. Ask for birth control (contraception) if you want to prevent pregnancy. Osteoporosis and menopause Osteoporosis is a disease in which the bones lose minerals and strength with aging. This can result in bone fractures. If you are 65 years old or older, or if you are at risk for osteoporosis and fractures, ask your health care provider if you should: Be screened for bone loss. Take a calcium or vitamin D supplement to lower your risk of fractures. Be given hormone replacement therapy (HRT) to treat symptoms of menopause. Follow these instructions at home: Lifestyle Do not use any products that contain nicotine or tobacco, such as cigarettes, e-cigarettes, and chewing tobacco. If you need help quitting, ask your health care provider. Do not use street drugs. Do not share needles. Ask your health care provider for help if you need support or information about quitting drugs. Alcohol use Do not drink alcohol if: Your health care provider tells you not to drink. You are pregnant, may be pregnant, or are planning to become pregnant. If you drink alcohol: Limit how much you use to 0-1 drink a day. Limit intake if you are breastfeeding. Be aware of how much alcohol is in your drink. In the [place] one drink equals one 12 oz bottle of beer (355 mL), one 5 oz glass of wine (148 mL), or one 1 oz glass of hard liquor (44 mL). General instructions Schedule regular health, dental, and eye exams. Stay current with your vaccines. Tell your health care provider if: You often feel depressed. You have ever been abused or do not feel safe at home. Summary Adopting a healthy lifestyle and getting preventive care are important in promoting health and wellness. Follow your health care provider's instructions about healthy diet, exercising, and getting tested or screened for diseases. Follow your health care provider's  instructions on monitoring your cholesterol and blood pressure. This information is not intended to replace advice given to you by your health care provider. Make sure you discuss any questions you have with your health care provider. Document Revised: 12/24/2020 Document Reviewed: 10/09/2018 Elsevier Patient Education  2022 Elsevier Inc.  

## 2021-07-18 LAB — CYTOLOGY - PAP
Adequacy: ABSENT
Chlamydia: NEGATIVE
Comment: NEGATIVE
Comment: NEGATIVE
Comment: NORMAL
Diagnosis: NEGATIVE
High risk HPV: NEGATIVE
Neisseria Gonorrhea: NEGATIVE

## 2021-07-25 ENCOUNTER — Other Ambulatory Visit: Payer: Self-pay | Admitting: Internal Medicine

## 2021-07-25 DIAGNOSIS — E7849 Other hyperlipidemia: Secondary | ICD-10-CM

## 2021-07-25 DIAGNOSIS — K219 Gastro-esophageal reflux disease without esophagitis: Secondary | ICD-10-CM

## 2021-08-08 ENCOUNTER — Telehealth: Payer: Self-pay

## 2021-08-08 ENCOUNTER — Other Ambulatory Visit: Payer: Self-pay | Admitting: Internal Medicine

## 2021-08-08 DIAGNOSIS — I10 Essential (primary) hypertension: Secondary | ICD-10-CM

## 2021-08-08 DIAGNOSIS — E119 Type 2 diabetes mellitus without complications: Secondary | ICD-10-CM

## 2021-08-08 DIAGNOSIS — Z794 Long term (current) use of insulin: Secondary | ICD-10-CM

## 2021-08-08 MED ORDER — LIRAGLUTIDE 18 MG/3ML ~~LOC~~ SOPN: 1.8000 mg | PEN_INJECTOR | Freq: Every day | SUBCUTANEOUS | 1 refills | Status: DC

## 2021-08-08 NOTE — Telephone Encounter (Signed)
Pt is asking for a refill on her Victoza. [facility] [address], [place]

## 2021-08-08 NOTE — Telephone Encounter (Signed)
Rx sent 

## 2021-08-24 ENCOUNTER — Other Ambulatory Visit: Payer: Self-pay | Admitting: "Endocrinology

## 2021-08-24 DIAGNOSIS — E119 Type 2 diabetes mellitus without complications: Secondary | ICD-10-CM

## 2021-08-30 ENCOUNTER — Encounter: Payer: Self-pay | Admitting: Internal Medicine

## 2021-08-30 ENCOUNTER — Ambulatory Visit (INDEPENDENT_AMBULATORY_CARE_PROVIDER_SITE_OTHER): Payer: 59 | Admitting: Internal Medicine

## 2021-08-30 ENCOUNTER — Other Ambulatory Visit: Payer: Self-pay

## 2021-08-30 ENCOUNTER — Telehealth: Payer: Self-pay

## 2021-08-30 VITALS — BP 134/88 | HR 88 | Temp 98.7°F | Ht 60.0 in | Wt 212.0 lb

## 2021-08-30 DIAGNOSIS — J449 Chronic obstructive pulmonary disease, unspecified: Secondary | ICD-10-CM | POA: Diagnosis not present

## 2021-08-30 DIAGNOSIS — J9611 Chronic respiratory failure with hypoxia: Secondary | ICD-10-CM

## 2021-08-30 DIAGNOSIS — I10 Essential (primary) hypertension: Secondary | ICD-10-CM

## 2021-08-30 DIAGNOSIS — F17200 Nicotine dependence, unspecified, uncomplicated: Secondary | ICD-10-CM

## 2021-08-30 MED ORDER — IRBESARTAN 75 MG PO TABS: 75.0000 mg | ORAL_TABLET | Freq: Every day | ORAL | 11 refills | Status: DC

## 2021-08-30 NOTE — Patient Instructions (Addendum)
We will have our lung screening nurse practioner call you to set  up follow up CT not due til Feb 2023  Make sure you check your oxygen saturation  at your highest level of activity  to be sure it stays over 90% and adjust  02 flow upward to maintain this level if needed but remember to turn it back to previous settings when you stop (to conserve your supply).    Keep your appt for your PFTs   Please schedule a follow up visit in 6  months but call sooner if needed  Late add not on amlodipine or lisinopril so started avapro 75 mg daily

## 2021-08-30 NOTE — Telephone Encounter (Signed)
Patient called (per todays OV instructions) and said she is not taking lisinopril or amlodipine. Went through [employer] with patient over the phone and marked medications correctly according to patient.   Dr. Melvyn Novas was planning to prescribe a medication if pt was not taking lisinopril.   Dr. Melvyn Novas please advise.

## 2021-08-30 NOTE — Telephone Encounter (Signed)
See ov addendum

## 2021-08-30 NOTE — Progress Notes (Signed)
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  Quit work 2004 at 61   After moving to [facility] 2012 saw [employer] doctor feeling sluggish and dx with asthma > meds didn't help much then started doe which seemed better with alb neb then added advair around 2014/15 then 02       History of Present Illness  06/30/2021  Pulmonary/ 1st office eval/ Jakayden Cancio / [place] [facility]  Chief Complaint  Patient presents with   Follow-up    Pt. Is on 2L O2 when sleeping and out running errands but tries not to wear it in the house unless she needs it. Coughing but not normally coughing up mucus.   Dyspnea:  foodlion 50% using 02 2lpm/  Cough: always coughing some very hoarse x years >minimally productive  Sleep: 45 degrees with pillows  SABA use: no longer needing since 02  02 :  2lpm at bedtime and prn daytime  Intol of dpi  Rec Omeprazole 40 mg Take  30-60 min before first meal of the day and Pepcid (famotidine)  20 mg after supper until return to office - this is the best way to tell whether stomach acid is contributing to your problem.   GERD rx Stop advair and start symbicort 80 Take 2 puffs first thing in am and then another 2 puffs about 12 hours later.  Work on inhaler technique: Marland Kitchen  Make sure you check your oxygen saturation  at your highest level of activity   Please schedule a follow up office visit in 6-8  weeks, call sooner if needed with pfts on return      08/30/2021  f/u ov/[place] [facility]/Hashim Eichhorst re:  maint on symbicort 80 2bid     Chief Complaint  Patient presents with   Follow-up    2 lpm all of the time. Persistent dry cough. Feels inhaler is helping.   Dyspnea:   push mower x 20 min s 02  says after stopped 89% but not checking  while exerting  Cough: dry  Sleeping: flat bed 45  degrees SABA use: minimal  02: 2lpm hs and prn  Covid status: vax 3  Lung cancer screening: referred    No obvious day to day or daytime variability or assoc excess/ purulent sputum or mucus plugs or hemoptysis or cp or chest tightness, subjective wheeze or overt sinus or hb symptoms.   Sleeping as above without nocturnal  or early am exacerbation  of respiratory  c/o's or need for noct saba. Also denies any obvious fluctuation of symptoms with weather or environmental changes or other aggravating or alleviating factors except as outlined above   No unusual exposure hx or h/o childhood pna/ asthma or knowledge of premature birth.  Current Allergies, Complete Past Medical History, Past Surgical History, Family History, and Social History were reviewed in [employer] record.  ROS  The following are not active complaints unless bolded Hoarseness, sore throat, dysphagia, dental problems, itching, sneezing,  nasal congestion or discharge of excess mucus or purulent secretions, ear ache,   fever, chills, sweats, unintended wt loss or wt gain, classically pleuritic or exertional cp,  orthopnea pnd or arm/hand swelling  or leg swelling, presyncope, palpitations, abdominal pain, anorexia, nausea, vomiting, diarrhea  or change in bowel habits  or change in bladder habits, change in stools or change in urine, dysuria, hematuria,  rash, arthralgias, visual complaints, headache, numbness, weakness or ataxia or problems with walking or coordination,  change in mood or  memory.        Current Meds  Medication Sig   albuterol (PROVENTIL) (2.5 MG/3ML) 0.083% nebulizer solution Take 2.5 mg by nebulization every 4 (four) hours as needed for wheezing or shortness of breath.   albuterol (VENTOLIN HFA) 108 (90 Base) MCG/ACT inhaler Inhale 2 puffs into the lungs every 4 (four) hours as needed for wheezing or shortness of breath.   amLODipine (NORVASC) 10 MG tablet TAKE 1 TABLET BY MOUTH EVERY  DAY   atorvastatin (LIPITOR) 80 MG tablet TAKE 1 TABLET BY MOUTH EVERY DAY   BD PEN NEEDLE NANO 2ND GEN 32G X 4 MM MISC USE 1 PEN NEEDLE FOR 6 INJECTIONS DAILY FOR DIABETES E11.9   budesonide-formoterol (SYMBICORT) 80-4.5 MCG/ACT inhaler Take 2 puffs first thing in am and then another 2 puffs about 12 hours later.   Continuous Blood Gluc Receiver (FREESTYLE LIBRE 2 READER) DEVI As directed   famotidine (PEPCID) 20 MG tablet One after supper   fluticasone (FLONASE) 50 MCG/ACT nasal spray Place into both nostrils daily.   furosemide (LASIX) 20 MG tablet TAKE 1 TABLET BY MOUTH TWICE A DAY   glucose blood (ACCU-CHEK AVIVA PLUS) test strip TEST BLOOD SUGAR ONE TO TWO TIMES A DAY as directed   HUMALOG KWIKPEN 100 UNIT/ML KwikPen INJECT 10-16 UNITS INTO THE SKIN 3 TIMES A DAY BEFORE MEALS .   HYDROcodone-acetaminophen (NORCO/VICODIN) 5-325 MG tablet Take 1 tablet by mouth every 12 (twelve) hours as needed.   hydroxypropyl methylcellulose / hypromellose (ISOPTO TEARS / GONIOVISC) 2.5 % ophthalmic solution Place 1 drop into both eyes 3 (three) times daily.   ibuprofen (ADVIL,MOTRIN) 800 MG tablet Take 800 mg by mouth 2 (two) times daily.   insulin glargine (LANTUS) 100 UNIT/ML injection Inject 80 Units into the skin at bedtime.   liraglutide (VICTOZA) 18 MG/3ML SOPN Inject 1.8 mg into the skin daily.   metFORMIN (GLUCOPHAGE) 1000 MG tablet Take 1,000 mg by mouth 2 (two) times daily with a meal.    nystatin cream (MYCOSTATIN) Apply 1 application topically 2 (two) times daily.   omeprazole (PRILOSEC) 40 MG capsule TAKE 1 CAPSULE (40 MG TOTAL) BY MOUTH DAILY.   SSD 1 % cream Apply 1 application topically daily. For rash   Vitamin D, Ergocalciferol, (DRISDOL) 1.25 MG (50000 UNIT) CAPS capsule TAKE 1 CAPSULE (50,000 UNITS TOTAL) BY MOUTH EVERY 7 (SEVEN) DAYS   [DISCONTINUED] lisinopril (ZESTRIL) 30 MG tablet Take 30 mg by mouth daily.           Past Medical History:  Diagnosis Date   Asthma    COPD  (chronic obstructive pulmonary disease) (HCC)    Hyperlipidemia    Hypertension    Type 2 diabetes mellitus (HCC)         Objective:     Wt Readings from Last 3 Encounters:  08/30/21 212 lb (96.2 kg)  07/11/21 209 lb 6.4 oz (95 kg)  06/30/21 210 lb 1.6 oz (95.3 kg)      Vital signs reviewed  08/30/2021  - Note at rest 02 sats  97% on RA  and on arrival on 2lpm  General appearance:   amb obese hoarse wf nad    HEENT : pt wearing mask not removed for exam due to covid - 19 concerns.  NECK :  without JVD/Nodes/TM/ nl carotid upstrokes bilaterally   LUNGS: no acc muscle use,  Min barrel  contour chest wall with bilateral  slightly decreased bs s audible wheeze and  without cough on insp or exp maneuvers and min  Hyperresonant  to  percussion bilaterally     CV:  RRR  no s3 or murmur or increase in P2, and no edema   ABD:  soft and nontender with pos end  insp Hoover's  in the supine position. No bruits or organomegaly appreciated, bowel sounds nl  MS:   Nl gait/  ext warm without deformities, calf tenderness, cyanosis or clubbing No obvious joint restrictions   SKIN: warm and dry without lesions    NEURO:  alert, approp, nl sensorium with  no motor or cerebellar deficits apparent.           Assessment

## 2021-08-31 ENCOUNTER — Encounter: Payer: Self-pay | Admitting: Internal Medicine

## 2021-08-31 NOTE — Assessment & Plan Note (Addendum)
Echo 12/09/20  There is mild left ventricular hypertrophy.  Left ventricular diastolic parameters  are indeterminate. Elevated left atrial pressure. -D/c acei 06/30/2021 due to hoarseness /cough   Did not understand meds last ov but today clearly not taking any bp meds and bp elevated  and so rec start avapro 75 mg daily and f/u with PCP with all meds in hand using a trust but verify approach to confirm accurate Medication  Reconciliation The principal here is that until we are certain that the  patients are doing what we've asked, it makes no sense to ask them to do more.   >>> f/u here in 6 m, call sooner prn

## 2021-08-31 NOTE — Assessment & Plan Note (Addendum)
Quit smoking 09/2020  - try off acei 06/30/2021  - change advair to symb80 2bid and max rx for gerd  - alpha one  06/30/21    MM   Level  142 - Allergy profile 06/30/21  >  Eos 0.1 /  IgE  124  Improving AB ? How much copd > rec no change rx = symbiocrt 80 2bid   check pfts   Low-dose CT lung cancer screening is recommended for patients who are 76-53 years of age with a 20+ pack-year history of smoking, and who are currently smoking or quit <=15 years ago. >>> she is a candidate for screening for the next 14 years per present guidelines > referred for shared decision making  Discussed in detail all the  indications, usual  risks and alternatives  relative to the benefits with patient who agrees to proceed with w/u as outlined.

## 2021-08-31 NOTE — Assessment & Plan Note (Signed)
sats 97% at rest 08/30/2021    As of 08/30/2021  rec 2lpm hs and titrate with exertion, clearly not needed at rest  Advised: Make sure you check your oxygen saturation  at your highest level of activity  to be sure it stays over 90% and adjust  02 flow upward to maintain this level if needed but remember to turn it back to previous settings when you stop (to conserve your supply).           Each maintenance medication was reviewed in detail including emphasizing most importantly the difference between maintenance and prns and under what circumstances the prns are to be triggered using an action plan format where appropriate.  Total time for H and P, chart review, counseling, reviewing hfa/02 device(s) and generating customized AVS unique to this office visit / same day charting  > 30 min

## 2021-09-18 ENCOUNTER — Other Ambulatory Visit: Payer: Self-pay | Admitting: "Endocrinology

## 2021-09-18 DIAGNOSIS — Z794 Long term (current) use of insulin: Secondary | ICD-10-CM

## 2021-09-18 DIAGNOSIS — E119 Type 2 diabetes mellitus without complications: Secondary | ICD-10-CM

## 2021-10-03 ENCOUNTER — Telehealth: Payer: Self-pay | Admitting: "Endocrinology

## 2021-10-03 ENCOUNTER — Other Ambulatory Visit: Payer: Self-pay | Admitting: "Endocrinology

## 2021-10-03 ENCOUNTER — Other Ambulatory Visit: Payer: Self-pay

## 2021-10-03 ENCOUNTER — Other Ambulatory Visit: Payer: Self-pay | Admitting: Nurse Practitioner

## 2021-10-03 MED ORDER — INSULIN GLARGINE 100 UNIT/ML SOLOSTAR PEN: 70.0000 [IU] | PEN_INJECTOR | Freq: Every day | SUBCUTANEOUS | 1 refills | Status: DC

## 2021-10-03 NOTE — Telephone Encounter (Signed)
Pt requesting a refill on her Lantus. Patient uses [facility] [place]

## 2021-10-03 NOTE — Telephone Encounter (Signed)
Medication has been sent to the pharmacy requested. °

## 2021-10-06 ENCOUNTER — Other Ambulatory Visit: Payer: Self-pay

## 2021-10-06 ENCOUNTER — Ambulatory Visit (INDEPENDENT_AMBULATORY_CARE_PROVIDER_SITE_OTHER): Payer: 59 | Admitting: Internal Medicine

## 2021-10-06 ENCOUNTER — Encounter: Payer: Self-pay | Admitting: Internal Medicine

## 2021-10-06 VITALS — BP 136/78 | HR 98 | Resp 18 | Ht 60.0 in | Wt 207.0 lb

## 2021-10-06 DIAGNOSIS — I5032 Chronic diastolic (congestive) heart failure: Secondary | ICD-10-CM | POA: Diagnosis not present

## 2021-10-06 DIAGNOSIS — I10 Essential (primary) hypertension: Secondary | ICD-10-CM | POA: Diagnosis not present

## 2021-10-06 DIAGNOSIS — E782 Mixed hyperlipidemia: Secondary | ICD-10-CM

## 2021-10-06 DIAGNOSIS — J449 Chronic obstructive pulmonary disease, unspecified: Secondary | ICD-10-CM

## 2021-10-06 DIAGNOSIS — E559 Vitamin D deficiency, unspecified: Secondary | ICD-10-CM

## 2021-10-06 DIAGNOSIS — M67441 Ganglion, right hand: Secondary | ICD-10-CM

## 2021-10-06 DIAGNOSIS — E119 Type 2 diabetes mellitus without complications: Secondary | ICD-10-CM | POA: Diagnosis not present

## 2021-10-06 DIAGNOSIS — Z794 Long term (current) use of insulin: Secondary | ICD-10-CM

## 2021-10-06 DIAGNOSIS — Z23 Encounter for immunization: Secondary | ICD-10-CM | POA: Diagnosis not present

## 2021-10-06 DIAGNOSIS — Z1329 Encounter for screening for other suspected endocrine disorder: Secondary | ICD-10-CM

## 2021-10-06 NOTE — Progress Notes (Addendum)
Established Patient Office Visit  Subjective:  Patient ID: Christine Weaver, female    DOB: 10-22-1966  Age: 55 y.o. MRN: [id number]  CC:  Chief Complaint  Patient presents with   Follow-up    right middle finger has a knot pt noticed this over thanksgiving both middle fingers lock up on pt     HPI Christine Weaver is a 55 y.o. female with past medical history of COPD, chronic respiratory failure, HTN, HFpEF and type 2 DM who  presents for f/u of her chronic medical conditions.  HTN: BP is well-controlled. Takes medications regularly. Patient denies headache, dizziness, chest pain, dyspnea or palpitations.   HFpEF: She denies orthopnea or PND. Takes Lasix regularly.   COPD: On home O2 - 2 lpm PRN now, she is benefiting from oxygen usage during episodes of dyspnea/hypoxia. Uses Symbicort regularly. Denies any need to use Albuterol recently. Denies dyspnea or wheezing currently.  Type II DM with HLD: Her HbA1C has slightly improved to 8.2.  She has brought her reader of CGM, which shows 7-day average of 145, which is better than 90-day average of around 180.  She has been taking Lantus 80 units with Humalog ISS.  She has been trying to cut down soft drinks, but admits that she has sweets when her kids cook for her.  She is trying to cut down on bread products in past as well.  She received PCV20 and first dose of Shingrix vaccine in the office today.  Past Medical History:  Diagnosis Date   Asthma    COPD (chronic obstructive pulmonary disease) ([place])    Hyperlipidemia    Hypertension    Type 2 diabetes mellitus ([place])     Past Surgical History:  Procedure Laterality Date   CESAREAN SECTION      Family History  Problem Relation Age of Onset   Thyroid disease Mother    Hypertension Mother    Heart attack Mother    Stroke Mother    Heart failure Mother    Diabetes Mother    Emphysema Mother    Cancer Mother    Diabetes Father    Heart attack Father    Stroke Father    Kidney  disease Father    Heart failure Father     Social History   Socioeconomic History   Marital status: Widowed    Spouse name: Not on file   Number of children: Not on file   Years of education: Not on file   Highest education level: Not on file  Occupational History   Not on file  Tobacco Use   Smoking status: Former    Types: Cigarettes    Quit date: 10/29/2020    Years since quitting: 0.9   Smokeless tobacco: Never   Tobacco comments:    Uses nicotine patches. Has not smoked since new years eve 2021-  06/30/21  Vaping Use   Vaping Use: Never used  Substance and Sexual Activity   Alcohol use: Never   Drug use: Never   Sexual activity: Not on file  Other Topics Concern   Not on file  Social History Narrative   Not on file   Social Determinants of Health   Financial Resource Strain: Low Risk    Difficulty of Paying Living Expenses: Not hard at all  Food Insecurity: No Food Insecurity   Worried About Charity fundraiser in the Last Year: Never true   [place] in the Last Year: Never  true  Transportation Needs: No Transportation Needs   Lack of Transportation (Medical): No   Lack of Transportation (Non-Medical): No  Physical Activity: Inactive   Days of Exercise per Week: 0 days   Minutes of Exercise per Session: 0 min  Stress: No Stress Concern Present   Feeling of Stress : Not at all  Social Connections: Socially Isolated   Frequency of Communication with Friends and Family: More than three times a week   Frequency of Social Gatherings with Friends and Family: Never   Attends Religious Services: Never   Marine scientist or Organizations: No   Attends Archivist Meetings: Never   Marital Status: Widowed  Human resources officer Violence: Not At Risk   Fear of Current or Ex-Partner: No   Emotionally Abused: No   Physically Abused: No   Sexually Abused: No    Outpatient Medications Prior to Visit  Medication Sig Dispense Refill   albuterol  (PROVENTIL) (2.5 MG/3ML) 0.083% nebulizer solution Take 2.5 mg by nebulization every 4 (four) hours as needed for wheezing or shortness of breath.     albuterol (VENTOLIN HFA) 108 (90 Base) MCG/ACT inhaler Inhale 2 puffs into the lungs every 4 (four) hours as needed for wheezing or shortness of breath. 18 g 5   amLODipine (NORVASC) 10 MG tablet TAKE 1 TABLET BY MOUTH EVERY DAY 90 tablet 1   atorvastatin (LIPITOR) 80 MG tablet TAKE 1 TABLET BY MOUTH EVERY DAY 90 tablet 1   BD PEN NEEDLE NANO 2ND GEN 32G X 4 MM MISC USE 1 PEN NEEDLE FOR 6 INJECTIONS DAILY FOR DIABETES E11.9 200 each 11   budesonide-formoterol (SYMBICORT) 80-4.5 MCG/ACT inhaler Take 2 puffs first thing in am and then another 2 puffs about 12 hours later. 1 each 12   Continuous Blood Gluc Receiver (FREESTYLE LIBRE 2 READER) DEVI As directed 1 each 0   famotidine (PEPCID) 20 MG tablet One after supper 30 tablet 11   fluticasone (FLONASE) 50 MCG/ACT nasal spray Place into both nostrils daily.     furosemide (LASIX) 20 MG tablet TAKE 1 TABLET BY MOUTH TWICE A DAY 180 tablet 3   glucose blood (ACCU-CHEK AVIVA PLUS) test strip      HUMALOG KWIKPEN 100 UNIT/ML KwikPen INJECT 10-16 UNITS INTO THE SKIN 3 TIMES A DAY BEFORE MEALS . 30 mL 1   HYDROcodone-acetaminophen (NORCO/VICODIN) 5-325 MG tablet Take 1 tablet by mouth every 12 (twelve) hours as needed.     hydroxypropyl methylcellulose / hypromellose (ISOPTO TEARS / GONIOVISC) 2.5 % ophthalmic solution Place 1 drop into both eyes 3 (three) times daily. 15 mL 1   ibuprofen (ADVIL,MOTRIN) 800 MG tablet Take 800 mg by mouth 2 (two) times daily.     irbesartan (AVAPRO) 75 MG tablet Take 1 tablet (75 mg total) by mouth daily. 30 tablet 11   LANTUS SOLOSTAR 100 UNIT/ML Solostar Pen INJECT 70 UNITS INTO THE SKIN AT BEDTIME. 15 mL 1   metFORMIN (GLUCOPHAGE) 1000 MG tablet Take 1,000 mg by mouth 2 (two) times daily with a meal.      nystatin cream (MYCOSTATIN) Apply 1 application topically 2 (two)  times daily.     omeprazole (PRILOSEC) 40 MG capsule TAKE 1 CAPSULE (40 MG TOTAL) BY MOUTH DAILY. 90 capsule 1   SSD 1 % cream Apply 1 application topically daily. For rash  0   VICTOZA 18 MG/3ML SOPN INJECT 1.8 MG UNDER THE SKIN ONCE DAILY 9 mL 1   Vitamin D,  Ergocalciferol, (DRISDOL) 1.25 MG (50000 UNIT) CAPS capsule TAKE 1 CAPSULE (50,000 UNITS TOTAL) BY MOUTH EVERY 7 (SEVEN) DAYS 12 capsule 1   No facility-administered medications prior to visit.    Allergies  Allergen Reactions   Morphine And Related    Other Other (See Comments)    Bells Palsy    Oxycodone-Acetaminophen Other (See Comments)    Constipates patient    Latex Itching and Other (See Comments)    Skin "cracks" open    Morphine Itching and Other (See Comments)    ROS Review of Systems  Constitutional:  Negative for chills and fever.  HENT:  Negative for congestion, sinus pressure, sinus pain and sore throat.   Eyes:  Negative for pain and discharge.  Respiratory:  Negative for cough, shortness of breath and wheezing.   Cardiovascular:  Negative for chest pain and palpitations.  Gastrointestinal:  Negative for abdominal pain, constipation, diarrhea, nausea and vomiting.  Endocrine: Negative for polydipsia and polyuria.  Genitourinary:  Negative for dysuria and hematuria.  Musculoskeletal:  Positive for arthralgias and back pain. Negative for neck pain and neck stiffness.  Skin:  Negative for rash.  Neurological:  Negative for dizziness and weakness.  Psychiatric/Behavioral:  Negative for agitation and behavioral problems.      Objective:    Physical Exam Vitals reviewed.  Constitutional:      General: She is not in acute distress.    Appearance: She is not diaphoretic.  HENT:     Head: Normocephalic and atraumatic.     Nose: Nose normal.     Mouth/Throat:     Mouth: Mucous membranes are moist.  Eyes:     General: No scleral icterus.    Extraocular Movements: Extraocular movements intact.   Cardiovascular:     Rate and Rhythm: Normal rate and regular rhythm.     Pulses: Normal pulses.     Heart sounds: Normal heart sounds. No murmur heard. Pulmonary:     Breath sounds: Normal breath sounds. No wheezing or rales.  Abdominal:     Palpations: Abdomen is soft.     Tenderness: There is no abdominal tenderness.  Musculoskeletal:     Cervical back: Neck supple. No tenderness.     Right lower leg: No edema.     Left lower leg: No edema.     Comments: About 0.5 cm cyst over right hand 3rd digit over proximal phalanx  Skin:    General: Skin is warm.     Findings: No rash.  Neurological:     General: No focal deficit present.     Mental Status: She is alert and oriented to person, place, and time.     Sensory: No sensory deficit.     Motor: No weakness.  Psychiatric:        Mood and Affect: Mood normal.        Behavior: Behavior normal.    BP 136/78 (BP Location: Right Arm)   Pulse 98   Resp 18   Ht 5' (1.524 m)   Wt 207 lb (93.9 kg)   SpO2 98%   BMI 40.43 kg/m  Wt Readings from Last 3 Encounters:  10/06/21 207 lb (93.9 kg)  08/30/21 212 lb (96.2 kg)  07/11/21 209 lb 6.4 oz (95 kg)    Lab Results  Component Value Date   TSH 1.730 01/27/2021   Lab Results  Component Value Date   WBC 11.8 (H) 06/30/2021   HGB 13.2 06/30/2021   HCT 41.2 06/30/2021  MCV 81 06/30/2021   PLT 266 06/30/2021   Lab Results  Component Value Date   NA 142 06/30/2021   K 4.4 06/30/2021   CO2 27 06/30/2021   GLUCOSE 165 (H) 06/30/2021   BUN 13 06/30/2021   CREATININE 1.01 (H) 06/30/2021   BILITOT 0.3 11/11/2020   ALKPHOS 116 11/11/2020   AST 10 11/11/2020   ALT 14 11/11/2020   PROT 5.9 (L) 11/11/2020   ALBUMIN 3.5 (L) 11/11/2020   CALCIUM 10.2 06/30/2021   ANIONGAP 6 05/12/2021   EGFR 67 06/30/2021   Lab Results  Component Value Date   CHOL 175 11/11/2020   Lab Results  Component Value Date   HDL 45 11/11/2020   Lab Results  Component Value Date   LDLCALC 102  (H) 11/11/2020   Lab Results  Component Value Date   TRIG 159 (H) 11/11/2020   Lab Results  Component Value Date   CHOLHDL 3.9 11/11/2020   Lab Results  Component Value Date   HGBA1C 8.2 (A) 06/28/2021      Assessment & Plan:   Problem List Items Addressed This Visit       Cardiovascular and Mediastinum   Essential hypertension, benign - Primary    BP Readings from Last 1 Encounters:  10/06/21 136/78  Well-controlled with Amlodipine and Irbesartan now Counseled for compliance with the medications Advised DASH diet and moderate exercise/walking, at least 150 mins/week      Relevant Orders   CBC with Differential/Platelet   (HFpEF) heart failure with preserved ejection fraction (HCC)    Appears euvolemic Continue Lasix Follows up with Cardiologist        Respiratory   COPD GOLD ? /     On Symbicort Uses Albuterol nebs and inhaler PRN On home O2 - 2 lpm PRN        Endocrine   Type 2 diabetes mellitus without complication, with long-term current use of insulin (HCC)    Lab Results  Component Value Date   HGBA1C 8.2 (A) 06/28/2021  Takes Lantus 80 U qHS, Humalog 14-20 U TID according to sliding scale Victoza 1.8 mg QD Metformin 1000 mg BID Follows up with Dr Dorris Fetch On statin and ACEi Referred to Ophthalmology for diabetic eye exam      Relevant Orders   Hemoglobin A1c   CMP14+EGFR     Other   Mixed hyperlipidemia    On Lipitor Check lipid profile      Relevant Orders   Lipid panel   Morbid obesity due to excess calories (HCC)    Diet modification and moderate exercise advised On GLP-1 agonist      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   Follow-up: Return in about 4 months (around 02/04/2022) for DM, HTN and HFpEF.    Lindell Spar, MD

## 2021-10-06 NOTE — Assessment & Plan Note (Signed)
Lab Results  Component Value Date   HGBA1C 8.2 (A) 06/28/2021   Takes Lantus 80 U qHS, Humalog 14-20 U TID according to sliding scale Victoza 1.8 mg QD Metformin 1000 mg BID Follows up with Dr Dorris Fetch On statin and ACEi Referred to Ophthalmology for diabetic eye exam

## 2021-10-06 NOTE — Assessment & Plan Note (Signed)
Diet modification and moderate exercise advised On GLP-1 agonist

## 2021-10-06 NOTE — Patient Instructions (Signed)
Please continue taking medications as prescribed.  Please continue to follow low carb diet and perform moderate exercise/walking at least 150 mins/week. 

## 2021-10-06 NOTE — Assessment & Plan Note (Signed)
Appears euvolemic Continue Lasix Follows up with Cardiologist

## 2021-10-06 NOTE — Assessment & Plan Note (Signed)
Right hand bulge appears to be a small cyst, usually benign, reassured

## 2021-10-06 NOTE — Assessment & Plan Note (Addendum)
BP Readings from Last 1 Encounters:  10/06/21 136/78   Well-controlled with Amlodipine and Irbesartan now Counseled for compliance with the medications Advised DASH diet and moderate exercise/walking, at least 150 mins/week

## 2021-10-06 NOTE — Assessment & Plan Note (Signed)
On Symbicort Uses Albuterol nebs and inhaler PRN On home O2 - 2 lpm PRN 

## 2021-10-06 NOTE — Assessment & Plan Note (Signed)
On Lipitor Check lipid profile 

## 2021-10-13 ENCOUNTER — Other Ambulatory Visit: Payer: Self-pay

## 2021-10-13 ENCOUNTER — Ambulatory Visit (INDEPENDENT_AMBULATORY_CARE_PROVIDER_SITE_OTHER): Payer: 59 | Admitting: Gastroenterology

## 2021-10-13 ENCOUNTER — Encounter (INDEPENDENT_AMBULATORY_CARE_PROVIDER_SITE_OTHER): Payer: Self-pay | Admitting: Gastroenterology

## 2021-10-13 DIAGNOSIS — R195 Other fecal abnormalities: Secondary | ICD-10-CM | POA: Diagnosis not present

## 2021-10-13 NOTE — Progress Notes (Signed)
Maylon Peppers, M.D. Gastroenterology & Hepatology [facility] [address] [place], [place] [address] Primary Care Physician: Lindell Spar, MD [address] [place] [address]  Referring MD: PCP  Chief Complaint: Positive Cologuard  History of Present Illness: Christine Weaver is a 55 y.o. female with Pmh asthma, COPD, hyperlipidemia, hypertension and diabetes, who presents for evaluation of positive Cologuard.  Patient had positive Cologuard on 11/16/2020.  She was referred to our office for further management.  Patient denies having any complaints.  The patient denies having any nausea, vomiting, fever, chills, hematochezia, melena, hematemesis, abdominal distention, abdominal pain, diarrhea, jaundice, pruritus. Has lsot 50 lb after she has made adjustments in her diet to lose weight.   States that it would be difficult for her to have a colonoscopy prior to July 2023 as she does not have any relatives in the area that can drive her after her procedure.  Last Colonoscopy:never  FHx: neg for any gastrointestinal/liver disease, mother uterine cancer Social: former smoking quit a year ago, neg alcohol or illicit drug use Surgical: c-section  Past Medical History: Past Medical History:  Diagnosis Date   Asthma    COPD (chronic obstructive pulmonary disease) ([place])    Hyperlipidemia    Hypertension    Type 2 diabetes mellitus ([place])     Past Surgical History: Past Surgical History:  Procedure Laterality Date   CESAREAN SECTION      Family History: Family History  Problem Relation Age of Onset   Thyroid disease Mother    Hypertension Mother    Heart attack Mother    Stroke Mother    Heart failure Mother    Diabetes Mother    Emphysema Mother    Cancer Mother    Diabetes Father    Heart attack Father    Stroke Father    Kidney disease Father    Heart failure Father     Social History: Social History    Tobacco Use  Smoking Status Former   Types: Cigarettes   Quit date: 10/29/2020   Years since quitting: 0.9  Smokeless Tobacco Never  Tobacco Comments   Uses nicotine patches. Has not smoked since new years eve 2021-  06/30/21   Social History   Substance and Sexual Activity  Alcohol Use Never   Social History   Substance and Sexual Activity  Drug Use Never    Allergies: Allergies  Allergen Reactions   Morphine And Related    Other Other (See Comments)    Bells Palsy    Oxycodone-Acetaminophen Other (See Comments)    Constipates patient    Latex Itching and Other (See Comments)    Skin "cracks" open    Morphine Itching and Other (See Comments)    Medications: Current Outpatient Medications  Medication Sig Dispense Refill   albuterol (PROVENTIL) (2.5 MG/3ML) 0.083% nebulizer solution Take 2.5 mg by nebulization every 4 (four) hours as needed for wheezing or shortness of breath.     albuterol (VENTOLIN HFA) 108 (90 Base) MCG/ACT inhaler Inhale 2 puffs into the lungs every 4 (four) hours as needed for wheezing or shortness of breath. 18 g 5   amLODipine (NORVASC) 10 MG tablet TAKE 1 TABLET BY MOUTH EVERY DAY 90 tablet 1   BD PEN NEEDLE NANO 2ND GEN 32G X 4 MM MISC USE 1 PEN NEEDLE FOR 6 INJECTIONS DAILY FOR DIABETES E11.9 200 each 11   budesonide-formoterol (SYMBICORT) 80-4.5 MCG/ACT inhaler Take 2 puffs  first thing in am and then another 2 puffs about 12 hours later. 1 each 12   Continuous Blood Gluc Receiver (FREESTYLE LIBRE 2 READER) DEVI As directed 1 each 0   famotidine (PEPCID) 20 MG tablet One after supper 30 tablet 11   fluticasone (FLONASE) 50 MCG/ACT nasal spray Place into both nostrils daily.     furosemide (LASIX) 20 MG tablet TAKE 1 TABLET BY MOUTH TWICE A DAY 180 tablet 3   glucose blood (ACCU-CHEK AVIVA PLUS) test strip      HUMALOG KWIKPEN 100 UNIT/ML KwikPen INJECT 10-16 UNITS INTO THE SKIN 3 TIMES A DAY BEFORE MEALS . 30 mL 1    HYDROcodone-acetaminophen (NORCO/VICODIN) 5-325 MG tablet Take 1 tablet by mouth every 12 (twelve) hours as needed.     irbesartan (AVAPRO) 75 MG tablet Take 1 tablet (75 mg total) by mouth daily. 30 tablet 11   LANTUS SOLOSTAR 100 UNIT/ML Solostar Pen INJECT 70 UNITS INTO THE SKIN AT BEDTIME. (Patient taking differently: Inject 80 Units into the skin at bedtime.) 15 mL 1   metFORMIN (GLUCOPHAGE) 1000 MG tablet Take 1,000 mg by mouth 2 (two) times daily with a meal.      nystatin cream (MYCOSTATIN) Apply 1 application topically 2 (two) times daily.     omeprazole (PRILOSEC) 40 MG capsule TAKE 1 CAPSULE (40 MG TOTAL) BY MOUTH DAILY. 90 capsule 1   SSD 1 % cream Apply 1 application topically daily. For rash  0   VICTOZA 18 MG/3ML SOPN INJECT 1.8 MG UNDER THE SKIN ONCE DAILY 9 mL 1   No current facility-administered medications for this visit.    Review of Systems: GENERAL: negative for malaise, night sweats HEENT: No changes in hearing or vision, no nose bleeds or other nasal problems. NECK: Negative for lumps, goiter, pain and significant neck swelling RESPIRATORY: Negative for cough, wheezing CARDIOVASCULAR: Negative for chest pain, leg swelling, palpitations, orthopnea GI: SEE HPI MUSCULOSKELETAL: Negative for joint pain or swelling, back pain, and muscle pain. SKIN: Negative for lesions, rash PSYCH: Negative for sleep disturbance, mood disorder and recent psychosocial stressors. HEMATOLOGY Negative for prolonged bleeding, bruising easily, and swollen nodes. ENDOCRINE: Negative for cold or heat intolerance, polyuria, polydipsia and goiter. NEURO: negative for tremor, gait imbalance, syncope and seizures. The remainder of the review of systems is noncontributory.   Physical Exam: BP 139/80 (BP Location: Left Arm, Patient Position: Sitting, Cuff Size: Large)    Pulse 94    Temp 98.3 F (36.8 C) (Oral)    Ht 5' (1.524 m)    Wt 207 lb (93.9 kg)    BMI 40.43 kg/m  GENERAL: The patient is  AO x3, in no acute distress. Obese. HEENT: Head is normocephalic and atraumatic. EOMI are intact. Mouth is well hydrated and without lesions. NECK: Supple. No masses LUNGS: Clear to auscultation. No presence of rhonchi/wheezing/rales. Adequate chest expansion HEART: RRR, normal s1 and s2. ABDOMEN: Soft, nontender, no guarding, no peritoneal signs, and nondistended. BS +. No masses. EXTREMITIES: Without any cyanosis, clubbing, rash, lesions or edema. NEUROLOGIC: AOx3, no focal motor deficit. SKIN: no jaundice, no rashes   Imaging/Labs: as above  I personally reviewed and interpreted the available labs, imaging and endoscopic files.  Impression and Plan: Christine Weaver is a 55 y.o. female with Pmh asthma, COPD, hyperlipidemia, hypertension and diabetes, who presents for evaluation of positive Cologuard.  The patient has been asymptomatic but had a Cologuard test positive close to 11 months ago.  Discussed cologuard test  results in detail, specifically what it means when the test is positive or negative.  Discussed that there is a possibility that even when the test is positive there may not be a polyp found on colonoscopy.  However, I also emphasized the importance of proceeding with a colonoscopy as the longer timeframe for her to undergo a colonoscopy, the higher the chances that any dysplastic tissue can transform into malignancy or for malignancy to invade further.  She understood but stated that she would like to check her schedule and identify a person that could pick her up after her procedure.  She reported that she will give [place] a call to schedule her colonoscopy once she has set this up.  -Patient to call back to our office to schedule colonoscopy once she has  arranged someone to help with transportation and logistics.  All questions were answered.      Maylon Peppers, MD Gastroenterology and Hepatology [facility]

## 2021-10-13 NOTE — Patient Instructions (Signed)
Please call back to our office to schedule your colonoscopy once you have arranged someone to help you with transportation

## 2021-10-14 LAB — CBC WITH DIFFERENTIAL/PLATELET
Basophils Absolute: 0.1 10*3/uL (ref 0.0–0.2)
Basos: 1 %
EOS (ABSOLUTE): 0.2 10*3/uL (ref 0.0–0.4)
Eos: 3 %
Hematocrit: 39.4 % (ref 34.0–46.6)
Hemoglobin: 12.7 g/dL (ref 11.1–15.9)
Immature Grans (Abs): 0 10*3/uL (ref 0.0–0.1)
Immature Granulocytes: 0 %
Lymphocytes Absolute: 2.5 10*3/uL (ref 0.7–3.1)
Lymphs: 28 %
MCH: 26 pg — ABNORMAL LOW (ref 26.6–33.0)
MCHC: 32.2 g/dL (ref 31.5–35.7)
MCV: 81 fL (ref 79–97)
Monocytes Absolute: 0.6 10*3/uL (ref 0.1–0.9)
Monocytes: 7 %
Neutrophils Absolute: 5.4 10*3/uL (ref 1.4–7.0)
Neutrophils: 61 %
Platelets: 239 10*3/uL (ref 150–450)
RBC: 4.89 x10E6/uL (ref 3.77–5.28)
RDW: 14.1 % (ref 11.7–15.4)
WBC: 8.8 10*3/uL (ref 3.4–10.8)

## 2021-10-14 LAB — LIPID PANEL
Chol/HDL Ratio: 4.6 ratio — ABNORMAL HIGH (ref 0.0–4.4)
Cholesterol, Total: 153 mg/dL (ref 100–199)
HDL: 33 mg/dL — ABNORMAL LOW (ref >39)
LDL Chol Calc (NIH): 95 mg/dL (ref 0–99)
Triglycerides: 142 mg/dL (ref 0–149)
VLDL Cholesterol Cal: 25 mg/dL (ref 5–40)

## 2021-10-14 LAB — CMP14+EGFR
ALT: 13 IU/L (ref 0–32)
AST: 14 IU/L (ref 0–40)
Albumin/Globulin Ratio: 1.7 (ref 1.2–2.2)
Albumin: 4 g/dL (ref 3.8–4.9)
Alkaline Phosphatase: 86 IU/L (ref 44–121)
BUN/Creatinine Ratio: 12 (ref 9–23)
BUN: 12 mg/dL (ref 6–24)
Bilirubin Total: <0.2 mg/dL (ref 0.0–1.2)
CO2: 25 mmol/L (ref 20–29)
Calcium: 9.5 mg/dL (ref 8.7–10.2)
Chloride: 100 mmol/L (ref 96–106)
Creatinine, Ser: 1.04 mg/dL — ABNORMAL HIGH (ref 0.57–1.00)
Globulin, Total: 2.3 g/dL (ref 1.5–4.5)
Glucose: 129 mg/dL — ABNORMAL HIGH (ref 70–99)
Potassium: 4.1 mmol/L (ref 3.5–5.2)
Sodium: 139 mmol/L (ref 134–144)
Total Protein: 6.3 g/dL (ref 6.0–8.5)
eGFR: 64 mL/min/{1.73_m2} (ref >59)

## 2021-10-14 LAB — TSH: TSH: 1.21 u[IU]/mL (ref 0.450–4.500)

## 2021-10-14 LAB — HEMOGLOBIN A1C
Est. average glucose Bld gHb Est-mCnc: 177 mg/dL
Hgb A1c MFr Bld: 7.8 % — ABNORMAL HIGH (ref 4.8–5.6)

## 2021-10-14 LAB — VITAMIN D 25 HYDROXY (VIT D DEFICIENCY, FRACTURES): Vit D, 25-Hydroxy: 55 ng/mL (ref 30.0–100.0)

## 2021-10-16 ENCOUNTER — Other Ambulatory Visit: Payer: Self-pay | Admitting: Cardiology

## 2021-10-25 ENCOUNTER — Telehealth (INDEPENDENT_AMBULATORY_CARE_PROVIDER_SITE_OTHER): Payer: Self-pay

## 2021-10-25 ENCOUNTER — Other Ambulatory Visit (INDEPENDENT_AMBULATORY_CARE_PROVIDER_SITE_OTHER): Payer: Self-pay

## 2021-10-25 DIAGNOSIS — R195 Other fecal abnormalities: Secondary | ICD-10-CM

## 2021-10-25 MED ORDER — PEG 3350-KCL-NA BICARB-NACL 420 G PO SOLR: 4000.0000 mL | ORAL | 0 refills | Status: DC

## 2021-10-25 NOTE — Telephone Encounter (Signed)
LeighAnn Kaylianna Detert, CMA  

## 2021-11-01 ENCOUNTER — Other Ambulatory Visit: Payer: Self-pay

## 2021-11-01 ENCOUNTER — Telehealth: Payer: Self-pay | Admitting: Nurse Practitioner

## 2021-11-01 MED ORDER — LANTUS SOLOSTAR 100 UNIT/ML ~~LOC~~ SOPN: 80.0000 [IU] | PEN_INJECTOR | Freq: Every day | SUBCUTANEOUS | 0 refills | Status: DC

## 2021-11-01 NOTE — Telephone Encounter (Signed)
Correction, she uses [facility] in [place]

## 2021-11-01 NOTE — Telephone Encounter (Signed)
Called patient to find out what dosage she is taking of her Lantus and she sent me to voice mail and I left a detailed voice message for her to call back to discuss and also reminded her to discuss with Dr. Dorris Fetch at her appointment on 11/03/2021.

## 2021-11-01 NOTE — Telephone Encounter (Signed)
Pt returned call and states 80 units.

## 2021-11-01 NOTE — Telephone Encounter (Signed)
Patient has been taking 80 units of Lantus and on your last OV note you have for patient to take 70 units. Patient is calling stating that she is running out of insulin early. Patient has an appointment with you on 11/03/2021.  Please advise how you would like to proceed.

## 2021-11-01 NOTE — Telephone Encounter (Signed)
Patient said that the prescription was only sent in for one box for her lantus and she is not out of the medication. She needs a new RX sent in to [facility] on [address]

## 2021-11-01 NOTE — Telephone Encounter (Signed)
I called the patient and she stated that Dr. Dorris Fetch wrote for her to take 80 units on her paperwork and she is going to bring this in for her next appointment. I let patient know that I was sending a smaller amount to the pharmacy since she is out and she will discuss with Dr. Dorris Fetch at her appointment on 11/03/2021. Patient verbalized an understanding.

## 2021-11-03 ENCOUNTER — Encounter: Payer: Self-pay | Admitting: "Endocrinology

## 2021-11-03 ENCOUNTER — Ambulatory Visit (INDEPENDENT_AMBULATORY_CARE_PROVIDER_SITE_OTHER): Payer: Commercial Managed Care - HMO | Admitting: "Endocrinology

## 2021-11-03 ENCOUNTER — Other Ambulatory Visit: Payer: Self-pay

## 2021-11-03 VITALS — BP 136/64 | HR 84 | Ht 60.0 in | Wt 209.0 lb

## 2021-11-03 DIAGNOSIS — E119 Type 2 diabetes mellitus without complications: Secondary | ICD-10-CM | POA: Diagnosis not present

## 2021-11-03 DIAGNOSIS — I10 Essential (primary) hypertension: Secondary | ICD-10-CM

## 2021-11-03 DIAGNOSIS — Z794 Long term (current) use of insulin: Secondary | ICD-10-CM | POA: Diagnosis not present

## 2021-11-03 DIAGNOSIS — E782 Mixed hyperlipidemia: Secondary | ICD-10-CM | POA: Diagnosis not present

## 2021-11-03 MED ORDER — INSULIN LISPRO (1 UNIT DIAL) 100 UNIT/ML (KWIKPEN): 10.0000 [IU] | PEN_INJECTOR | Freq: Three times a day (TID) | SUBCUTANEOUS | 1 refills | Status: DC

## 2021-11-03 MED ORDER — LANTUS SOLOSTAR 100 UNIT/ML ~~LOC~~ SOPN: 80.0000 [IU] | PEN_INJECTOR | Freq: Every day | SUBCUTANEOUS | 2 refills | Status: DC

## 2021-11-03 NOTE — Progress Notes (Signed)
11/03/2021, 1:43 PM  Endocrinology follow-up note   Subjective:    Patient ID: Christine Weaver, female    DOB: 08-Jul-1966.  Christine Weaver is being seen in follow-up after she was seen in consultation for management of currently uncontrolled symptomatic diabetes requested by  Lindell Spar, MD.   Past Medical History:  Diagnosis Date   Asthma    COPD (chronic obstructive pulmonary disease) ([place])    Hyperlipidemia    Hypertension    Type 2 diabetes mellitus ([place])     Past Surgical History:  Procedure Laterality Date   CESAREAN SECTION      Social History   Socioeconomic History   Marital status: Widowed    Spouse name: Not on file   Number of children: Not on file   Years of education: Not on file   Highest education level: Not on file  Occupational History   Not on file  Tobacco Use   Smoking status: Former    Types: Cigarettes    Quit date: 10/29/2020    Years since quitting: 1.0   Smokeless tobacco: Never   Tobacco comments:    Uses nicotine patches. Has not smoked since new years eve 2021-  06/30/21  Vaping Use   Vaping Use: Never used  Substance and Sexual Activity   Alcohol use: Never   Drug use: Never   Sexual activity: Not on file  Other Topics Concern   Not on file  Social History Narrative   Not on file   Social Determinants of Health   Financial Resource Strain: Low Risk    Difficulty of Paying Living Expenses: Not hard at all  Food Insecurity: No Food Insecurity   Worried About Charity fundraiser in the Last Year: Never true   [place] in the Last Year: Never true  Transportation Needs: No Transportation Needs   Lack of Transportation (Medical): No   Lack of Transportation (Non-Medical): No  Physical Activity: Inactive   Days of Exercise per Week: 0 days   Minutes of Exercise per Session: 0 min  Stress: No Stress Concern Present   Feeling of Stress : Not at all  Social Connections: Socially Isolated    Frequency of Communication with Friends and Family: More than three times a week   Frequency of Social Gatherings with Friends and Family: Never   Attends Religious Services: Never   Marine scientist or Organizations: No   Attends Archivist Meetings: Never   Marital Status: Widowed    Family History  Problem Relation Age of Onset   Thyroid disease Mother    Hypertension Mother    Heart attack Mother    Stroke Mother    Heart failure Mother    Diabetes Mother    Emphysema Mother    Cancer Mother    Diabetes Father    Heart attack Father    Stroke Father    Kidney disease Father    Heart failure Father     Outpatient Encounter Medications as of 11/03/2021  Medication Sig   albuterol (PROVENTIL) (2.5 MG/3ML) 0.083% nebulizer solution Take 2.5 mg by nebulization every 4 (four) hours as needed for wheezing or shortness of breath.   albuterol (VENTOLIN HFA) 108 (90 Base) MCG/ACT inhaler Inhale 2 puffs into the lungs every 4 (four) hours as needed for wheezing or shortness of breath.   amLODipine (NORVASC) 10 MG tablet TAKE 1 TABLET BY MOUTH EVERY  DAY   BD PEN NEEDLE NANO 2ND GEN 32G X 4 MM MISC USE 1 PEN NEEDLE FOR 6 INJECTIONS DAILY FOR DIABETES E11.9   budesonide-formoterol (SYMBICORT) 80-4.5 MCG/ACT inhaler Take 2 puffs first thing in am and then another 2 puffs about 12 hours later.   Continuous Blood Gluc Receiver (FREESTYLE LIBRE 2 READER) DEVI As directed   famotidine (PEPCID) 20 MG tablet One after supper   fluticasone (FLONASE) 50 MCG/ACT nasal spray Place into both nostrils daily.   furosemide (LASIX) 20 MG tablet TAKE 1 TABLET BY MOUTH TWICE A DAY   glucose blood (ACCU-CHEK AVIVA PLUS) test strip    HYDROcodone-acetaminophen (NORCO/VICODIN) 5-325 MG tablet Take 1 tablet by mouth every 12 (twelve) hours as needed.   insulin glargine (LANTUS SOLOSTAR) 100 UNIT/ML Solostar Pen Inject 80 Units into the skin at bedtime.   insulin lispro (HUMALOG KWIKPEN) 100  UNIT/ML KwikPen Inject 10-16 Units into the skin 3 (three) times daily. INJECT 10-16 UNITS INTO THE SKIN 3 TIMES A DAY BEFORE MEALS.   irbesartan (AVAPRO) 75 MG tablet Take 1 tablet (75 mg total) by mouth daily.   metFORMIN (GLUCOPHAGE) 1000 MG tablet Take 1,000 mg by mouth 2 (two) times daily with a meal.    nystatin cream (MYCOSTATIN) Apply 1 application topically 2 (two) times daily.   omeprazole (PRILOSEC) 40 MG capsule TAKE 1 CAPSULE (40 MG TOTAL) BY MOUTH DAILY.   polyethylene glycol-electrolytes (TRILYTE) 420 g solution Take 4,000 mLs by mouth as directed.   SSD 1 % cream Apply 1 application topically daily. For rash   VICTOZA 18 MG/3ML SOPN INJECT 1.8 MG UNDER THE SKIN ONCE DAILY   [DISCONTINUED] HUMALOG KWIKPEN 100 UNIT/ML KwikPen INJECT 10-16 UNITS INTO THE SKIN 3 TIMES A DAY BEFORE MEALS . (Patient taking differently: 12-18 Units 3 (three) times daily.)   [DISCONTINUED] insulin glargine (LANTUS SOLOSTAR) 100 UNIT/ML Solostar Pen Inject 80 Units into the skin at bedtime.   No facility-administered encounter medications on file as of 11/03/2021.    ALLERGIES: Allergies  Allergen Reactions   Morphine And Related    Other Other (See Comments)    Bells Palsy    Oxycodone-Acetaminophen Other (See Comments)    Constipates patient    Latex Itching and Other (See Comments)    Skin "cracks" open    Morphine Itching and Other (See Comments)    VACCINATION STATUS: Immunization History  Administered Date(s) Administered   Moderna SARS-COV2 Booster Vaccination 09/03/2021   Moderna Sars-Covid-2 Vaccination 12/11/2019, 11/13/2020   PNEUMOCOCCAL CONJUGATE-20 10/06/2021   Tdap 03/13/2018   Zoster Recombinat (Shingrix) 10/06/2021    Diabetes She presents for her follow-up diabetic visit. She has type 2 diabetes mellitus. Onset time: She was diagnosed at approximately age of 33 years. Her disease course has been improving. There are no hypoglycemic associated symptoms. Pertinent  negatives for hypoglycemia include no confusion, headaches, pallor or seizures. Pertinent negatives for diabetes include no chest pain, no fatigue, no polydipsia, no polyphagia and no polyuria. There are no hypoglycemic complications. Symptoms are improving. Risk factors for coronary artery disease include dyslipidemia, diabetes mellitus, hypertension, obesity, sedentary lifestyle and tobacco exposure. Current diabetic treatment includes insulin injections. Her weight is increasing steadily. She is following a generally unhealthy diet. When asked about meal planning, she reported none. She has not had a previous visit with a dietitian. She never participates in exercise. Her home blood glucose trend is decreasing steadily. Her breakfast blood glucose range is generally 140-180 mg/dl. Her lunch blood  glucose range is generally 140-180 mg/dl. Her dinner blood glucose range is generally 140-180 mg/dl. Her bedtime blood glucose range is generally 140-180 mg/dl. Her overall blood glucose range is 140-180 mg/dl. (Christine Weaver presents with improved glycemic profile.  Her CGM AGP shows 81% time range, 15% above range, 3% mild hypoglycemia.  Her previsit labs show A1c of 7.8%, generally improving from 9%.  Her recent 2 weeks average blood glucose of 138.     ) An ACE inhibitor/angiotensin II receptor blocker is being taken. Eye exam is current.  Hyperlipidemia This is a chronic problem. The problem is uncontrolled. Recent lipid tests were reviewed and are high. Exacerbating diseases include diabetes and obesity. Pertinent negatives include no chest pain, myalgias or shortness of breath. Current antihyperlipidemic treatment includes statins. Risk factors for coronary artery disease include dyslipidemia, diabetes mellitus, family history, hypertension, obesity and a sedentary lifestyle.  Hypertension This is a chronic problem. The current episode started more than 1 year ago. The problem is uncontrolled. Pertinent negatives  include no chest pain, headaches, palpitations or shortness of breath. Risk factors for coronary artery disease include diabetes mellitus, dyslipidemia, obesity, sedentary lifestyle, smoking/tobacco exposure and family history. Past treatments include ACE inhibitors.  Review of systems: Limited as above.    Objective:    Vitals with BMI 11/03/2021 10/13/2021 10/06/2021  Height 5\' 0"  5\' 0"  -  Weight 209 lbs 207 lbs -  BMI 16.10 96.04 -  Systolic 540 981 191  Diastolic 64 80 78  Pulse 84 94 -    BP 136/64    Pulse 84    Ht 5' (1.524 m)    Wt 209 lb (94.8 kg)    BMI 40.82 kg/m   Wt Readings from Last 3 Encounters:  11/03/21 209 lb (94.8 kg)  10/13/21 207 lb (93.9 kg)  10/06/21 207 lb (93.9 kg)     CMP ( most recent) CMP     Component Value Date/Time   NA 139 10/13/2021 1008   K 4.1 10/13/2021 1008   CL 100 10/13/2021 1008   CO2 25 10/13/2021 1008   GLUCOSE 129 (H) 10/13/2021 1008   GLUCOSE 189 (H) 05/12/2021 1214   BUN 12 10/13/2021 1008   CREATININE 1.04 (H) 10/13/2021 1008   CALCIUM 9.5 10/13/2021 1008   PROT 6.3 10/13/2021 1008   ALBUMIN 4.0 10/13/2021 1008   AST 14 10/13/2021 1008   ALT 13 10/13/2021 1008   ALKPHOS 86 10/13/2021 1008   BILITOT <0.2 10/13/2021 1008   GFRNONAA >60 05/12/2021 1214   GFRAA 80 11/11/2020 0817    Diabetic Labs (most recent): Lab Results  Component Value Date   HGBA1C 7.8 (H) 10/13/2021   HGBA1C 8.2 (A) 06/28/2021   HGBA1C 9.0 (A) 02/24/2021     Lipid Panel ( most recent) Lipid Panel     Component Value Date/Time   CHOL 153 10/13/2021 1008   TRIG 142 10/13/2021 1008   HDL 33 (L) 10/13/2021 1008   CHOLHDL 4.6 (H) 10/13/2021 1008   [place] 95 10/13/2021 1008   LABVLDL 25 10/13/2021 1008      Lab Results  Component Value Date   TSH 1.210 10/13/2021   TSH 1.730 01/27/2021   TSH 2.640 11/11/2020   TSH 1.42 09/23/2019   FREET4 1.25 01/27/2021   FREET4 1.28 11/11/2020      Assessment & Plan:   1. Type 2 diabetes  mellitus with hyperglycemia, with long-term current use of insulin (HCC)  - Christine Weaver has currently uncontrolled symptomatic  type 2 DM since  56 years of age.  Christine Weaver presents with improved glycemic profile.  Her CGM AGP shows 81% time range, 15% above range, 3% mild hypoglycemia.  Her previsit labs show A1c of 7.8%, generally improving from 9%.  Her recent 2 weeks average blood glucose of 138.     Recent labs reviewed.  - I had a long discussion with her about the progressive nature of diabetes and the pathology behind its complications. -her diabetes is complicated by obesity/sedentary life, smoking and she remains at a high risk for more acute and chronic complications which include CAD, CVA, CKD, retinopathy, and neuropathy. These are all discussed in detail with her.  - I have counseled her on diet  and weight management  by adopting a carbohydrate restricted/protein rich diet. Patient is encouraged to switch to  unprocessed or minimally processed     complex starch and increased protein intake (animal or plant source), fruits, and vegetables. -  she is advised to stick to a routine mealtimes to eat 3 meals  a day and avoid unnecessary snacks ( to snack only to correct hypoglycemia).   - she acknowledges that there is a room for improvement in her food and drink choices. - Suggestion is made for her to avoid simple carbohydrates  from her diet including Cakes, Sweet Desserts, Ice Cream, Soda (diet and regular), Sweet Tea, Candies, Chips, Cookies, Store Bought Juices, Alcohol in Excess of  1-2 drinks a day, Artificial Sweeteners,  Coffee Creamer, and "Sugar-free" Products, Lemonade. This will help patient to have more stable blood glucose profile and potentially avoid unintended weight gain.  - she will be scheduled with Christine Weaver, RDN, CDE for diabetes education.  - I have approached her with the following individualized plan to manage  her diabetes and patient agrees:   -She  presents with significant improvement in her glycemic profile.   She will continue to need intensive treatment with basal/bolus insulin in order for her to achieve control of diabetes to target.  She is advised to continue Lantus 80 units nightly, continue Humalog 10 units  3 times a day with meals  for pre-meal BG readings of 90-150mg /dl, plus patient specific correction dose for unexpected hyperglycemia above 150mg /dl, associated with strict monitoring of glucose 4 times a day-before meals and at bedtime. -He is advised to wear her CGM at all times.   - she is warned not to take insulin without proper monitoring per orders. - Adjustment parameters are given to her for hypo and hyperglycemia in writing. - she is encouraged to call clinic for blood glucose levels less than 70 or above 200 mg /dl. - she is advised to continue metformin 1000 mg p.o. twice daily, Victoza 1.8 mg subcutaneously daily.     - Specific targets for  A1c;  LDL, HDL,  and Triglycerides were discussed with the patient.  2) Blood Pressure /Hypertension:   -Her blood pressure is controlled to target.   she is advised to continue her current medications including lisinopril 20 mg p.o. daily with breakfast .  3) Lipids/Hyperlipidemia:   Review of her recent lipid panel showed un controlled  LDL at 102.  She is advised to continue atorvastatin 80 mg p.o. nightly.        Side effects and precautions discussed with her.  4)  Weight/Diet: Her BMI is 40.8  -  clearly complicating her diabetes care.   she is  a candidate for weight loss. I discussed with her  the fact that loss of 5 - 10% of her  current body weight will have the most impact on her diabetes management.  Exercise, and detailed carbohydrates information provided  -  detailed on discharge instructions.  5) Chronic Care/Health Maintenance:  -she  is on ACEI/ARB and Statin medications and  is encouraged to initiate and continue to follow up with Ophthalmology,  Dentist,  Podiatrist at least yearly or according to recommendations, and advised to  quit smoking. I have recommended yearly flu vaccine and pneumonia vaccine at least every 5 years; moderate intensity exercise for up to 150 minutes weekly; and  sleep for at least 7 hours a day.  Her recent screening ABI was negative for PAD in February 2022.  Her next study will be due in February 2027 or sooner if needed.   - she is  advised to maintain close follow up with Lindell Spar, MD for primary care needs, as well as her other providers for optimal and coordinated care.   I spent 41 minutes in the care of the patient today including review of labs from [place], Lipids, Thyroid Function, Hematology (current and previous including abstractions from other facilities); face-to-face time discussing  her blood glucose readings/logs, discussing hypoglycemia and hyperglycemia episodes and symptoms, medications doses, her options of short and long term treatment based on the latest standards of care / guidelines;  discussion about incorporating lifestyle medicine;  and documenting the encounter.    Please refer to Patient Instructions for Blood Glucose Monitoring and Insulin/Medications Dosing Guide"  in media tab for additional information. Please  also refer to " Patient Self Inventory" in the Media  tab for reviewed elements of pertinent patient history.  Christine Weaver participated in the discussions, expressed understanding, and voiced agreement with the above plans.  All questions were answered to her satisfaction. she is encouraged to contact clinic should she have any questions or concerns prior to her return visit.   Follow up plan: - Return in about 4 months (around 03/03/2022) for Bring Meter and Logs- A1c in Office.  Glade Lloyd, MD [facility] [employer] [facility] [address] [place], [place] [address] Phone: [phone]  Fax: [phone]    11/03/2021, 1:43  PM  This note was partially dictated with voice recognition software. Similar sounding words can be transcribed inadequately or may not  be corrected upon review.

## 2021-11-03 NOTE — Patient Instructions (Signed)

## 2021-11-08 NOTE — Progress Notes (Signed)
Cardiology Office Note  Date: 11/09/2021   ID: Christine Weaver, DOB 1966/06/26, MRN [id number]  PCP:  Lindell Spar, MD  Cardiologist:  Rozann Lesches, MD Electrophysiologist:  None   Chief Complaint  Patient presents with   Cardiac follow-up    History of Present Illness: Christine Weaver is a 56 y.o. female last seen in July 2022.  She is here for a follow-up visit.  Reports chronic dyspnea, NYHA class II-III.  She uses supplemental oxygen intermittently with activity and at nighttime.  Not wearing it today. She is following with Pulmonary, saw Dr. Melvyn Novas in November 2022, I reviewed the note.  I also went over her medications which have been adjusted as noted below.  Blood pressure elevated when she first came in, rechecked by me as noted below.  She does have an automatic cuff that she uses at home.  Also continues to follow with Dr. Posey Pronto.  Echocardiogram in February of last year revealed LVEF 55 to 60% with mild LVH and increased left atrial pressure, normal RV contraction.  Still using Lasix 20 mg twice daily, if she stops it she notices legs and hands swell.  Her weight is stable.  Past Medical History:  Diagnosis Date   Asthma    COPD (chronic obstructive pulmonary disease) ([place])    Hyperlipidemia    Hypertension    Type 2 diabetes mellitus ([place])     Past Surgical History:  Procedure Laterality Date   CESAREAN SECTION      Current Outpatient Medications  Medication Sig Dispense Refill   albuterol (PROVENTIL) (2.5 MG/3ML) 0.083% nebulizer solution Take 2.5 mg by nebulization every 4 (four) hours as needed for wheezing or shortness of breath.     albuterol (VENTOLIN HFA) 108 (90 Base) MCG/ACT inhaler Inhale 2 puffs into the lungs every 4 (four) hours as needed for wheezing or shortness of breath. 18 g 5   amLODipine (NORVASC) 10 MG tablet TAKE 1 TABLET BY MOUTH EVERY DAY 90 tablet 1   BD PEN NEEDLE NANO 2ND GEN 32G X 4 MM MISC USE 1 PEN NEEDLE FOR 6 INJECTIONS DAILY  FOR DIABETES E11.9 200 each 11   budesonide-formoterol (SYMBICORT) 80-4.5 MCG/ACT inhaler Take 2 puffs first thing in am and then another 2 puffs about 12 hours later. 1 each 12   Continuous Blood Gluc Receiver (FREESTYLE LIBRE 2 READER) DEVI As directed 1 each 0   famotidine (PEPCID) 20 MG tablet One after supper 30 tablet 11   fluticasone (FLONASE) 50 MCG/ACT nasal spray Place into both nostrils daily.     furosemide (LASIX) 20 MG tablet TAKE 1 TABLET BY MOUTH TWICE A DAY 180 tablet 3   glucose blood (ACCU-CHEK AVIVA PLUS) test strip      HYDROcodone-acetaminophen (NORCO/VICODIN) 5-325 MG tablet Take 1 tablet by mouth every 12 (twelve) hours as needed.     insulin glargine (LANTUS SOLOSTAR) 100 UNIT/ML Solostar Pen Inject 80 Units into the skin at bedtime. 30 mL 2   insulin lispro (HUMALOG KWIKPEN) 100 UNIT/ML KwikPen Inject 10-16 Units into the skin 3 (three) times daily. INJECT 10-16 UNITS INTO THE SKIN 3 TIMES A DAY BEFORE MEALS. 30 mL 1   irbesartan (AVAPRO) 75 MG tablet Take 1 tablet (75 mg total) by mouth daily. 30 tablet 11   metFORMIN (GLUCOPHAGE) 1000 MG tablet Take 1,000 mg by mouth 2 (two) times daily with a meal.      nystatin cream (MYCOSTATIN) Apply 1 application topically 2 (two) times  daily.     omeprazole (PRILOSEC) 40 MG capsule TAKE 1 CAPSULE (40 MG TOTAL) BY MOUTH DAILY. 90 capsule 1   polyethylene glycol-electrolytes (TRILYTE) 420 g solution Take 4,000 mLs by mouth as directed. 4000 mL 0   SSD 1 % cream Apply 1 application topically daily. For rash  0   VICTOZA 18 MG/3ML SOPN INJECT 1.8 MG UNDER THE SKIN ONCE DAILY 9 mL 1   No current facility-administered medications for this visit.   Allergies:  Morphine and related, Other, Oxycodone-acetaminophen, Latex, and Morphine   ROS: No orthopnea or PND.  Physical Exam: VS:  BP (!) 148/88    Pulse 95    Ht 5' (1.524 m)    Wt 209 lb 6.4 oz (95 kg)    SpO2 95%    BMI 40.90 kg/m , BMI Body mass index is 40.9 kg/m.  Wt  Readings from Last 3 Encounters:  11/09/21 209 lb 6.4 oz (95 kg)  11/03/21 209 lb (94.8 kg)  10/13/21 207 lb (93.9 kg)    General: Patient appears comfortable at rest. HEENT: Conjunctiva and lids normal, wearing a mask. Neck: Supple, no elevated JVP or carotid bruits, no thyromegaly. Lungs: Decreased breath sounds, no wheezing.. Cardiac: Regular rate and rhythm, no S3 or significant systolic murmur, no pericardial rub. Extremities: No pitting edema.  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  Recent Labwork: 12/09/2020: Magnesium 1.8 01/12/2021: B Natriuretic Peptide 27.0 10/13/2021: ALT 13; AST 14; BUN 12; Creatinine, Ser 1.04; Hemoglobin 12.7; Platelets 239; Potassium 4.1; Sodium 139; TSH 1.210     Component Value Date/Time   CHOL 153 10/13/2021 1008   TRIG 142 10/13/2021 1008   HDL 33 (L) 10/13/2021 1008   CHOLHDL 4.6 (H) 10/13/2021 1008   [place] 95 10/13/2021 1008    Other Studies Reviewed Today:  Echocardiogram 12/09/2020:  1. Left ventricular ejection fraction, by estimation, is 55 to 60%. The  left ventricle has normal function. The left ventricle has no regional  wall motion abnormalities. There is mild left ventricular hypertrophy.  Left ventricular diastolic parameters  are indeterminate. Elevated left atrial pressure.   2. Right ventricular systolic function is normal. The right ventricular  size is normal.   3. The mitral valve was not well visualized. No evidence of mitral valve  regurgitation. No evidence of mitral stenosis.   4. The aortic valve has an indeterminant number of cusps. Aortic valve  regurgitation is not visualized. No aortic stenosis is present.   5. The inferior vena cava is normal in size with greater than 50%  respiratory variability, suggesting right atrial pressure of 3 mmHg.   Assessment and Plan:  1.  History of pleural effusions, also intermittent leg  swelling.  She is being managed for HFpEF, echocardiogram in February of last year revealed LVEF 55 to 60% with indeterminate diastolic parameters but elevated left atrial pressure, also normal RV contraction.  We will obtain a follow-up echocardiogram to ensure stability.  Otherwise continue Avapro and Lasix.  2.  COPD with chronic hypoxic respiratory failure.  She is followed by Dr. Melvyn Novas.  3.  Essential hypertension, on Norvasc and Avapro.  Blood pressure elevated today, would continue to track with home cuff and follow-up with Dr. Posey Pronto in case further adjustments are necessary.  Medication Adjustments/Labs and Tests Ordered: Current medicines are reviewed at length with the patient today.  Concerns regarding medicines are outlined above.  Tests Ordered: Orders Placed This Encounter  Procedures   EKG 12-Lead   ECHOCARDIOGRAM COMPLETE    Medication Changes: No orders of the defined types were placed in this encounter.   Disposition:  Follow up  6 months.  Signed, Satira Sark, MD, [facility] 11/09/2021 10:39 AM    [facility] [place]. [address] [place], [place] [address] Phone: [phone]; Fax: [phone]

## 2021-11-09 ENCOUNTER — Ambulatory Visit (INDEPENDENT_AMBULATORY_CARE_PROVIDER_SITE_OTHER): Payer: Commercial Managed Care - HMO | Admitting: Cardiology

## 2021-11-09 ENCOUNTER — Encounter: Payer: Self-pay | Admitting: Cardiology

## 2021-11-09 ENCOUNTER — Other Ambulatory Visit: Payer: Self-pay

## 2021-11-09 VITALS — BP 148/88 | HR 95 | Ht 60.0 in | Wt 209.4 lb

## 2021-11-09 DIAGNOSIS — I5032 Chronic diastolic (congestive) heart failure: Secondary | ICD-10-CM

## 2021-11-09 DIAGNOSIS — I10 Essential (primary) hypertension: Secondary | ICD-10-CM

## 2021-11-09 DIAGNOSIS — R0602 Shortness of breath: Secondary | ICD-10-CM

## 2021-11-09 NOTE — Patient Instructions (Addendum)

## 2021-11-10 ENCOUNTER — Ambulatory Visit: Payer: 59 | Admitting: Cardiology

## 2021-11-11 DIAGNOSIS — M5431 Sciatica, right side: Secondary | ICD-10-CM | POA: Diagnosis not present

## 2021-11-11 DIAGNOSIS — G894 Chronic pain syndrome: Secondary | ICD-10-CM | POA: Diagnosis not present

## 2021-11-11 DIAGNOSIS — M542 Cervicalgia: Secondary | ICD-10-CM | POA: Diagnosis not present

## 2021-11-11 DIAGNOSIS — R202 Paresthesia of skin: Secondary | ICD-10-CM | POA: Diagnosis not present

## 2021-11-11 DIAGNOSIS — Z79891 Long term (current) use of opiate analgesic: Secondary | ICD-10-CM | POA: Diagnosis not present

## 2021-11-11 DIAGNOSIS — M5432 Sciatica, left side: Secondary | ICD-10-CM | POA: Diagnosis not present

## 2021-11-11 DIAGNOSIS — E1142 Type 2 diabetes mellitus with diabetic polyneuropathy: Secondary | ICD-10-CM | POA: Diagnosis not present

## 2021-11-11 DIAGNOSIS — G8929 Other chronic pain: Secondary | ICD-10-CM | POA: Diagnosis not present

## 2021-11-11 DIAGNOSIS — M546 Pain in thoracic spine: Secondary | ICD-10-CM | POA: Diagnosis not present

## 2021-11-13 ENCOUNTER — Other Ambulatory Visit: Payer: Self-pay | Admitting: "Endocrinology

## 2021-11-13 ENCOUNTER — Other Ambulatory Visit: Payer: Self-pay | Admitting: Internal Medicine

## 2021-11-13 DIAGNOSIS — E119 Type 2 diabetes mellitus without complications: Secondary | ICD-10-CM

## 2021-11-13 DIAGNOSIS — I10 Essential (primary) hypertension: Secondary | ICD-10-CM

## 2021-11-13 DIAGNOSIS — J449 Chronic obstructive pulmonary disease, unspecified: Secondary | ICD-10-CM

## 2021-11-16 DIAGNOSIS — I509 Heart failure, unspecified: Secondary | ICD-10-CM | POA: Diagnosis not present

## 2021-11-16 DIAGNOSIS — J449 Chronic obstructive pulmonary disease, unspecified: Secondary | ICD-10-CM | POA: Diagnosis not present

## 2021-11-17 NOTE — Patient Instructions (Signed)
Christine Weaver  11/17/2021     @PREFPERIOPPHARMACY @   Your procedure is scheduled on 11/22/2021.   Report to [facility] at  0800  A.M.   Call this number if you have problems the morning of surgery:  [phone]   Remember:  Follow the diet and prep instructions given to you by the office.   Take 40 units of insulin the night before your procedure.    DO NOT take any medications for diabetes the morning of your procedure.    Use your nebulizer and your inhaler before you come and bring your rescue inhaler with you.    Take these medicines the morning of surgery with A SIP OF WATER      amlodipine, hydrocodone (if needed), avapro, prilosec.     Do not wear jewelry, make-up or nail polish.  Do not wear lotions, powders, or perfumes, or deodorant.  Do not shave 48 hours prior to surgery.  Men may shave face and neck.  Do not bring valuables to the hospital.  [facility] is not responsible for any belongings or valuables.  Contacts, dentures or bridgework may not be worn into surgery.  Leave your suitcase in the car.  After surgery it may be brought to your room.  For patients admitted to the hospital, discharge time will be determined by your treatment team.  Patients discharged the day of surgery will not be allowed to drive home and must have someone with them for 24 hours.    Special instructions:   DO NOT smoke tobacco or vape for 24 hours before your procedure.  Please read over the following fact sheets that you were given. Anesthesia Post-op Instructions and Care and Recovery After Surgery      Colonoscopy, Adult, Care After This sheet gives you information about how to care for yourself after your procedure. Your health care provider may also give you more specific instructions. If you have problems or questions, contact your health care provider. What can I expect after the procedure? After the procedure, it is common to have: A small amount of blood  in your stool for 24 hours after the procedure. Some gas. Mild cramping or bloating of your abdomen. Follow these instructions at home: Eating and drinking  Drink enough fluid to keep your urine pale yellow. Follow instructions from your health care provider about eating or drinking restrictions. Resume your normal diet as instructed by your health care provider. Avoid heavy or fried foods that are hard to digest. Activity Rest as told by your health care provider. Avoid sitting for a long time without moving. Get up to take short walks every 1-2 hours. This is important to improve blood flow and breathing. Ask for help if you feel weak or unsteady. Return to your normal activities as told by your health care provider. Ask your health care provider what activities are safe for you. Managing cramping and bloating  Try walking around when you have cramps or feel bloated. Apply heat to your abdomen as told by your health care provider. Use the heat source that your health care provider recommends, such as a moist heat pack or a heating pad. Place a towel between your skin and the heat source. Leave the heat on for 20-30 minutes. Remove the heat if your skin turns bright red. This is especially important if you are unable to feel pain, heat, or cold. You may have a greater risk of getting burned. General  instructions If you were given a sedative during the procedure, it can affect you for several hours. Do not drive or operate machinery until your health care provider says that it is safe. For the first 24 hours after the procedure: Do not sign important documents. Do not drink alcohol. Do your regular daily activities at a slower pace than normal. Eat soft foods that are easy to digest. Take over-the-counter and prescription medicines only as told by your health care provider. Keep all follow-up visits as told by your health care provider. This is important. Contact a health care provider  if: You have blood in your stool 2-3 days after the procedure. Get help right away if you have: More than a small spotting of blood in your stool. Large blood clots in your stool. Swelling of your abdomen. Nausea or vomiting. A fever. Increasing pain in your abdomen that is not relieved with medicine. Summary After the procedure, it is common to have a small amount of blood in your stool. You may also have mild cramping and bloating of your abdomen. If you were given a sedative during the procedure, it can affect you for several hours. Do not drive or operate machinery until your health care provider says that it is safe. Get help right away if you have a lot of blood in your stool, nausea or vomiting, a fever, or increased pain in your abdomen. This information is not intended to replace advice given to you by your health care provider. Make sure you discuss any questions you have with your health care provider. Document Revised: 08/22/2019 Document Reviewed: 05/12/2019 Elsevier Patient Education  [place] After This sheet gives you information about how to care for yourself after your procedure. Your health care provider may also give you more specific instructions. If you have problems or questions, contact your health care provider. What can I expect after the procedure? After the procedure, it is common to have: Tiredness. Forgetfulness about what happened after the procedure. Impaired judgment for important decisions. Nausea or vomiting. Some difficulty with balance. Follow these instructions at home: For the time period you were told by your health care provider:   Rest as needed. Do not participate in activities where you could fall or become injured. Do not drive or use machinery. Do not drink alcohol. Do not take sleeping pills or medicines that cause drowsiness. Do not make important decisions or sign legal documents. Do not take  care of children on your own. Eating and drinking Follow the diet that is recommended by your health care provider. Drink enough fluid to keep your urine pale yellow. If you vomit: Drink water, juice, or soup when you can drink without vomiting. Make sure you have little or no nausea before eating solid foods. General instructions Have a responsible adult stay with you for the time you are told. It is important to have someone help care for you until you are awake and alert. Take over-the-counter and prescription medicines only as told by your health care provider. If you have sleep apnea, surgery and certain medicines can increase your risk for breathing problems. Follow instructions from your health care provider about wearing your sleep device: Anytime you are sleeping, including during daytime naps. While taking prescription pain medicines, sleeping medicines, or medicines that make you drowsy. Avoid smoking. Keep all follow-up visits as told by your health care provider. This is important. Contact a health care provider if: You keep feeling  nauseous or you keep vomiting. You feel light-headed. You are still sleepy or having trouble with balance after 24 hours. You develop a rash. You have a fever. You have redness or swelling around the IV site. Get help right away if: You have trouble breathing. You have new-onset confusion at home. Summary For several hours after your procedure, you may feel tired. You may also be forgetful and have poor judgment. Have a responsible adult stay with you for the time you are told. It is important to have someone help care for you until you are awake and alert. Rest as told. Do not drive or operate machinery. Do not drink alcohol or take sleeping pills. Get help right away if you have trouble breathing, or if you suddenly become confused. This information is not intended to replace advice given to you by your health care provider. Make sure you  discuss any questions you have with your health care provider. Document Revised: 07/01/2020 Document Reviewed: 09/18/2019 Elsevier Patient Education  2022 [employer].

## 2021-11-18 ENCOUNTER — Telehealth: Payer: Self-pay | Admitting: "Endocrinology

## 2021-11-18 ENCOUNTER — Encounter (HOSPITAL_COMMUNITY): Payer: Self-pay

## 2021-11-18 ENCOUNTER — Encounter (HOSPITAL_COMMUNITY)
Admission: RE | Admit: 2021-11-18 | Discharge: 2021-11-18 | Disposition: A | Discharge status: Home / Self Care | Payer: Medicare Other | Source: Ambulatory Visit | Attending: Gastroenterology | Admitting: Gastroenterology

## 2021-11-18 NOTE — Telephone Encounter (Signed)
Left a message requesting pt return call to the office. ?

## 2021-11-18 NOTE — Telephone Encounter (Signed)
Pt states [facility] reached out to her and said her metformin and lantus needs PA

## 2021-11-21 ENCOUNTER — Other Ambulatory Visit (INDEPENDENT_AMBULATORY_CARE_PROVIDER_SITE_OTHER): Payer: Self-pay

## 2021-11-21 ENCOUNTER — Encounter (INDEPENDENT_AMBULATORY_CARE_PROVIDER_SITE_OTHER): Payer: Self-pay

## 2021-11-21 DIAGNOSIS — Z01812 Encounter for preprocedural laboratory examination: Secondary | ICD-10-CM

## 2021-11-21 DIAGNOSIS — I10 Essential (primary) hypertension: Secondary | ICD-10-CM

## 2021-11-23 DIAGNOSIS — E1165 Type 2 diabetes mellitus with hyperglycemia: Secondary | ICD-10-CM | POA: Diagnosis not present

## 2021-11-23 DIAGNOSIS — E119 Type 2 diabetes mellitus without complications: Secondary | ICD-10-CM | POA: Diagnosis not present

## 2021-12-05 ENCOUNTER — Telehealth: Payer: Self-pay | Admitting: "Endocrinology

## 2021-12-05 MED ORDER — METFORMIN HCL 1000 MG PO TABS: 1000.0000 mg | ORAL_TABLET | Freq: Two times a day (BID) | ORAL | 0 refills | Status: DC

## 2021-12-05 NOTE — Telephone Encounter (Signed)
Pt left a VM stating she is still waiting on her Metformin to be called in. Please Advise. [facility] [address]

## 2021-12-05 NOTE — Telephone Encounter (Signed)
Rx sent 

## 2021-12-07 ENCOUNTER — Encounter (INDEPENDENT_AMBULATORY_CARE_PROVIDER_SITE_OTHER): Payer: Self-pay

## 2021-12-09 ENCOUNTER — Ambulatory Visit (HOSPITAL_COMMUNITY)
Admission: RE | Admit: 2021-12-09 | Discharge: 2021-12-09 | Disposition: A | Discharge status: Home / Self Care | Payer: Medicare Other | Source: Ambulatory Visit | Attending: Cardiology | Admitting: Cardiology

## 2021-12-09 ENCOUNTER — Other Ambulatory Visit: Payer: Self-pay

## 2021-12-09 DIAGNOSIS — R0602 Shortness of breath: Secondary | ICD-10-CM | POA: Insufficient documentation

## 2021-12-09 LAB — ECHOCARDIOGRAM COMPLETE
AR max vel: 2.05 cm2
AV Area VTI: 1.89 cm2
AV Area mean vel: 1.95 cm2
AV Mean grad: 8 mmHg
AV Peak grad: 13.1 mmHg
Ao pk vel: 1.81 m/s
Area-P 1/2: 2.62 cm2
S' Lateral: 3 cm

## 2021-12-09 NOTE — Progress Notes (Signed)
*  PRELIMINARY RESULTS* Echocardiogram 2D Echocardiogram has been performed.  Samuel Germany 12/09/2021, 11:21 AM

## 2021-12-13 DIAGNOSIS — R202 Paresthesia of skin: Secondary | ICD-10-CM | POA: Diagnosis not present

## 2021-12-13 DIAGNOSIS — M5431 Sciatica, right side: Secondary | ICD-10-CM | POA: Diagnosis not present

## 2021-12-13 DIAGNOSIS — Z79891 Long term (current) use of opiate analgesic: Secondary | ICD-10-CM | POA: Diagnosis not present

## 2021-12-13 DIAGNOSIS — M546 Pain in thoracic spine: Secondary | ICD-10-CM | POA: Diagnosis not present

## 2021-12-13 DIAGNOSIS — G8929 Other chronic pain: Secondary | ICD-10-CM | POA: Diagnosis not present

## 2021-12-13 DIAGNOSIS — G894 Chronic pain syndrome: Secondary | ICD-10-CM | POA: Diagnosis not present

## 2021-12-13 DIAGNOSIS — M545 Low back pain, unspecified: Secondary | ICD-10-CM | POA: Diagnosis not present

## 2021-12-13 DIAGNOSIS — M5432 Sciatica, left side: Secondary | ICD-10-CM | POA: Diagnosis not present

## 2021-12-13 DIAGNOSIS — E1142 Type 2 diabetes mellitus with diabetic polyneuropathy: Secondary | ICD-10-CM | POA: Diagnosis not present

## 2021-12-13 DIAGNOSIS — M542 Cervicalgia: Secondary | ICD-10-CM | POA: Diagnosis not present

## 2021-12-14 ENCOUNTER — Other Ambulatory Visit (HOSPITAL_COMMUNITY)
Admission: RE | Admit: 2021-12-14 | Discharge: 2021-12-14 | Disposition: A | Discharge status: Home / Self Care | Payer: Medicare Other | Source: Ambulatory Visit | Attending: Gastroenterology | Admitting: Gastroenterology

## 2021-12-14 DIAGNOSIS — R195 Other fecal abnormalities: Secondary | ICD-10-CM | POA: Diagnosis present

## 2021-12-14 DIAGNOSIS — I10 Essential (primary) hypertension: Secondary | ICD-10-CM | POA: Diagnosis not present

## 2021-12-14 DIAGNOSIS — Z01812 Encounter for preprocedural laboratory examination: Secondary | ICD-10-CM | POA: Insufficient documentation

## 2021-12-14 LAB — BASIC METABOLIC PANEL
Anion gap: 10 (ref 5–15)
BUN: 13 mg/dL (ref 6–20)
CO2: 28 mmol/L (ref 22–32)
Calcium: 9.3 mg/dL (ref 8.9–10.3)
Chloride: 100 mmol/L (ref 98–111)
Creatinine, Ser: 0.87 mg/dL (ref 0.44–1.00)
GFR, Estimated: >60 mL/min (ref >60)
Glucose, Bld: 184 mg/dL — ABNORMAL HIGH (ref 70–99)
Potassium: 3.9 mmol/L (ref 3.5–5.1)
Sodium: 138 mmol/L (ref 135–145)

## 2021-12-15 ENCOUNTER — Other Ambulatory Visit: Payer: Self-pay | Admitting: Internal Medicine

## 2021-12-15 DIAGNOSIS — J449 Chronic obstructive pulmonary disease, unspecified: Secondary | ICD-10-CM

## 2021-12-16 ENCOUNTER — Other Ambulatory Visit: Payer: Self-pay

## 2021-12-16 ENCOUNTER — Ambulatory Visit (HOSPITAL_COMMUNITY): Payer: Medicare Other | Admitting: Anesthesiology

## 2021-12-16 ENCOUNTER — Ambulatory Visit (HOSPITAL_COMMUNITY)
Admission: RE | Admit: 2021-12-16 | Discharge: 2021-12-16 | Disposition: A | Discharge status: Home / Self Care | Payer: Medicare Other | Attending: Gastroenterology | Admitting: Gastroenterology

## 2021-12-16 ENCOUNTER — Encounter (HOSPITAL_COMMUNITY): Payer: Self-pay | Admitting: Gastroenterology

## 2021-12-16 ENCOUNTER — Ambulatory Visit (HOSPITAL_BASED_OUTPATIENT_CLINIC_OR_DEPARTMENT_OTHER): Payer: Medicare Other | Admitting: Anesthesiology

## 2021-12-16 ENCOUNTER — Encounter (HOSPITAL_COMMUNITY)
Admission: RE | Disposition: A | Discharge status: Home / Self Care | Payer: Self-pay | Source: Home / Self Care | Attending: Gastroenterology

## 2021-12-16 DIAGNOSIS — Z87891 Personal history of nicotine dependence: Secondary | ICD-10-CM | POA: Diagnosis not present

## 2021-12-16 DIAGNOSIS — J449 Chronic obstructive pulmonary disease, unspecified: Secondary | ICD-10-CM | POA: Insufficient documentation

## 2021-12-16 DIAGNOSIS — D120 Benign neoplasm of cecum: Secondary | ICD-10-CM | POA: Insufficient documentation

## 2021-12-16 DIAGNOSIS — E119 Type 2 diabetes mellitus without complications: Secondary | ICD-10-CM | POA: Diagnosis not present

## 2021-12-16 DIAGNOSIS — E785 Hyperlipidemia, unspecified: Secondary | ICD-10-CM | POA: Diagnosis not present

## 2021-12-16 DIAGNOSIS — D122 Benign neoplasm of ascending colon: Secondary | ICD-10-CM | POA: Insufficient documentation

## 2021-12-16 DIAGNOSIS — K219 Gastro-esophageal reflux disease without esophagitis: Secondary | ICD-10-CM | POA: Diagnosis not present

## 2021-12-16 DIAGNOSIS — K648 Other hemorrhoids: Secondary | ICD-10-CM

## 2021-12-16 DIAGNOSIS — K5140 Inflammatory polyps of colon without complications: Secondary | ICD-10-CM | POA: Diagnosis not present

## 2021-12-16 DIAGNOSIS — Z9981 Dependence on supplemental oxygen: Secondary | ICD-10-CM | POA: Diagnosis not present

## 2021-12-16 DIAGNOSIS — I10 Essential (primary) hypertension: Secondary | ICD-10-CM | POA: Insufficient documentation

## 2021-12-16 DIAGNOSIS — R195 Other fecal abnormalities: Secondary | ICD-10-CM | POA: Diagnosis not present

## 2021-12-16 DIAGNOSIS — Z6841 Body Mass Index (BMI) 40.0 and over, adult: Secondary | ICD-10-CM | POA: Insufficient documentation

## 2021-12-16 DIAGNOSIS — Z1211 Encounter for screening for malignant neoplasm of colon: Secondary | ICD-10-CM | POA: Diagnosis not present

## 2021-12-16 DIAGNOSIS — Z794 Long term (current) use of insulin: Secondary | ICD-10-CM | POA: Diagnosis not present

## 2021-12-16 DIAGNOSIS — D125 Benign neoplasm of sigmoid colon: Secondary | ICD-10-CM | POA: Insufficient documentation

## 2021-12-16 DIAGNOSIS — K635 Polyp of colon: Secondary | ICD-10-CM

## 2021-12-16 DIAGNOSIS — K6389 Other specified diseases of intestine: Secondary | ICD-10-CM | POA: Diagnosis not present

## 2021-12-16 DIAGNOSIS — D124 Benign neoplasm of descending colon: Secondary | ICD-10-CM | POA: Insufficient documentation

## 2021-12-16 DIAGNOSIS — D123 Benign neoplasm of transverse colon: Secondary | ICD-10-CM | POA: Diagnosis not present

## 2021-12-16 DIAGNOSIS — Z7984 Long term (current) use of oral hypoglycemic drugs: Secondary | ICD-10-CM | POA: Insufficient documentation

## 2021-12-16 HISTORY — PX: SUBMUCOSAL TATTOO INJECTION: SHX6856

## 2021-12-16 HISTORY — PX: HOT HEMOSTASIS: SHX5433

## 2021-12-16 HISTORY — PX: COLONOSCOPY WITH PROPOFOL: SHX5780

## 2021-12-16 HISTORY — PX: POLYPECTOMY: SHX5525

## 2021-12-16 LAB — GLUCOSE, CAPILLARY
Glucose-Capillary: 118 mg/dL — ABNORMAL HIGH (ref 70–99)
Glucose-Capillary: 94 mg/dL (ref 70–99)

## 2021-12-16 LAB — HM COLONOSCOPY

## 2021-12-16 SURGERY — COLONOSCOPY WITH PROPOFOL
Anesthesia: General

## 2021-12-16 MED ORDER — PROPOFOL 500 MG/50ML IV EMUL
INTRAVENOUS | Status: DC | PRN
  Administered 2021-12-16: 150 ug/kg/min via INTRAVENOUS

## 2021-12-16 MED ORDER — SPOT INK MARKER SYRINGE KIT
PACK | SUBMUCOSAL | Status: AC
  Filled 2021-12-16: qty 5

## 2021-12-16 MED ORDER — PROPOFOL 10 MG/ML IV BOLUS
INTRAVENOUS | Status: DC | PRN
  Administered 2021-12-16: 20 mg via INTRAVENOUS
  Administered 2021-12-16: 10 mg via INTRAVENOUS
  Administered 2021-12-16: 20 mg via INTRAVENOUS
  Administered 2021-12-16: 30 mg via INTRAVENOUS
  Administered 2021-12-16: 100 mg via INTRAVENOUS
  Administered 2021-12-16 (×2): 20 mg via INTRAVENOUS
  Administered 2021-12-16: 50 mg via INTRAVENOUS
  Administered 2021-12-16 (×2): 20 mg via INTRAVENOUS

## 2021-12-16 MED ORDER — LACTATED RINGERS IV SOLN: INTRAVENOUS | Status: DC

## 2021-12-16 MED ORDER — STERILE WATER FOR IRRIGATION IR SOLN
Status: DC | PRN
  Administered 2021-12-16: 60 mL

## 2021-12-16 MED ORDER — SPOT INK MARKER SYRINGE KIT
PACK | SUBMUCOSAL | Status: DC | PRN
  Administered 2021-12-16: 4 mL via SUBMUCOSAL

## 2021-12-16 NOTE — H&P (Signed)
Christine Weaver is an 56 y.o. female.   Chief Complaint: positive cologuard HPI: Christine Weaver is a 56 y.o. female with Pmh asthma, COPD, hyperlipidemia, hypertension and diabetes, who presents for evaluation of positive Cologuard.   Patient had positive Cologuard on 11/16/2020.     Patient denies having any complaints.  The patient denies having any nausea, vomiting, fever, chills, hematochezia, melena, hematemesis, abdominal distention, abdominal pain, diarrhea, jaundice, pruritus. Has lsot 50 lb after she has made adjustments in her diet to lose weight.   Past Medical History:  Diagnosis Date   Asthma    COPD (chronic obstructive pulmonary disease) ([place])    Hyperlipidemia    Hypertension    Type 2 diabetes mellitus ([place])     Past Surgical History:  Procedure Laterality Date   CESAREAN SECTION      Family History  Problem Relation Age of Onset   Thyroid disease Mother    Hypertension Mother    Heart attack Mother    Stroke Mother    Heart failure Mother    Diabetes Mother    Emphysema Mother    Cancer Mother    Diabetes Father    Heart attack Father    Stroke Father    Kidney disease Father    Heart failure Father    Social History:  reports that she quit smoking about 13 months ago. Her smoking use included cigarettes. She has never used smokeless tobacco. She reports that she does not drink alcohol and does not use drugs.  Allergies:  Allergies  Allergen Reactions   Morphine And Related Itching   Other Other (See Comments)    Bells Palsy    Oxycodone-Acetaminophen Other (See Comments)    Constipates patient    Latex Itching and Other (See Comments)    Skin "cracks" open     Medications Prior to Admission  Medication Sig Dispense Refill   albuterol (PROVENTIL) (2.5 MG/3ML) 0.083% nebulizer solution Take 2.5 mg by nebulization every 4 (four) hours as needed for wheezing or shortness of breath.     albuterol (VENTOLIN HFA) 108 (90 Base) MCG/ACT inhaler Inhale 2  puffs into the lungs every 4 (four) hours as needed for wheezing or shortness of breath. 18 g 5   amLODipine (NORVASC) 10 MG tablet TAKE 1 TABLET BY MOUTH EVERY DAY 90 tablet 1   budesonide-formoterol (SYMBICORT) 80-4.5 MCG/ACT inhaler Take 2 puffs first thing in am and then another 2 puffs about 12 hours later. 1 each 12   cholecalciferol (VITAMIN D3) 25 MCG (1000 UNIT) tablet Take 1,000 Units by mouth daily.     famotidine (PEPCID) 20 MG tablet One after supper (Patient taking differently: Take 20 mg by mouth every evening. One after supper) 30 tablet 11   fluticasone (FLONASE) 50 MCG/ACT nasal spray Place into both nostrils daily.     furosemide (LASIX) 20 MG tablet TAKE 1 TABLET BY MOUTH TWICE A DAY 180 tablet 3   HYDROcodone-acetaminophen (NORCO/VICODIN) 5-325 MG tablet Take 1 tablet by mouth every 12 (twelve) hours as needed for severe pain.     insulin glargine (LANTUS SOLOSTAR) 100 UNIT/ML Solostar Pen Inject 80 Units into the skin at bedtime. 30 mL 2   insulin lispro (HUMALOG KWIKPEN) 100 UNIT/ML KwikPen Inject 10-16 Units into the skin 3 (three) times daily. INJECT 10-16 UNITS INTO THE SKIN 3 TIMES A DAY BEFORE MEALS. 30 mL 1   irbesartan (AVAPRO) 75 MG tablet Take 1 tablet (75 mg total) by mouth daily. [place]  tablet 11   metFORMIN (GLUCOPHAGE) 1000 MG tablet Take 1 tablet (1,000 mg total) by mouth 2 (two) times daily with a meal. 180 tablet 0   nicotine (NICODERM CQ - DOSED IN MG/24 HOURS) 21 mg/24hr patch Place 21 mg onto the skin daily as needed (nicotine).     nystatin cream (MYCOSTATIN) Apply 1 application topically 2 (two) times daily.     omeprazole (PRILOSEC) 40 MG capsule TAKE 1 CAPSULE (40 MG TOTAL) BY MOUTH DAILY. 90 capsule 1   polyethylene glycol-electrolytes (TRILYTE) 420 g solution Take 4,000 mLs by mouth as directed. 4000 mL 0   VICTOZA 18 MG/3ML SOPN INJECT 1.8 MG UNDER THE SKIN ONCE DAILY 9 mL 1   ANORO ELLIPTA 62.5-25 MCG/ACT AEPB TAKE 1 PUFF BY MOUTH EVERY DAY 60 each 5    BD PEN NEEDLE NANO 2ND GEN 32G X 4 MM MISC USE 1 PEN NEEDLE FOR 6 INJECTIONS DAILY FOR DIABETES E11.9 200 each 11   Continuous Blood Gluc Receiver (FREESTYLE LIBRE 2 READER) DEVI As directed 1 each 0   glucose blood (ACCU-CHEK AVIVA PLUS) test strip       Results for orders placed or performed during the hospital encounter of 12/16/21 (from the past 48 hour(s))  Glucose, capillary     Status: Abnormal   Collection Time: 12/16/21  7:59 AM  Result Value Ref Range   Glucose-Capillary 118 (H) 70 - 99 mg/dL    Comment: Glucose reference range applies only to samples taken after fasting for at least 8 hours.   No results found.  Review of Systems  Constitutional: Negative.   HENT: Negative.    Eyes: Negative.   Respiratory: Negative.    Cardiovascular: Negative.   Gastrointestinal: Negative.   Endocrine: Negative.   Genitourinary: Negative.   Musculoskeletal: Negative.   Skin: Negative.   Allergic/Immunologic: Negative.   Neurological: Negative.   Hematological: Negative.   Psychiatric/Behavioral: Negative.     Blood pressure (!) 161/94, pulse 88, temperature 98.4 F (36.9 C), temperature source Oral, resp. rate (!) 24, height 5' (1.524 m), weight 93.4 kg. Physical Exam  GENERAL: The patient is AO x3, in no acute distress. HEENT: Head is normocephalic and atraumatic. EOMI are intact. Mouth is well hydrated and without lesions. NECK: Supple. No masses LUNGS: Clear to auscultation. No presence of rhonchi/wheezing/rales. Adequate chest expansion HEART: RRR, normal s1 and s2. ABDOMEN: Soft, nontender, no guarding, no peritoneal signs, and nondistended. BS +. No masses. EXTREMITIES: Without any cyanosis, clubbing, rash, lesions or edema. NEUROLOGIC: AOx3, no focal motor deficit. SKIN: no jaundice, no rashes  Assessment/Plan Christine Weaver is a 56 y.o. female with Pmh asthma, COPD, hyperlipidemia, hypertension and diabetes, who presents for evaluation of positive Cologuard.  We will  proceed with colonoscopy.  Harvel Quale, MD 12/16/2021, 8:25 AM

## 2021-12-16 NOTE — Op Note (Signed)
[facility] Patient Name: Christine Weaver Procedure Date: 12/16/2021 9:05 AM MRN: [id number] Date of Birth: 1966/09/21 Attending MD: Maylon Peppers ,  CSN: [id number] Age: 56 Admit Type: Outpatient Procedure:                Colonoscopy Indications:              Positive Cologuard test Providers:                Maylon Peppers, Lambert Mody, Kristine L.                            Risa Grill, Technician Referring MD:              Medicines:                Monitored Anesthesia Care Complications:            No immediate complications. Estimated Blood Loss:     Estimated blood loss: none. Procedure:                Pre-Anesthesia Assessment:                           - Prior to the procedure, a History and Physical                            was performed, and patient medications, allergies                            and sensitivities were reviewed. The patient's                            tolerance of previous anesthesia was reviewed.                           - The risks and benefits of the procedure and the                            sedation options and risks were discussed with the                            patient. All questions were answered and informed                            consent was obtained.                           - ASA Grade Assessment: II - A patient with mild                            systemic disease.                           After obtaining informed consent, the colonoscope                            was passed under direct vision. Throughout the  procedure, the patient's blood pressure, pulse, and                            oxygen saturations were monitored continuously. The                            [id number]-[id number] ([id number]) scope was introduced through                            the anus and advanced to the the cecum, identified                            by appendiceal orifice and ileocecal valve. The                             colonoscopy was performed without difficulty. The                            patient tolerated the procedure well. Scope In: 9:19:14 AM Scope Out: 9:58:50 AM Scope Withdrawal Time: 0 hours 28 minutes 4 seconds  Total Procedure Duration: 0 hours 39 minutes 36 seconds  Findings:      The perianal and digital rectal examinations were normal.      Six sessile polyps were found in the descending colon, transverse colon,       ascending colon and cecum. The polyps were 3 to 6 mm in size. These       polyps were removed with a cold snare. Resection and retrieval were       complete.      Two sessile polyps were found in the descending colon. The polyps were 8       to 10 mm in size. These polyps were removed with a hot snare. Resection       and retrieval were complete.      A 4 mm polyp was found in the sigmoid colon. The polyp was sessile and       had an ulcerated surface. The polyp was removed with a hot snare.       Resection and retrieval were complete. An area proximal to the       polypectomy was successfully injected with 4 mL [place] ink for tattooing.      Non-bleeding internal hemorrhoids were found during retroflexion. The       hemorrhoids were small. Impression:               - Six 3 to 6 mm polyps in the descending colon, in                            the transverse colon, in the ascending colon and in                            the cecum, removed with a cold snare. Resected and                            retrieved.                           -  Two 8 to 10 mm polyps in the descending colon,                            removed with a hot snare. Resected and retrieved.                           - One 4 mm polyp in the sigmoid colon, removed with                            a hot snare. Resected and retrieved. Injected.                           - Non-bleeding internal hemorrhoids. Moderate Sedation:      Per Anesthesia Care Recommendation:           - Discharge patient to home  (ambulatory).                           - Resume previous diet.                           - Await pathology results.                           - Repeat colonoscopy in 3 years for surveillance. Procedure Code(s):        --- Professional ---                           [id number], Colonoscopy, flexible; with removal of                            tumor(s), polyp(s), or other lesion(s) by snare                            technique                           45381, Colonoscopy, flexible; with directed                            submucosal injection(s), any substance Diagnosis Code(s):        --- Professional ---                           K63.5, Polyp of colon                           K64.8, Other hemorrhoids                           R19.5, Other fecal abnormalities CPT copyright 2019 American Medical Association. All rights reserved. The codes documented in this report are preliminary and upon coder review may  be revised to meet current compliance requirements. Maylon Peppers, MD Maylon Peppers,  12/16/2021 10:05:23 AM This report has been signed electronically. Number of Addenda: 0

## 2021-12-16 NOTE — Anesthesia Preprocedure Evaluation (Addendum)
Anesthesia Evaluation  Patient identified by MRN, date of birth, ID band Patient awake    Reviewed: Allergy & Precautions, NPO status , Patient's Chart, lab work & pertinent test results  History of Anesthesia Complications Negative for: history of anesthetic complications  Airway Mallampati: II  TM Distance: >3 FB Neck ROM: Full    Dental  (+) Upper Dentures, Lower Dentures   Pulmonary shortness of breath, with exertion and Long-Term Oxygen Therapy, asthma , COPD,  COPD inhaler and oxygen dependent, former smoker (quit a year ago),    breath sounds clear to auscultation       Cardiovascular Exercise Tolerance: Poor hypertension, Pt. on medications + DOE  Normal cardiovascular exam Rhythm:Regular Rate:Normal  1. Left ventricular ejection fraction, by estimation, is 60 to 65%. The left ventricle has normal function. The left ventricle has no regional wall motion abnormalities. There is mild left ventricular hypertrophy. Left ventricular diastolic parameters are consistent with Grade I diastolic dysfunction (impaired relaxation).  2. Right ventricular systolic function is normal. The right ventricular size is normal.  3. Left atrial size was mildly dilated.  4. The mitral valve is normal in structure. No evidence of mitral valve regurgitation. No evidence of mitral stenosis.  5. The aortic valve is normal in structure. Aortic valve regurgitation is not visualized. No aortic stenosis is present.  6. The inferior vena cava is normal in size with greater than 50% respiratory variability, suggesting right atrial pressure of 3 mmHg.    Neuro/Psych  Neuromuscular disease negative psych ROS   GI/Hepatic Neg liver ROS, GERD  Medicated and Controlled,  Endo/Other  diabetes, Well Controlled, Type 2, Oral Hypoglycemic Agents, Insulin DependentMorbid obesity  Renal/GU negative Renal ROS  negative genitourinary    Musculoskeletal negative musculoskeletal ROS (+)   Abdominal   Peds negative pediatric ROS (+)  Hematology negative hematology ROS (+)   Anesthesia Other Findings   Reproductive/Obstetrics negative OB ROS                            Anesthesia Physical Anesthesia Plan  ASA: 3  Anesthesia Plan: General   Post-op Pain Management:    Induction: Intravenous  PONV Risk Score and Plan: TIVA  Airway Management Planned: Nasal Cannula and Natural Airway  Additional Equipment:   Intra-op Plan:   Post-operative Plan:   Informed Consent: I have reviewed the patients History and Physical, chart, labs and discussed the procedure including the risks, benefits and alternatives for the proposed anesthesia with the patient or authorized representative who has indicated his/her understanding and acceptance.     Dental advisory given  Plan Discussed with: CRNA and Surgeon  Anesthesia Plan Comments:         Anesthesia Quick Evaluation

## 2021-12-16 NOTE — Transfer of Care (Signed)
Immediate Anesthesia Transfer of Care Note  Patient: Christine Weaver  Procedure(s) Performed: COLONOSCOPY WITH PROPOFOL POLYPECTOMY HOT HEMOSTASIS (ARGON PLASMA COAGULATION/BICAP) SUBMUCOSAL TATTOO INJECTION  Patient Location: PACU  Anesthesia Type:General  Level of Consciousness: awake  Airway & Oxygen Therapy: Patient Spontanous Breathing  Post-op Assessment: Report given to RN  Post vital signs: Reviewed  Last Vitals:  Vitals Value Taken Time  BP    Temp    Pulse    Resp    SpO2      Last Pain:  Vitals:   12/16/21 0915  TempSrc:   PainSc: 0-No pain      Patients Stated Pain Goal: 9 (55/83/16 7425)  Complications: No notable events documented.

## 2021-12-16 NOTE — Discharge Instructions (Signed)
You are being discharged to home.  Resume your previous diet.  We are waiting for your pathology results.  Your physician has recommended a repeat colonoscopy in three years for surveillance.  

## 2021-12-16 NOTE — Anesthesia Postprocedure Evaluation (Signed)
Anesthesia Post Note  Patient: Probation officer  Procedure(s) Performed: COLONOSCOPY WITH PROPOFOL POLYPECTOMY HOT HEMOSTASIS (ARGON PLASMA COAGULATION/BICAP) SUBMUCOSAL TATTOO INJECTION  Patient location during evaluation: Endoscopy Anesthesia Type: General Level of consciousness: awake and alert Pain management: pain level controlled Vital Signs Assessment: post-procedure vital signs reviewed and stable Respiratory status: spontaneous breathing Cardiovascular status: blood pressure returned to baseline and stable Postop Assessment: no apparent nausea or vomiting Anesthetic complications: no   No notable events documented.   Last Vitals:  Vitals:   12/16/21 0751 12/16/21 0753  BP:  (!) 161/94  Pulse:  88  Resp:  (!) 24  Temp: 36.9 C     Last Pain:  Vitals:   12/16/21 0915  TempSrc:   PainSc: 0-No pain                 Dim Meisinger

## 2021-12-17 DIAGNOSIS — J449 Chronic obstructive pulmonary disease, unspecified: Secondary | ICD-10-CM | POA: Diagnosis not present

## 2021-12-17 DIAGNOSIS — I509 Heart failure, unspecified: Secondary | ICD-10-CM | POA: Diagnosis not present

## 2021-12-19 ENCOUNTER — Other Ambulatory Visit: Payer: Self-pay | Admitting: "Endocrinology

## 2021-12-19 LAB — SURGICAL PATHOLOGY

## 2021-12-20 ENCOUNTER — Encounter (INDEPENDENT_AMBULATORY_CARE_PROVIDER_SITE_OTHER): Payer: Self-pay | Admitting: *Deleted

## 2021-12-20 ENCOUNTER — Encounter (HOSPITAL_COMMUNITY): Payer: Self-pay | Admitting: Gastroenterology

## 2021-12-24 DIAGNOSIS — E1165 Type 2 diabetes mellitus with hyperglycemia: Secondary | ICD-10-CM | POA: Diagnosis not present

## 2021-12-24 DIAGNOSIS — E119 Type 2 diabetes mellitus without complications: Secondary | ICD-10-CM | POA: Diagnosis not present

## 2021-12-28 ENCOUNTER — Other Ambulatory Visit: Payer: Self-pay | Admitting: "Endocrinology

## 2022-01-01 ENCOUNTER — Other Ambulatory Visit: Payer: Self-pay | Admitting: "Endocrinology

## 2022-01-01 ENCOUNTER — Other Ambulatory Visit: Payer: Self-pay | Admitting: Internal Medicine

## 2022-01-01 DIAGNOSIS — K219 Gastro-esophageal reflux disease without esophagitis: Secondary | ICD-10-CM

## 2022-01-01 DIAGNOSIS — E119 Type 2 diabetes mellitus without complications: Secondary | ICD-10-CM

## 2022-01-01 DIAGNOSIS — Z794 Long term (current) use of insulin: Secondary | ICD-10-CM

## 2022-01-10 DIAGNOSIS — Z79891 Long term (current) use of opiate analgesic: Secondary | ICD-10-CM | POA: Diagnosis not present

## 2022-01-10 DIAGNOSIS — M5431 Sciatica, right side: Secondary | ICD-10-CM | POA: Diagnosis not present

## 2022-01-10 DIAGNOSIS — M542 Cervicalgia: Secondary | ICD-10-CM | POA: Diagnosis not present

## 2022-01-10 DIAGNOSIS — G8929 Other chronic pain: Secondary | ICD-10-CM | POA: Diagnosis not present

## 2022-01-10 DIAGNOSIS — M546 Pain in thoracic spine: Secondary | ICD-10-CM | POA: Diagnosis not present

## 2022-01-10 DIAGNOSIS — G894 Chronic pain syndrome: Secondary | ICD-10-CM | POA: Diagnosis not present

## 2022-01-10 DIAGNOSIS — M5432 Sciatica, left side: Secondary | ICD-10-CM | POA: Diagnosis not present

## 2022-01-10 DIAGNOSIS — M545 Low back pain, unspecified: Secondary | ICD-10-CM | POA: Diagnosis not present

## 2022-01-10 DIAGNOSIS — E1142 Type 2 diabetes mellitus with diabetic polyneuropathy: Secondary | ICD-10-CM | POA: Diagnosis not present

## 2022-01-10 DIAGNOSIS — R202 Paresthesia of skin: Secondary | ICD-10-CM | POA: Diagnosis not present

## 2022-01-14 DIAGNOSIS — I509 Heart failure, unspecified: Secondary | ICD-10-CM | POA: Diagnosis not present

## 2022-01-14 DIAGNOSIS — J449 Chronic obstructive pulmonary disease, unspecified: Secondary | ICD-10-CM | POA: Diagnosis not present

## 2022-01-20 ENCOUNTER — Other Ambulatory Visit: Payer: Self-pay | Admitting: "Endocrinology

## 2022-01-20 ENCOUNTER — Other Ambulatory Visit: Payer: Self-pay | Admitting: Internal Medicine

## 2022-01-20 DIAGNOSIS — E7849 Other hyperlipidemia: Secondary | ICD-10-CM

## 2022-01-20 DIAGNOSIS — I10 Essential (primary) hypertension: Secondary | ICD-10-CM

## 2022-01-24 DIAGNOSIS — E119 Type 2 diabetes mellitus without complications: Secondary | ICD-10-CM | POA: Diagnosis not present

## 2022-01-24 DIAGNOSIS — E1165 Type 2 diabetes mellitus with hyperglycemia: Secondary | ICD-10-CM | POA: Diagnosis not present

## 2022-01-25 ENCOUNTER — Other Ambulatory Visit: Payer: Self-pay | Admitting: "Endocrinology

## 2022-01-25 DIAGNOSIS — E119 Type 2 diabetes mellitus without complications: Secondary | ICD-10-CM

## 2022-02-06 ENCOUNTER — Encounter: Payer: Self-pay | Admitting: Internal Medicine

## 2022-02-06 ENCOUNTER — Ambulatory Visit (INDEPENDENT_AMBULATORY_CARE_PROVIDER_SITE_OTHER): Payer: Medicare Other | Admitting: Internal Medicine

## 2022-02-06 ENCOUNTER — Other Ambulatory Visit (HOSPITAL_COMMUNITY): Payer: Self-pay | Admitting: Internal Medicine

## 2022-02-06 ENCOUNTER — Other Ambulatory Visit: Payer: Self-pay | Admitting: Nurse Practitioner

## 2022-02-06 VITALS — BP 124/62 | HR 107 | Resp 18 | Ht 60.0 in | Wt 200.4 lb

## 2022-02-06 DIAGNOSIS — Z794 Long term (current) use of insulin: Secondary | ICD-10-CM | POA: Diagnosis not present

## 2022-02-06 DIAGNOSIS — E119 Type 2 diabetes mellitus without complications: Secondary | ICD-10-CM

## 2022-02-06 DIAGNOSIS — N6310 Unspecified lump in the right breast, unspecified quadrant: Secondary | ICD-10-CM

## 2022-02-06 DIAGNOSIS — Z23 Encounter for immunization: Secondary | ICD-10-CM | POA: Diagnosis not present

## 2022-02-06 DIAGNOSIS — I10 Essential (primary) hypertension: Secondary | ICD-10-CM | POA: Diagnosis not present

## 2022-02-06 DIAGNOSIS — J9611 Chronic respiratory failure with hypoxia: Secondary | ICD-10-CM

## 2022-02-06 DIAGNOSIS — N611 Abscess of the breast and nipple: Secondary | ICD-10-CM

## 2022-02-06 DIAGNOSIS — N649 Disorder of breast, unspecified: Secondary | ICD-10-CM | POA: Diagnosis not present

## 2022-02-06 DIAGNOSIS — I5032 Chronic diastolic (congestive) heart failure: Secondary | ICD-10-CM

## 2022-02-06 MED ORDER — CEPHALEXIN 500 MG PO CAPS: 500.0000 mg | ORAL_CAPSULE | Freq: Three times a day (TID) | ORAL | 0 refills | Status: DC

## 2022-02-06 NOTE — Assessment & Plan Note (Signed)
BP Readings from Last 1 Encounters:  ?02/06/22 124/62  ? ?Well-controlled with Amlodipine and Irbesartan now ?Counseled for compliance with the medications ?Advised DASH diet and moderate exercise/walking, at least 150 mins/week ?

## 2022-02-06 NOTE — Assessment & Plan Note (Signed)
Uses 2 lpm O2 PRN ?Uses Anoro for COPD ?F/u Pulmonology ?Quit smoking in 2022, smoked about 0.5 pack/day for about 35 years ?

## 2022-02-06 NOTE — Assessment & Plan Note (Signed)
Right breast lesion, periareolar -erythematous ?Keflex for possible local cellulitis/abscess ?Will check diagnostic mammogram ?

## 2022-02-06 NOTE — Assessment & Plan Note (Signed)
Appears euvolemic ?Continue Lasix ?Follows up with Cardiologist ?

## 2022-02-06 NOTE — Assessment & Plan Note (Signed)
Diet modification and moderate exercise/walking advised. ?On Trulicity for diabetes ?

## 2022-02-06 NOTE — Patient Instructions (Signed)
Please take Cephalexin for breast lesion. ? ?Please continue to take other medications as prescribed. ?

## 2022-02-06 NOTE — Progress Notes (Signed)
? ?Established Patient Office Visit ? ?Subjective:  ?Patient ID: Christine Weaver, female    DOB: Sep 21, 1966  Age: 56 y.o. MRN: [id number] ? ?CC:  ?Chief Complaint  ?Patient presents with  ? Follow-up  ?  4 month follow up pt was feeling light headed and dizzy last week but feels better also pt has spot on right breast near nipple has been there awhile but its starting to hurt   ? ? ?HPI ?Christine Weaver is a 56 y.o. female with past medical history of COPD, chronic respiratory failure, HTN, HFpEF and type 2 DM who presents for f/u of her chronic medical conditions. ? ?She complains of right breast area pain around the nipple.  She also has noticed redness in the area, and states that she had severe pain over the area when she bumped with her washing machine.  She denies any fever, chills, or local discharge.  Her last mammography was unremarkable in 08/22. ? ?She complains of dizziness and mild fever in the last week.  Denies any cough, dyspnea, nausea, vomiting or diarrhea at the time.  She states that her dizziness and fever have resolved now.  She tried to keep herself hydrated and felt better later. ? ?HTN: BP is well-controlled. Takes medications regularly. Patient denies headache, dizziness, chest pain, dyspnea or palpitations. ?  ?HFpEF: She denies orthopnea or PND. Takes Lasix regularly. ?  ?COPD: On home O2 - 2 lpm PRN now, she is benefiting from oxygen usage during episodes of dyspnea/hypoxia. Uses Symbicort regularly. Denies any need to use Albuterol recently. Denies dyspnea or wheezing currently. ? ? ? ? ?Past Medical History:  ?Diagnosis Date  ? Asthma   ? COPD (chronic obstructive pulmonary disease) ([place])   ? Hyperlipidemia   ? Hypertension   ? Type 2 diabetes mellitus ([place])   ? ? ?Past Surgical History:  ?Procedure Laterality Date  ? CESAREAN SECTION    ? COLONOSCOPY WITH PROPOFOL N/A 12/16/2021  ? Procedure: COLONOSCOPY WITH PROPOFOL;  Surgeon: Harvel Quale, MD;  [facility];   Service: Gastroenterology;  Laterality: N/A;  945  ? HOT HEMOSTASIS  12/16/2021  ? Procedure: HOT HEMOSTASIS (ARGON PLASMA COAGULATION/BICAP);  Surgeon: Montez Morita, Quillian Quince, MD;  Location: [facility];  Service: Gastroenterology;;  ? POLYPECTOMY  12/16/2021  ? Procedure: POLYPECTOMY;  Surgeon: Harvel Quale, MD;  Location: [facility];  Service: Gastroenterology;;  ? SUBMUCOSAL TATTOO INJECTION  12/16/2021  ? Procedure: SUBMUCOSAL TATTOO INJECTION;  Surgeon: Harvel Quale, MD;  Location: [facility];  Service: Gastroenterology;;  ? ? ?Family History  ?Problem Relation Age of Onset  ? Thyroid disease Mother   ? Hypertension Mother   ? Heart attack Mother   ? Stroke Mother   ? Heart failure Mother   ? Diabetes Mother   ? Emphysema Mother   ? Cancer Mother   ? Diabetes Father   ? Heart attack Father   ? Stroke Father   ? Kidney disease Father   ? Heart failure Father   ? ? ?Social History  ? ?Socioeconomic History  ? Marital status: Widowed  ?  Spouse name: Not on file  ? Number of children: Not on file  ? Years of education: Not on file  ? Highest education level: Not on file  ?Occupational History  ? Not on file  ?Tobacco Use  ? Smoking status: Former  ?  Types: Cigarettes  ?  Quit date: 10/29/2020  ?  Years since quitting:  1.2  ? Smokeless tobacco: Never  ? Tobacco comments:  ?  Uses nicotine patches. Has not smoked since new years eve 2021-  06/30/21  ?Vaping Use  ? Vaping Use: Never used  ?Substance and Sexual Activity  ? Alcohol use: Never  ? Drug use: Never  ? Sexual activity: Not on file  ?Other Topics Concern  ? Not on file  ?Social History Narrative  ? Not on file  ? ?Social Determinants of Health  ? ?Financial Resource Strain: Low Risk   ? Difficulty of Paying Living Expenses: Not hard at all  ?Food Insecurity: No Food Insecurity  ? Worried About Charity fundraiser in the Last Year: Never true  ? Ran Out of Food in the Last Year: Never true  ?Transportation Needs: No  Transportation Needs  ? Lack of Transportation (Medical): No  ? Lack of Transportation (Non-Medical): No  ?Physical Activity: Inactive  ? Days of Exercise per Week: 0 days  ? Minutes of Exercise per Session: 0 min  ?Stress: No Stress Concern Present  ? Feeling of Stress : Not at all  ?Social Connections: Socially Isolated  ? Frequency of Communication with Friends and Family: More than three times a week  ? Frequency of Social Gatherings with Friends and Family: Never  ? Attends Religious Services: Never  ? Active Member of Clubs or Organizations: No  ? Attends Archivist Meetings: Never  ? Marital Status: Widowed  ?Intimate Partner Violence: Not At Risk  ? Fear of Current or Ex-Partner: No  ? Emotionally Abused: No  ? Physically Abused: No  ? Sexually Abused: No  ? ? ?Outpatient Medications Prior to Visit  ?Medication Sig Dispense Refill  ? albuterol (PROVENTIL) (2.5 MG/3ML) 0.083% nebulizer solution Take 2.5 mg by nebulization every 4 (four) hours as needed for wheezing or shortness of breath.    ? albuterol (VENTOLIN HFA) 108 (90 Base) MCG/ACT inhaler Inhale 2 puffs into the lungs every 4 (four) hours as needed for wheezing or shortness of breath. 18 g 5  ? amLODipine (NORVASC) 10 MG tablet TAKE 1 TABLET BY MOUTH EVERY DAY 90 tablet 1  ? ANORO ELLIPTA 62.5-25 MCG/ACT AEPB TAKE 1 PUFF BY MOUTH EVERY DAY 60 each 5  ? atorvastatin (LIPITOR) 80 MG tablet TAKE 1 TABLET BY MOUTH EVERY DAY 90 tablet 1  ? BD PEN NEEDLE NANO 2ND GEN 32G X 4 MM MISC USE 1 PEN NEEDLE FOR 6 INJECTIONS DAILY FOR DIABETES E11.9 200 each 11  ? budesonide-formoterol (SYMBICORT) 80-4.5 MCG/ACT inhaler Take 2 puffs first thing in am and then another 2 puffs about 12 hours later. 1 each 12  ? cholecalciferol (VITAMIN D3) 25 MCG (1000 UNIT) tablet Take 1,000 Units by mouth daily.    ? Continuous Blood Gluc Receiver (FREESTYLE LIBRE 2 READER) DEVI As directed 1 each 0  ? famotidine (PEPCID) 20 MG tablet One after supper (Patient taking  differently: Take 20 mg by mouth every evening. One after supper) 30 tablet 11  ? fluticasone (FLONASE) 50 MCG/ACT nasal spray Place into both nostrils daily.    ? furosemide (LASIX) 20 MG tablet TAKE 1 TABLET BY MOUTH TWICE A DAY 180 tablet 3  ? glucose blood (ACCU-CHEK AVIVA PLUS) test strip     ? HYDROcodone-acetaminophen (NORCO/VICODIN) 5-325 MG tablet Take 1 tablet by mouth every 12 (twelve) hours as needed for severe pain.    ? insulin glargine (LANTUS SOLOSTAR) 100 UNIT/ML Solostar Pen Inject 80 Units into the skin  at bedtime. 30 mL 1  ? insulin lispro (HUMALOG KWIKPEN) 100 UNIT/ML KwikPen Inject 10 Units into the skin 3 (three) times daily before meals. 15 mL 1  ? irbesartan (AVAPRO) 75 MG tablet Take 1 tablet (75 mg total) by mouth daily. 30 tablet 11  ? liraglutide (VICTOZA) 18 MG/3ML SOPN INJECT 1.8 MG UNDER THE SKIN ONCE DAILY 9 mL 0  ? metFORMIN (GLUCOPHAGE) 1000 MG tablet TAKE 1 TABLET (1,000 MG TOTAL) BY MOUTH TWICE A DAY WITH FOOD 180 tablet 0  ? nicotine (NICODERM CQ - DOSED IN MG/24 HOURS) 21 mg/24hr patch Place 21 mg onto the skin daily as needed (nicotine).    ? nystatin cream (MYCOSTATIN) Apply 1 application topically 2 (two) times daily.    ? omeprazole (PRILOSEC) 40 MG capsule TAKE 1 CAPSULE (40 MG TOTAL) BY MOUTH DAILY. 90 capsule 1  ? ?No facility-administered medications prior to visit.  ? ? ?Allergies  ?Allergen Reactions  ? Morphine And Related Itching  ? Other Other (See Comments)  ?  Bells Palsy ?  ? Oxycodone-Acetaminophen Other (See Comments)  ?  Constipates patient ?  ? Latex Itching and Other (See Comments)  ?  Skin "cracks" open ?  ? ? ?ROS ?Review of Systems  ?Constitutional:  Negative for chills and fever.  ?HENT:  Negative for congestion, sinus pressure, sinus pain and sore throat.   ?Eyes:  Negative for pain and discharge.  ?Respiratory:  Negative for cough, shortness of breath and wheezing.   ?Cardiovascular:  Negative for chest pain and palpitations.  ?Gastrointestinal:   Negative for abdominal pain, constipation, diarrhea, nausea and vomiting.  ?Endocrine: Negative for polydipsia and polyuria.  ?Genitourinary:  Negative for dysuria and hematuria.  ?Musculoskeletal:  Positive for arthralgi

## 2022-02-06 NOTE — Assessment & Plan Note (Signed)
Lab Results  ?Component Value Date  ? HGBA1C 7.8 (H) 10/13/2021  ? ?Takes Lantus 80 U qHS, Humalog 14-20 U TID according to sliding scale ?Victoza 1.8 mg QD ?Metformin 1000 mg BID ?Follows up with Dr Dorris Fetch ?On statin and ACEi ?Referred to Ophthalmology for diabetic eye exam ?

## 2022-02-07 DIAGNOSIS — M5431 Sciatica, right side: Secondary | ICD-10-CM | POA: Diagnosis not present

## 2022-02-07 DIAGNOSIS — E1142 Type 2 diabetes mellitus with diabetic polyneuropathy: Secondary | ICD-10-CM | POA: Diagnosis not present

## 2022-02-07 DIAGNOSIS — G8929 Other chronic pain: Secondary | ICD-10-CM | POA: Diagnosis not present

## 2022-02-07 DIAGNOSIS — G894 Chronic pain syndrome: Secondary | ICD-10-CM | POA: Diagnosis not present

## 2022-02-07 DIAGNOSIS — R202 Paresthesia of skin: Secondary | ICD-10-CM | POA: Diagnosis not present

## 2022-02-07 DIAGNOSIS — M5432 Sciatica, left side: Secondary | ICD-10-CM | POA: Diagnosis not present

## 2022-02-07 DIAGNOSIS — M546 Pain in thoracic spine: Secondary | ICD-10-CM | POA: Diagnosis not present

## 2022-02-07 DIAGNOSIS — M542 Cervicalgia: Secondary | ICD-10-CM | POA: Diagnosis not present

## 2022-02-07 DIAGNOSIS — M545 Low back pain, unspecified: Secondary | ICD-10-CM | POA: Diagnosis not present

## 2022-02-07 DIAGNOSIS — Z79891 Long term (current) use of opiate analgesic: Secondary | ICD-10-CM | POA: Diagnosis not present

## 2022-02-09 ENCOUNTER — Other Ambulatory Visit: Payer: Self-pay

## 2022-02-09 DIAGNOSIS — R809 Proteinuria, unspecified: Secondary | ICD-10-CM

## 2022-02-09 LAB — MICROALBUMIN / CREATININE URINE RATIO
Creatinine, Urine: 166.2 mg/dL
Microalb/Creat Ratio: 1884 mg/g creat — ABNORMAL HIGH (ref 0–29)
Microalbumin, Urine: 3131.8 ug/mL

## 2022-02-14 DIAGNOSIS — J449 Chronic obstructive pulmonary disease, unspecified: Secondary | ICD-10-CM | POA: Diagnosis not present

## 2022-02-14 DIAGNOSIS — I509 Heart failure, unspecified: Secondary | ICD-10-CM | POA: Diagnosis not present

## 2022-02-23 ENCOUNTER — Ambulatory Visit (HOSPITAL_COMMUNITY)
Admission: RE | Admit: 2022-02-23 | Discharge: 2022-02-23 | Disposition: A | Discharge status: Home / Self Care | Payer: Medicare Other | Source: Ambulatory Visit | Attending: Internal Medicine | Admitting: Internal Medicine

## 2022-02-23 DIAGNOSIS — N6310 Unspecified lump in the right breast, unspecified quadrant: Secondary | ICD-10-CM | POA: Insufficient documentation

## 2022-02-23 DIAGNOSIS — N649 Disorder of breast, unspecified: Secondary | ICD-10-CM | POA: Insufficient documentation

## 2022-02-23 DIAGNOSIS — R928 Other abnormal and inconclusive findings on diagnostic imaging of breast: Secondary | ICD-10-CM | POA: Diagnosis not present

## 2022-02-23 DIAGNOSIS — N6489 Other specified disorders of breast: Secondary | ICD-10-CM | POA: Diagnosis not present

## 2022-02-24 DIAGNOSIS — E1165 Type 2 diabetes mellitus with hyperglycemia: Secondary | ICD-10-CM | POA: Diagnosis not present

## 2022-02-24 DIAGNOSIS — E119 Type 2 diabetes mellitus without complications: Secondary | ICD-10-CM | POA: Diagnosis not present

## 2022-03-01 ENCOUNTER — Other Ambulatory Visit: Payer: Self-pay | Admitting: "Endocrinology

## 2022-03-01 DIAGNOSIS — E119 Type 2 diabetes mellitus without complications: Secondary | ICD-10-CM

## 2022-03-06 ENCOUNTER — Ambulatory Visit (INDEPENDENT_AMBULATORY_CARE_PROVIDER_SITE_OTHER): Payer: Medicare Other | Admitting: Internal Medicine

## 2022-03-06 ENCOUNTER — Encounter: Payer: Self-pay | Admitting: Internal Medicine

## 2022-03-06 VITALS — BP 142/70 | HR 80 | Temp 97.7°F | Ht 60.0 in | Wt 206.4 lb

## 2022-03-06 DIAGNOSIS — Z720 Tobacco use: Secondary | ICD-10-CM

## 2022-03-06 DIAGNOSIS — J9611 Chronic respiratory failure with hypoxia: Secondary | ICD-10-CM | POA: Diagnosis not present

## 2022-03-06 DIAGNOSIS — R490 Dysphonia: Secondary | ICD-10-CM | POA: Insufficient documentation

## 2022-03-06 DIAGNOSIS — J449 Chronic obstructive pulmonary disease, unspecified: Secondary | ICD-10-CM

## 2022-03-06 NOTE — Assessment & Plan Note (Addendum)
Quit smoking 09/2020/MM ?- try off acei 06/30/2021  ?- change advair to symb 80 2bid and max rx for gerd  ?- alpha one  06/30/21    MM   Level  142 ?- Allergy profile 06/30/21  >  Eos 0.1 /  IgE  124 ? ?- 03/06/2022  After extensive coaching inhaler device,  effectiveness =  75%   ? ?Not clear how much is copd/ab or simply deconditioning at this point > pft's ordered again ? ?Low-dose CT lung cancer screening is recommended for patients who ?are 42-54 years of age with a 20+ pack-year history of smoking and ?who are currently smoking or quit <=15 years ago. ?No coughing up blood  ?No unintentional weight loss of > 15 pounds in the last 6 months  ?>>> elible until 2036 for annual screening > referred ? ? Discussed in detail all the  indications, usual  risks and alternatives  relative to the benefits with patient who agrees to proceed with w/u as outlined.    ? ?

## 2022-03-06 NOTE — Assessment & Plan Note (Signed)
Referred to ENT 03/06/2022 >>>  ? 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  ? ?Discussed in detail all the  indications, usual  risks and alternatives  relative to the benefits with patient who agrees to proceed with w/u as outlined.    ? ?    ?  ? ?Each maintenance medication was reviewed in detail including emphasizing most importantly the difference between maintenance and prns and under what circumstances the prns are to be triggered using an action plan format where appropriate. ? ?Total time for H and P, chart review, counseling, reviewing hfa device(s) and generating customized AVS unique to this office visit / same day charting  > 30 min  ?     ?

## 2022-03-06 NOTE — Progress Notes (Signed)
? ?Christine Weaver, female    DOB: 12-Sep-1966,   MRN: [id number] ? ? 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 ? ?Quit work 2004 at 89  ? ?After [facility] 2012 saw [facility] doctor feeling sluggish and dx with asthma > meds didn't help much then started doe which seemed better with alb neb then added advair around 2014/15 then 02   ? ? ?History of Present Illness  ?06/30/2021  Pulmonary/ 1st office eval/ Christine Weaver / [facility]  ?Chief Complaint  ?Patient presents with  ? Follow-up  ?  Pt. Is on 2L O2 when sleeping and out running errands but tries not to wear it in the house unless she needs it. Coughing but not normally coughing up mucus.   ?Dyspnea:  foodlion 50% using 02 2lpm/  ?Cough: always coughing some very hoarse x years >minimally productive  ?Sleep: 45 degrees with pillows  ?SABA use: no longer needing since 02  ?02 :  2lpm at bedtime and prn daytime  ?Intol of dpi  ?Rec ?Omeprazole 40 mg Take  30-60 min before first meal of the day and Pepcid (famotidine)  20 mg after supper until return to office - this is the best way to tell whether stomach acid is contributing to your problem.   ?GERD rx ?Stop advair and start symbicort 80 Take 2 puffs first thing in am and then another 2 puffs about 12 hours later.  ?Work on inhaler technique: .  ?Make sure you check your oxygen saturation  at your highest level of activity   ?Please schedule a follow up office visit in 6-8  weeks, call sooner if needed with pfts on return ? ? ?08/30/2021  f/u ov/[place] office/Christine Weaver re:  maint on symbicort 80 2bid     ?Chief Complaint  ?Patient presents with  ? Follow-up  ?  2 lpm all of the time. Persistent dry cough. Feels inhaler is helping.   ?Dyspnea:   push mower x 20 min s 02  says after stopped 89% but not checking  while exerting  ?Cough: dry  ?Sleeping: flat bed 45  degrees ?SABA use: minimal  ?02: 2lpm hs and prn  ?Covid status: vax 3  ?Lung cancer screening: referred  ?Rec ?We will have our lung screening nurse practioner call you to set  up follow up CT not due til Feb 2023 ?Make sure you check your oxygen saturation  at your highest level of activity  to be sure it stays over 90% ?Keep your appt for your PFTs  ?Please schedule a follow up visit in 6  months but call sooner if needed  ?Late add not on amlodipine or lisinopril so started avapro 75 mg daily  ? ? ? ?03/06/2022  f/u ov/[place] office/Christine Weaver re: COPD/AB maint on symbicort 80 2bid   ?Chief Complaint  ?Patient presents with  ? Follow-up  ?  Since allergy season has started patient has been having to use portable O2 while out   ?Dyspnea:  says can walk the whole store@ [employer] or [employer] / mall walking slower than others = MMRC2 = can't walk a nl pace on a flat grade s sob but does fine slow and flat eg  ?Cough: min mucoid at hs but not does not keep her up once asleep ?Sleeping: 45 degrees with pillows / sleeps with cat  ?SABA  use: up to 3 x daily  due to "allergy season" ?02: 2lpm NP when  "cat wakes her up because 02 drops"  ?Covid status: x 3  ?Lung cancer screening: due  ? ? ?No obvious day to day or daytime variability or assoc excess/ purulent sputum or mucus plugs or hemoptysis or cp or chest tightness, subjective wheeze or overt sinus or hb symptoms.  ?  ? ?Also denies any obvious fluctuation of symptoms with weather or environmental changes or other aggravating or alleviating factors except as outlined above  ? ?No unusual exposure hx or h/o childhood pna/ asthma or knowledge of premature birth. ? ?Current Allergies, Complete Past Medical History, Past Surgical History, Family History, and Social History were reviewed in [employer] record. ? ?ROS  The following are not active complaints unless bolded ?Hoarseness, sore throat, dysphagia, dental problems, itching, sneezing,  nasal  congestion or discharge of excess mucus or purulent secretions, ear ache,   fever, chills, sweats, unintended wt loss or wt gain, classically pleuritic or exertional cp,  orthopnea pnd or arm/hand swelling  or leg swelling, presyncope, palpitations, abdominal pain, anorexia, nausea, vomiting, diarrhea  or change in bowel habits or change in bladder habits, change in stools or change in urine, dysuria, hematuria,  rash, arthralgias, visual complaints, headache, numbness, weakness or ataxia or problems with walking or coordination,  change in mood or  memory. ?      ? ?Current Meds  ?Medication Sig  ? albuterol (PROVENTIL) (2.5 MG/3ML) 0.083% nebulizer solution Take 2.5 mg by nebulization every 4 (four) hours as needed for wheezing or shortness of breath.  ? albuterol (VENTOLIN HFA) 108 (90 Base) MCG/ACT inhaler Inhale 2 puffs into the lungs every 4 (four) hours as needed for wheezing or shortness of breath.  ? amLODipine (NORVASC) 10 MG tablet TAKE 1 TABLET BY MOUTH EVERY DAY  ? ANORO ELLIPTA 62.5-25 MCG/ACT AEPB TAKE 1 PUFF BY MOUTH EVERY DAY  ? atorvastatin (LIPITOR) 80 MG tablet TAKE 1 TABLET BY MOUTH EVERY DAY  ? BD PEN NEEDLE NANO 2ND GEN 32G X 4 MM MISC USE 1 PEN NEEDLE FOR 6 INJECTIONS DAILY FOR DIABETES E11.9  ? budesonide-formoterol (SYMBICORT) 80-4.5 MCG/ACT inhaler Take 2 puffs first thing in am and then another 2 puffs about 12 hours later.  ? cephALEXin (KEFLEX) 500 MG capsule Take 1 capsule (500 mg total) by mouth 3 (three) times daily.  ? cholecalciferol (VITAMIN D3) 25 MCG (1000 UNIT) tablet Take 1,000 Units by mouth daily.  ? Continuous Blood Gluc Receiver (FREESTYLE LIBRE 2 READER) DEVI As directed  ? famotidine (PEPCID) 20 MG tablet One after supper (Patient taking differently: Take 20 mg by mouth every evening. One after supper)  ? fluticasone (FLONASE) 50 MCG/ACT nasal spray Place into both nostrils daily.  ? furosemide (LASIX) 20 MG tablet TAKE 1 TABLET BY MOUTH TWICE A DAY  ? glucose blood  (ACCU-CHEK AVIVA PLUS) test strip   ? HYDROcodone-acetaminophen (NORCO/VICODIN) 5-325 MG tablet Take 1 tablet by mouth every 12 (twelve) hours as needed for severe pain.  ? insulin glargine (LANTUS SOLOSTAR) 100 UNIT/ML Solostar Pen Inject 80 Units into the skin at bedtime.  ? insulin lispro (HUMALOG KWIKPEN) 100 UNIT/ML KwikPen Inject 10 Units into the skin 3 (three) times daily before meals.  ? irbesartan (AVAPRO) 75 MG tablet Take 1 tablet (75 mg total) by mouth daily.  ? metFORMIN (GLUCOPHAGE) 1000 MG tablet TAKE 1 TABLET (1,000 MG TOTAL) BY MOUTH TWICE A  DAY WITH FOOD  ? nicotine (NICODERM CQ - DOSED IN MG/24 HOURS) 21 mg/24hr patch Place 21 mg onto the skin daily as needed (nicotine).  ? nystatin cream (MYCOSTATIN) Apply 1 application topically 2 (two) times daily.  ? omeprazole (PRILOSEC) 40 MG capsule TAKE 1 CAPSULE (40 MG TOTAL) BY MOUTH DAILY.  ? VICTOZA 18 MG/3ML SOPN INJECT 1.8 MG UNDER THE SKIN ONCE DAILY  ?     ?  ? ?  ? ?Past Medical History:  ?Diagnosis Date  ? Asthma   ? COPD (chronic obstructive pulmonary disease) ([place])   ? Hyperlipidemia   ? Hypertension   ? Type 2 diabetes mellitus ([place])   ? ? ?  ? ? ?Objective:  ?  ? ?03/06/2022         206   ?08/30/21 212 lb (96.2 kg)  ?07/11/21 209 lb 6.4 oz (95 kg)  ?06/30/21 210 lb 1.6 oz (95.3 kg)  ?  ?Vital signs reviewed  03/06/2022  - Note at rest 02 sats  96% on Ra  ? ?General appearance:    AMBHoarse Obese wf unusual affect/ responses (see cat comments above) and mild pseudowheeze  ? ?HEENT : nl exam   ? ?NECK :  without JVD/Nodes/TM/ nl carotid upstrokes bilaterally ? ? ?LUNGS: no acc muscle use,  Min barrel  contour chest wall with bilateral  slightly decreased bs s audible wheeze and  without cough on insp or exp maneuvers and min  Hyperresonant  to  percussion bilaterally   ? ? ?CV:  RRR  no s3 or murmur or increase in P2, and no edema  ? ?ABD:  soft and nontender with pos end  insp Hoover's  in the supine position. No bruits or organomegaly  appreciated, bowel sounds nl ? ?MS:   Nl gait/  ext warm without deformities, calf tenderness, cyanosis or clubbing ?No obvious joint restrictions  ? ?SKIN: warm and dry without lesions   ? ?NEURO:  alert, approp, nl sensorium

## 2022-03-06 NOTE — Assessment & Plan Note (Signed)
sats 97% at rest 08/30/2021   ?- 03/06/2022 02 sats at rest 96% and using prn hs and daytime ? ?Advised: ?Make sure you check your oxygen saturation  AT  your highest level of activity (not after you stop)   to be sure it stays over 90% and adjust  02 flow upward to maintain this level if needed but remember to turn it back to previous settings when you stop (to conserve your supply).  ?

## 2022-03-06 NOTE — Patient Instructions (Addendum)
I will be referring you to ENT for chronic hoarseness  ? ?We will be calling to arrange for you to start lung cancer screening  ? ?We will be scheduling PFTs  in [place] either when you go for ENT or pain management  ? ?Work on inhaler technique:  relax and gently blow all the way out then take a nice smooth full deep breath back in, triggering the inhaler at same time you start breathing in.  Hold for up to 5 seconds if you can. Blow symbicort out thru nose. Rinse and gargle with water when done.  If mouth or throat bother you at all,  try brushing teeth/gums/tongue with arm and hammer toothpaste/ make a slurry and gargle and spit out.  ? ? ?Please schedule a follow up visit in 6 months but call sooner if needed  ? ?   ? ? ? ?

## 2022-03-07 ENCOUNTER — Encounter: Payer: Self-pay | Admitting: "Endocrinology

## 2022-03-07 ENCOUNTER — Ambulatory Visit: Payer: Commercial Managed Care - HMO | Admitting: "Endocrinology

## 2022-03-07 ENCOUNTER — Ambulatory Visit (INDEPENDENT_AMBULATORY_CARE_PROVIDER_SITE_OTHER): Payer: Medicare Other | Admitting: "Endocrinology

## 2022-03-07 VITALS — BP 132/68 | HR 100 | Ht 60.0 in | Wt 206.0 lb

## 2022-03-07 DIAGNOSIS — Z794 Long term (current) use of insulin: Secondary | ICD-10-CM | POA: Diagnosis not present

## 2022-03-07 DIAGNOSIS — M5432 Sciatica, left side: Secondary | ICD-10-CM | POA: Diagnosis not present

## 2022-03-07 DIAGNOSIS — E119 Type 2 diabetes mellitus without complications: Secondary | ICD-10-CM

## 2022-03-07 DIAGNOSIS — Z79891 Long term (current) use of opiate analgesic: Secondary | ICD-10-CM | POA: Diagnosis not present

## 2022-03-07 DIAGNOSIS — I10 Essential (primary) hypertension: Secondary | ICD-10-CM | POA: Diagnosis not present

## 2022-03-07 DIAGNOSIS — M5136 Other intervertebral disc degeneration, lumbar region: Secondary | ICD-10-CM | POA: Diagnosis not present

## 2022-03-07 DIAGNOSIS — G894 Chronic pain syndrome: Secondary | ICD-10-CM | POA: Diagnosis not present

## 2022-03-07 DIAGNOSIS — E782 Mixed hyperlipidemia: Secondary | ICD-10-CM

## 2022-03-07 DIAGNOSIS — M5431 Sciatica, right side: Secondary | ICD-10-CM | POA: Diagnosis not present

## 2022-03-07 DIAGNOSIS — M542 Cervicalgia: Secondary | ICD-10-CM | POA: Diagnosis not present

## 2022-03-07 DIAGNOSIS — E1142 Type 2 diabetes mellitus with diabetic polyneuropathy: Secondary | ICD-10-CM | POA: Diagnosis not present

## 2022-03-07 DIAGNOSIS — G8929 Other chronic pain: Secondary | ICD-10-CM | POA: Diagnosis not present

## 2022-03-07 DIAGNOSIS — M545 Low back pain, unspecified: Secondary | ICD-10-CM | POA: Diagnosis not present

## 2022-03-07 DIAGNOSIS — R202 Paresthesia of skin: Secondary | ICD-10-CM | POA: Diagnosis not present

## 2022-03-07 LAB — POCT GLYCOSYLATED HEMOGLOBIN (HGB A1C): HbA1c, POC (controlled diabetic range): 7.4 % — AB (ref 0.0–7.0)

## 2022-03-07 MED ORDER — METFORMIN HCL 500 MG PO TABS: 500.0000 mg | ORAL_TABLET | Freq: Two times a day (BID) | ORAL | 1 refills | Status: DC

## 2022-03-07 NOTE — Patient Instructions (Signed)

## 2022-03-07 NOTE — Progress Notes (Signed)
? ? ?     03/07/2022, 6:56 PM ? ?Endocrinology follow-up note ? ? ?Subjective:  ? ? Patient ID: Christine Weaver, female    DOB: 1966/08/26.  ?Dusty Horiuchi is being seen in follow-up after she was seen in consultation for management of currently uncontrolled symptomatic diabetes requested by  Lindell Spar, MD. ? ? ?Past Medical History:  ?Diagnosis Date  ? Asthma   ? COPD (chronic obstructive pulmonary disease) ([place])   ? Hyperlipidemia   ? Hypertension   ? Type 2 diabetes mellitus ([place])   ? ? ?Past Surgical History:  ?Procedure Laterality Date  ? CESAREAN SECTION    ? COLONOSCOPY WITH PROPOFOL N/A 12/16/2021  ? Procedure: COLONOSCOPY WITH PROPOFOL;  Surgeon: Harvel Quale, MD;  [facility];  Service: Gastroenterology;  Laterality: N/A;  945  ? HOT HEMOSTASIS  12/16/2021  ? Procedure: HOT HEMOSTASIS (ARGON PLASMA COAGULATION/BICAP);  Surgeon: Montez Morita, Quillian Quince, MD;  Location: [facility];  Service: Gastroenterology;;  ? POLYPECTOMY  12/16/2021  ? Procedure: POLYPECTOMY;  Surgeon: Harvel Quale, MD;  [facility];  Service: Gastroenterology;;  ? SUBMUCOSAL TATTOO INJECTION  12/16/2021  ? Procedure: SUBMUCOSAL TATTOO INJECTION;  Surgeon: Harvel Quale, MD;  [facility];  Service: Gastroenterology;;  ? ? ?Social History  ? ?Socioeconomic History  ? Marital status: Widowed  ?  Spouse name: Not on file  ? Number of children: Not on file  ? Years of education: Not on file  ? Highest education level: Not on file  ?Occupational History  ? Not on file  ?Tobacco Use  ? Smoking status: Former  ?  Types: Cigarettes  ?  Quit date: 10/29/2020  ?  Years since quitting: 1.3  ? Smokeless tobacco: Never  ? Tobacco comments:  ?  Uses nicotine patches. Has not smoked since new years eve 2021-  06/30/21  ?Vaping Use  ? Vaping Use: Never used  ?Substance and Sexual Activity  ? Alcohol use: Never  ? Drug use: Never  ? Sexual activity: Not on file   ?Other Topics Concern  ? Not on file  ?Social History Narrative  ? Not on file  ? ?Social Determinants of Health  ? ?Financial Resource Strain: Low Risk   ? Difficulty of Paying Living Expenses: Not hard at all  ?Food Insecurity: No Food Insecurity  ? Worried About Charity fundraiser in the Last Year: Never true  ? Ran Out of Food in the Last Year: Never true  ?Transportation Needs: No Transportation Needs  ? Lack of Transportation (Medical): No  ? Lack of Transportation (Non-Medical): No  ?Physical Activity: Inactive  ? Days of Exercise per Week: 0 days  ? Minutes of Exercise per Session: 0 min  ?Stress: No Stress Concern Present  ? Feeling of Stress : Not at all  ?Social Connections: Socially Isolated  ? Frequency of Communication with Friends and Family: More than three times a week  ? Frequency of Social Gatherings with Friends and Family: Never  ? Attends Religious Services: Never  ? Active Member of Clubs or Organizations: No  ? Attends Archivist Meetings: Never  ? Marital Status: Widowed  ? ? ?Family History  ?Problem Relation Age of Onset  ? Thyroid disease Mother   ? Hypertension Mother   ? Heart attack Mother   ? Stroke Mother   ? Heart failure Mother   ? Diabetes Mother   ? Emphysema Mother   ?  Cancer Mother   ? Diabetes Father   ? Heart attack Father   ? Stroke Father   ? Kidney disease Father   ? Heart failure Father   ? ? ?Outpatient Encounter Medications as of 03/07/2022  ?Medication Sig  ? albuterol (PROVENTIL) (2.5 MG/3ML) 0.083% nebulizer solution Take 2.5 mg by nebulization every 4 (four) hours as needed for wheezing or shortness of breath.  ? albuterol (VENTOLIN HFA) 108 (90 Base) MCG/ACT inhaler Inhale 2 puffs into the lungs every 4 (four) hours as needed for wheezing or shortness of breath.  ? amLODipine (NORVASC) 10 MG tablet TAKE 1 TABLET BY MOUTH EVERY DAY  ? ANORO ELLIPTA 62.5-25 MCG/ACT AEPB TAKE 1 PUFF BY MOUTH EVERY DAY  ? atorvastatin (LIPITOR) 80 MG tablet TAKE 1 TABLET BY  MOUTH EVERY DAY  ? BD PEN NEEDLE NANO 2ND GEN 32G X 4 MM MISC USE 1 PEN NEEDLE FOR 6 INJECTIONS DAILY FOR DIABETES E11.9  ? budesonide-formoterol (SYMBICORT) 80-4.5 MCG/ACT inhaler Take 2 puffs first thing in am and then another 2 puffs about 12 hours later.  ? cephALEXin (KEFLEX) 500 MG capsule Take 1 capsule (500 mg total) by mouth 3 (three) times daily.  ? cholecalciferol (VITAMIN D3) 25 MCG (1000 UNIT) tablet Take 1,000 Units by mouth daily.  ? Continuous Blood Gluc Receiver (FREESTYLE LIBRE 2 READER) DEVI As directed  ? famotidine (PEPCID) 20 MG tablet One after supper (Patient taking differently: Take 20 mg by mouth every evening. One after supper)  ? fluticasone (FLONASE) 50 MCG/ACT nasal spray Place into both nostrils daily.  ? furosemide (LASIX) 20 MG tablet TAKE 1 TABLET BY MOUTH TWICE A DAY  ? glucose blood (ACCU-CHEK AVIVA PLUS) test strip   ? HYDROcodone-acetaminophen (NORCO/VICODIN) 5-325 MG tablet Take 1 tablet by mouth every 12 (twelve) hours as needed for severe pain.  ? insulin glargine (LANTUS SOLOSTAR) 100 UNIT/ML Solostar Pen Inject 80 Units into the skin at bedtime.  ? insulin lispro (HUMALOG KWIKPEN) 100 UNIT/ML KwikPen Inject 10 Units into the skin 3 (three) times daily before meals.  ? irbesartan (AVAPRO) 75 MG tablet Take 1 tablet (75 mg total) by mouth daily.  ? metFORMIN (GLUCOPHAGE) 500 MG tablet Take 1 tablet (500 mg total) by mouth 2 (two) times daily with a meal. Skip until you see the Kidney doctor  ? nicotine (NICODERM CQ - DOSED IN MG/24 HOURS) 21 mg/24hr patch Place 21 mg onto the skin daily as needed (nicotine).  ? nystatin cream (MYCOSTATIN) Apply 1 application topically 2 (two) times daily.  ? omeprazole (PRILOSEC) 40 MG capsule TAKE 1 CAPSULE (40 MG TOTAL) BY MOUTH DAILY.  ? VICTOZA 18 MG/3ML SOPN INJECT 1.8 MG UNDER THE SKIN ONCE DAILY  ? [DISCONTINUED] metFORMIN (GLUCOPHAGE) 1000 MG tablet TAKE 1 TABLET (1,000 MG TOTAL) BY MOUTH TWICE A DAY WITH FOOD  ? ?No  facility-administered encounter medications on file as of 03/07/2022.  ? ? ?ALLERGIES: ?Allergies  ?Allergen Reactions  ? Morphine And Related Itching  ? Other Other (See Comments)  ?  Bells Palsy ?  ? Oxycodone-Acetaminophen Other (See Comments)  ?  Constipates patient ?  ? Latex Itching and Other (See Comments)  ?  Skin "cracks" open ?  ? ? ?VACCINATION STATUS: ?Immunization History  ?Administered Date(s) Administered  ? Moderna SARS-COV2 Booster Vaccination 09/03/2021  ? Moderna Sars-Covid-2 Vaccination 12/11/2019, 11/13/2020  ? PNEUMOCOCCAL CONJUGATE-20 10/06/2021  ? Tdap 03/13/2018  ? Zoster Recombinat (Shingrix) 10/06/2021, 02/06/2022  ? ? ?Diabetes ?She presents  for her follow-up diabetic visit. She has type 2 diabetes mellitus. Onset time: She was diagnosed at approximately age of 34 years. Her disease course has been improving. There are no hypoglycemic associated symptoms. Pertinent negatives for hypoglycemia include no confusion, headaches, pallor or seizures. Pertinent negatives for diabetes include no chest pain, no fatigue, no polydipsia, no polyphagia and no polyuria. There are no hypoglycemic complications. Symptoms are improving. Risk factors for coronary artery disease include dyslipidemia, diabetes mellitus, hypertension, obesity, sedentary lifestyle and tobacco exposure. Current diabetic treatment includes insulin injections. Her weight is fluctuating minimally. She is following a generally unhealthy diet. When asked about meal planning, she reported none. She has not had a previous visit with a dietitian. She never participates in exercise. Her home blood glucose trend is decreasing steadily. Her breakfast blood glucose range is generally 140-180 mg/dl. Her lunch blood glucose range is generally 140-180 mg/dl. Her dinner blood glucose range is generally 140-180 mg/dl. Her bedtime blood glucose range is generally 140-180 mg/dl. Her overall blood glucose range is 140-180 mg/dl. (Georgi presents  with continued improvement in her glycemic profile.  She presents with her CGM.  AGP report shows 26% in target, 41% level 1 hyperglycemia, 33% level 2 hyperglycemia.  0% hypoglycemia.  Her point-of-care A1c is 7.4%, progre

## 2022-03-09 ENCOUNTER — Other Ambulatory Visit: Payer: Self-pay | Admitting: "Endocrinology

## 2022-03-09 ENCOUNTER — Telehealth: Payer: Self-pay | Admitting: "Endocrinology

## 2022-03-09 DIAGNOSIS — Z794 Long term (current) use of insulin: Secondary | ICD-10-CM

## 2022-03-09 NOTE — Telephone Encounter (Signed)
She thought you said to hold off on it until she seen Kidney Dr. I advised her that you would like her to take it as prescribed. ?

## 2022-03-09 NOTE — Telephone Encounter (Signed)
Pt states ever since you took her off Metformin her sugar has been over 200 for breakfast, lunch and dinner. Please Advise ?

## 2022-03-16 DIAGNOSIS — I509 Heart failure, unspecified: Secondary | ICD-10-CM | POA: Diagnosis not present

## 2022-03-16 DIAGNOSIS — J449 Chronic obstructive pulmonary disease, unspecified: Secondary | ICD-10-CM | POA: Diagnosis not present

## 2022-03-17 ENCOUNTER — Other Ambulatory Visit: Payer: Self-pay | Admitting: *Deleted

## 2022-03-17 DIAGNOSIS — Z122 Encounter for screening for malignant neoplasm of respiratory organs: Secondary | ICD-10-CM

## 2022-03-17 DIAGNOSIS — Z87891 Personal history of nicotine dependence: Secondary | ICD-10-CM

## 2022-03-27 DIAGNOSIS — E119 Type 2 diabetes mellitus without complications: Secondary | ICD-10-CM | POA: Diagnosis not present

## 2022-03-27 DIAGNOSIS — E1165 Type 2 diabetes mellitus with hyperglycemia: Secondary | ICD-10-CM | POA: Diagnosis not present

## 2022-03-29 DIAGNOSIS — N189 Chronic kidney disease, unspecified: Secondary | ICD-10-CM | POA: Diagnosis not present

## 2022-03-29 DIAGNOSIS — E1122 Type 2 diabetes mellitus with diabetic chronic kidney disease: Secondary | ICD-10-CM | POA: Diagnosis not present

## 2022-03-29 DIAGNOSIS — I5032 Chronic diastolic (congestive) heart failure: Secondary | ICD-10-CM | POA: Diagnosis not present

## 2022-03-29 DIAGNOSIS — D3502 Benign neoplasm of left adrenal gland: Secondary | ICD-10-CM | POA: Diagnosis not present

## 2022-03-29 DIAGNOSIS — R809 Proteinuria, unspecified: Secondary | ICD-10-CM | POA: Diagnosis not present

## 2022-03-29 DIAGNOSIS — E559 Vitamin D deficiency, unspecified: Secondary | ICD-10-CM | POA: Diagnosis not present

## 2022-03-29 DIAGNOSIS — E1129 Type 2 diabetes mellitus with other diabetic kidney complication: Secondary | ICD-10-CM | POA: Diagnosis not present

## 2022-04-06 DIAGNOSIS — E1122 Type 2 diabetes mellitus with diabetic chronic kidney disease: Secondary | ICD-10-CM | POA: Diagnosis not present

## 2022-04-06 DIAGNOSIS — N189 Chronic kidney disease, unspecified: Secondary | ICD-10-CM | POA: Diagnosis not present

## 2022-04-06 DIAGNOSIS — E119 Type 2 diabetes mellitus without complications: Secondary | ICD-10-CM | POA: Diagnosis not present

## 2022-04-06 DIAGNOSIS — E1129 Type 2 diabetes mellitus with other diabetic kidney complication: Secondary | ICD-10-CM | POA: Diagnosis not present

## 2022-04-06 DIAGNOSIS — E559 Vitamin D deficiency, unspecified: Secondary | ICD-10-CM | POA: Diagnosis not present

## 2022-04-06 DIAGNOSIS — R809 Proteinuria, unspecified: Secondary | ICD-10-CM | POA: Diagnosis not present

## 2022-04-06 DIAGNOSIS — I5032 Chronic diastolic (congestive) heart failure: Secondary | ICD-10-CM | POA: Diagnosis not present

## 2022-04-07 LAB — LIPID PANEL
Chol/HDL Ratio: 5.1 ratio — ABNORMAL HIGH (ref 0.0–4.4)
Cholesterol, Total: 208 mg/dL — ABNORMAL HIGH (ref 100–199)
HDL: 41 mg/dL (ref >39)
LDL Chol Calc (NIH): 121 mg/dL — ABNORMAL HIGH (ref 0–99)
Triglycerides: 261 mg/dL — ABNORMAL HIGH (ref 0–149)
VLDL Cholesterol Cal: 46 mg/dL — ABNORMAL HIGH (ref 5–40)

## 2022-04-07 LAB — TSH: TSH: 3.11 u[IU]/mL (ref 0.450–4.500)

## 2022-04-07 LAB — T4, FREE: Free T4: 1.25 ng/dL (ref 0.82–1.77)

## 2022-04-11 ENCOUNTER — Other Ambulatory Visit (HOSPITAL_COMMUNITY): Payer: Self-pay | Admitting: Nephrology

## 2022-04-11 ENCOUNTER — Other Ambulatory Visit: Payer: Self-pay | Admitting: Nephrology

## 2022-04-11 DIAGNOSIS — M5431 Sciatica, right side: Secondary | ICD-10-CM | POA: Diagnosis not present

## 2022-04-11 DIAGNOSIS — E1142 Type 2 diabetes mellitus with diabetic polyneuropathy: Secondary | ICD-10-CM | POA: Diagnosis not present

## 2022-04-11 DIAGNOSIS — R202 Paresthesia of skin: Secondary | ICD-10-CM | POA: Diagnosis not present

## 2022-04-11 DIAGNOSIS — M542 Cervicalgia: Secondary | ICD-10-CM | POA: Diagnosis not present

## 2022-04-11 DIAGNOSIS — G8929 Other chronic pain: Secondary | ICD-10-CM | POA: Diagnosis not present

## 2022-04-11 DIAGNOSIS — M5432 Sciatica, left side: Secondary | ICD-10-CM | POA: Diagnosis not present

## 2022-04-11 DIAGNOSIS — G894 Chronic pain syndrome: Secondary | ICD-10-CM | POA: Diagnosis not present

## 2022-04-11 DIAGNOSIS — M5136 Other intervertebral disc degeneration, lumbar region: Secondary | ICD-10-CM | POA: Diagnosis not present

## 2022-04-11 DIAGNOSIS — M545 Low back pain, unspecified: Secondary | ICD-10-CM | POA: Diagnosis not present

## 2022-04-11 DIAGNOSIS — E278 Other specified disorders of adrenal gland: Secondary | ICD-10-CM

## 2022-04-11 DIAGNOSIS — Z79891 Long term (current) use of opiate analgesic: Secondary | ICD-10-CM | POA: Diagnosis not present

## 2022-04-12 ENCOUNTER — Other Ambulatory Visit: Payer: Self-pay | Admitting: "Endocrinology

## 2022-04-12 ENCOUNTER — Other Ambulatory Visit: Payer: Self-pay | Admitting: Internal Medicine

## 2022-04-12 DIAGNOSIS — E119 Type 2 diabetes mellitus without complications: Secondary | ICD-10-CM

## 2022-04-16 DIAGNOSIS — I509 Heart failure, unspecified: Secondary | ICD-10-CM | POA: Diagnosis not present

## 2022-04-16 DIAGNOSIS — J449 Chronic obstructive pulmonary disease, unspecified: Secondary | ICD-10-CM | POA: Diagnosis not present

## 2022-04-24 ENCOUNTER — Encounter: Payer: Self-pay | Admitting: Acute Care

## 2022-04-24 ENCOUNTER — Ambulatory Visit (INDEPENDENT_AMBULATORY_CARE_PROVIDER_SITE_OTHER): Payer: Medicare Other | Admitting: Acute Care

## 2022-04-24 DIAGNOSIS — Z87891 Personal history of nicotine dependence: Secondary | ICD-10-CM | POA: Diagnosis not present

## 2022-04-24 NOTE — Progress Notes (Signed)
Virtual Visit via Teephone Note  I connected with Christine Weaver on 09/13/21 at  2:00 PM EST by telephone and verified that I am speaking with the correct person using two identifiers.  Location: Patient: Home Provider: Working from  home   I discussed the limitations, risks, security and privacy concerns of performing an evaluation and management service by telephone and the availability of in person appointments. I also discussed with the patient that there may be a patient responsible charge related to this service. The patient expressed understanding and agreed to proceed.  Shared Decision Making Visit Lung Cancer Screening Program [phone])   Eligibility: Age 56 y.o. Pack Years Smoking History Calculation 23 (# packs/per year x # years smoked) Recent History of coughing up blood  no Unexplained weight loss? no ( >Than 15 pounds within the last 6 months ) Prior History Lung / other cancer no (Diagnosis within the last 5 years already requiring surveillance chest CT Scans). Smoking Status Former Smoker Former Smokers: Years since quit: 1 year  Quit Date: 10/29/2020  Visit Components: Discussion included one or more decision making aids. yes Discussion included risk/benefits of screening. yes Discussion included potential follow up diagnostic testing for abnormal scans. yes Discussion included meaning and risk of over diagnosis. yes Discussion included meaning and risk of False Positives. yes Discussion included meaning of total radiation exposure. yes  Counseling Included: Importance of adherence to annual lung cancer LDCT screening. yes Impact of comorbidities on ability to participate in the program. yes Ability and willingness to under diagnostic treatment. yes  Smoking Cessation Counseling: Current Smokers:  Discussed importance of smoking cessation. yes Information about tobacco cessation classes and interventions provided to patient. yes Patient provided with  "ticket" for LDCT Scan. yes Symptomatic Patient. no  Counseling NA Diagnosis Code: Tobacco Use Z72.0 Asymptomatic Patient yes  Counseling NA Former Smokers:  Discussed the importance of maintaining cigarette abstinence. yes Diagnosis Code: Personal History of Nicotine Dependence. A21.308 Information about tobacco cessation classes and interventions provided to patient. Yes Patient provided with "ticket" for LDCT Scan. yes Written Order for Lung Cancer Screening with LDCT placed in Epic. Yes (CT Chest Lung Cancer Screening Low Dose W/O CM) [id number] Z12.2-Screening of respiratory organs Z87.891-Personal history of nicotine dependence   I spent 25 minutes of face to face time with her discussing the risks and benefits of lung cancer screening. We viewed a power point together that explained in detail the above noted topics. We took the time to pause the power point at intervals to allow for questions to be asked and answered to ensure understanding. We discussed that she had taken the single most powerful action possible to decrease her risk of developing lung cancer when she quit smoking. I counseled her to remain smoke free, and to contact me if she ever had the desire to smoke again so that I can provide resources and tools to help support the effort to remain smoke free. We discussed the time and location of the scan, and that either  Abigail Miyamoto RN or I will call with the results within  24-48 hours of receiving them. She has my card and contact information in the event she needs to speak with me, in addition to a copy of the power point we reviewed as a resource. she verbalized understanding of all of the above and had no further questions upon leaving the office.     I explained to the patient that there has been a high incidence of  coronary artery disease noted on these exams. I explained that this is a non-gated exam therefore degree or severity cannot be determined. This patient is on  statin therapy. I have asked the patient to follow-up with their PCP regarding any incidental finding of coronary artery disease and management with diet or medication as they feel is clinically indicated. The patient verbalized understanding of the above and had no further questions.   Timica Marcom D. Tiburcio Pea, NP-C [facility] Personal contact information can be found on Amion  04/24/2022, 10:40 AM

## 2022-04-25 ENCOUNTER — Other Ambulatory Visit (HOSPITAL_COMMUNITY): Payer: Self-pay | Admitting: Nephrology

## 2022-04-25 ENCOUNTER — Ambulatory Visit (HOSPITAL_COMMUNITY)
Admission: RE | Admit: 2022-04-25 | Discharge: 2022-04-25 | Disposition: A | Discharge status: Home / Self Care | Payer: Medicare Other | Source: Ambulatory Visit | Attending: Acute Care | Admitting: Acute Care

## 2022-04-25 ENCOUNTER — Encounter (HOSPITAL_COMMUNITY): Payer: Self-pay

## 2022-04-25 DIAGNOSIS — Z87891 Personal history of nicotine dependence: Secondary | ICD-10-CM | POA: Insufficient documentation

## 2022-04-25 DIAGNOSIS — I2510 Atherosclerotic heart disease of native coronary artery without angina pectoris: Secondary | ICD-10-CM | POA: Diagnosis not present

## 2022-04-25 DIAGNOSIS — E278 Other specified disorders of adrenal gland: Secondary | ICD-10-CM

## 2022-04-25 DIAGNOSIS — J432 Centrilobular emphysema: Secondary | ICD-10-CM | POA: Insufficient documentation

## 2022-04-25 DIAGNOSIS — D3502 Benign neoplasm of left adrenal gland: Secondary | ICD-10-CM | POA: Diagnosis not present

## 2022-04-25 DIAGNOSIS — I10 Essential (primary) hypertension: Secondary | ICD-10-CM | POA: Insufficient documentation

## 2022-04-25 DIAGNOSIS — I700 Atherosclerosis of aorta: Secondary | ICD-10-CM | POA: Diagnosis not present

## 2022-04-25 DIAGNOSIS — Z122 Encounter for screening for malignant neoplasm of respiratory organs: Secondary | ICD-10-CM | POA: Diagnosis not present

## 2022-04-27 ENCOUNTER — Other Ambulatory Visit: Payer: Self-pay | Admitting: Acute Care

## 2022-04-27 DIAGNOSIS — Z122 Encounter for screening for malignant neoplasm of respiratory organs: Secondary | ICD-10-CM

## 2022-04-27 DIAGNOSIS — E1165 Type 2 diabetes mellitus with hyperglycemia: Secondary | ICD-10-CM | POA: Diagnosis not present

## 2022-04-27 DIAGNOSIS — Z87891 Personal history of nicotine dependence: Secondary | ICD-10-CM

## 2022-04-27 DIAGNOSIS — E119 Type 2 diabetes mellitus without complications: Secondary | ICD-10-CM | POA: Diagnosis not present

## 2022-05-03 DIAGNOSIS — E611 Iron deficiency: Secondary | ICD-10-CM | POA: Diagnosis not present

## 2022-05-03 DIAGNOSIS — R778 Other specified abnormalities of plasma proteins: Secondary | ICD-10-CM | POA: Diagnosis not present

## 2022-05-03 DIAGNOSIS — R718 Other abnormality of red blood cells: Secondary | ICD-10-CM | POA: Diagnosis not present

## 2022-05-03 DIAGNOSIS — I5032 Chronic diastolic (congestive) heart failure: Secondary | ICD-10-CM | POA: Diagnosis not present

## 2022-05-03 DIAGNOSIS — D3502 Benign neoplasm of left adrenal gland: Secondary | ICD-10-CM | POA: Diagnosis not present

## 2022-05-03 DIAGNOSIS — E559 Vitamin D deficiency, unspecified: Secondary | ICD-10-CM | POA: Diagnosis not present

## 2022-05-03 DIAGNOSIS — E1122 Type 2 diabetes mellitus with diabetic chronic kidney disease: Secondary | ICD-10-CM | POA: Diagnosis not present

## 2022-05-03 DIAGNOSIS — R808 Other proteinuria: Secondary | ICD-10-CM | POA: Diagnosis not present

## 2022-05-03 DIAGNOSIS — N189 Chronic kidney disease, unspecified: Secondary | ICD-10-CM | POA: Diagnosis not present

## 2022-05-05 ENCOUNTER — Encounter (INDEPENDENT_AMBULATORY_CARE_PROVIDER_SITE_OTHER): Payer: Self-pay | Admitting: *Deleted

## 2022-05-09 DIAGNOSIS — R202 Paresthesia of skin: Secondary | ICD-10-CM | POA: Diagnosis not present

## 2022-05-09 DIAGNOSIS — M545 Low back pain, unspecified: Secondary | ICD-10-CM | POA: Diagnosis not present

## 2022-05-09 DIAGNOSIS — Z79891 Long term (current) use of opiate analgesic: Secondary | ICD-10-CM | POA: Diagnosis not present

## 2022-05-09 DIAGNOSIS — G894 Chronic pain syndrome: Secondary | ICD-10-CM | POA: Diagnosis not present

## 2022-05-09 DIAGNOSIS — M5432 Sciatica, left side: Secondary | ICD-10-CM | POA: Diagnosis not present

## 2022-05-09 DIAGNOSIS — M5431 Sciatica, right side: Secondary | ICD-10-CM | POA: Diagnosis not present

## 2022-05-09 DIAGNOSIS — M542 Cervicalgia: Secondary | ICD-10-CM | POA: Diagnosis not present

## 2022-05-09 DIAGNOSIS — G8929 Other chronic pain: Secondary | ICD-10-CM | POA: Diagnosis not present

## 2022-05-10 ENCOUNTER — Other Ambulatory Visit (HOSPITAL_COMMUNITY): Payer: Self-pay | Admitting: Nephrology

## 2022-05-10 ENCOUNTER — Other Ambulatory Visit: Payer: Self-pay | Admitting: "Endocrinology

## 2022-05-10 ENCOUNTER — Other Ambulatory Visit: Payer: Self-pay | Admitting: Nephrology

## 2022-05-10 DIAGNOSIS — E1122 Type 2 diabetes mellitus with diabetic chronic kidney disease: Secondary | ICD-10-CM

## 2022-05-10 DIAGNOSIS — R808 Other proteinuria: Secondary | ICD-10-CM

## 2022-05-10 DIAGNOSIS — E559 Vitamin D deficiency, unspecified: Secondary | ICD-10-CM

## 2022-05-10 MED ORDER — LANTUS SOLOSTAR 100 UNIT/ML ~~LOC~~ SOPN: 80.0000 [IU] | PEN_INJECTOR | Freq: Every day | SUBCUTANEOUS | 2 refills | Status: DC

## 2022-05-11 ENCOUNTER — Other Ambulatory Visit: Payer: Self-pay | Admitting: Internal Medicine

## 2022-05-11 ENCOUNTER — Other Ambulatory Visit: Payer: Self-pay | Admitting: "Endocrinology

## 2022-05-11 ENCOUNTER — Telehealth: Payer: Self-pay

## 2022-05-11 DIAGNOSIS — E119 Type 2 diabetes mellitus without complications: Secondary | ICD-10-CM

## 2022-05-11 DIAGNOSIS — E7849 Other hyperlipidemia: Secondary | ICD-10-CM

## 2022-05-11 NOTE — Telephone Encounter (Signed)
7/13 lvm for pt to call back to RS her Kidney BX

## 2022-05-16 DIAGNOSIS — I509 Heart failure, unspecified: Secondary | ICD-10-CM | POA: Diagnosis not present

## 2022-05-16 DIAGNOSIS — J449 Chronic obstructive pulmonary disease, unspecified: Secondary | ICD-10-CM | POA: Diagnosis not present

## 2022-05-29 DIAGNOSIS — E1165 Type 2 diabetes mellitus with hyperglycemia: Secondary | ICD-10-CM | POA: Diagnosis not present

## 2022-05-29 DIAGNOSIS — E119 Type 2 diabetes mellitus without complications: Secondary | ICD-10-CM | POA: Diagnosis not present

## 2022-05-30 ENCOUNTER — Ambulatory Visit (HOSPITAL_COMMUNITY): Payer: Medicare Other

## 2022-05-31 ENCOUNTER — Other Ambulatory Visit: Payer: Self-pay | Admitting: Radiology

## 2022-05-31 DIAGNOSIS — N189 Chronic kidney disease, unspecified: Secondary | ICD-10-CM

## 2022-06-01 ENCOUNTER — Other Ambulatory Visit: Payer: Self-pay | Admitting: Student

## 2022-06-01 ENCOUNTER — Other Ambulatory Visit: Payer: Self-pay | Admitting: Internal Medicine

## 2022-06-02 ENCOUNTER — Encounter (HOSPITAL_COMMUNITY): Payer: Self-pay

## 2022-06-02 ENCOUNTER — Ambulatory Visit (HOSPITAL_COMMUNITY)
Admission: RE | Admit: 2022-06-02 | Discharge: 2022-06-02 | Disposition: A | Discharge status: Home / Self Care | Payer: Medicare Other | Source: Ambulatory Visit | Attending: Nephrology | Admitting: Nephrology

## 2022-06-02 ENCOUNTER — Other Ambulatory Visit: Payer: Self-pay

## 2022-06-02 DIAGNOSIS — N189 Chronic kidney disease, unspecified: Secondary | ICD-10-CM

## 2022-06-02 DIAGNOSIS — R808 Other proteinuria: Secondary | ICD-10-CM | POA: Insufficient documentation

## 2022-06-02 DIAGNOSIS — E559 Vitamin D deficiency, unspecified: Secondary | ICD-10-CM

## 2022-06-02 DIAGNOSIS — E1122 Type 2 diabetes mellitus with diabetic chronic kidney disease: Secondary | ICD-10-CM | POA: Diagnosis not present

## 2022-06-02 DIAGNOSIS — N1831 Chronic kidney disease, stage 3a: Secondary | ICD-10-CM | POA: Insufficient documentation

## 2022-06-02 DIAGNOSIS — I129 Hypertensive chronic kidney disease with stage 1 through stage 4 chronic kidney disease, or unspecified chronic kidney disease: Secondary | ICD-10-CM | POA: Diagnosis not present

## 2022-06-02 DIAGNOSIS — R809 Proteinuria, unspecified: Secondary | ICD-10-CM | POA: Insufficient documentation

## 2022-06-02 HISTORY — PX: RENAL BIOPSY: SHX156

## 2022-06-02 HISTORY — DX: Lyme disease, unspecified: A69.20

## 2022-06-02 HISTORY — DX: Bell's palsy: G51.0

## 2022-06-02 LAB — CBC
HCT: 39.2 % (ref 36.0–46.0)
Hemoglobin: 12.2 g/dL (ref 12.0–15.0)
MCH: 23.9 pg — ABNORMAL LOW (ref 26.0–34.0)
MCHC: 31.1 g/dL (ref 30.0–36.0)
MCV: 76.7 fL — ABNORMAL LOW (ref 80.0–100.0)
Platelets: 241 10*3/uL (ref 150–400)
RBC: 5.11 MIL/uL (ref 3.87–5.11)
RDW: 15 % (ref 11.5–15.5)
WBC: 9.5 10*3/uL (ref 4.0–10.5)
nRBC: 0 % (ref 0.0–0.2)

## 2022-06-02 LAB — GLUCOSE, CAPILLARY: Glucose-Capillary: 177 mg/dL — ABNORMAL HIGH (ref 70–99)

## 2022-06-02 LAB — PROTIME-INR
INR: 0.9 (ref 0.8–1.2)
Prothrombin Time: 12.4 seconds (ref 11.4–15.2)

## 2022-06-02 MED ORDER — LIDOCAINE HCL (PF) 1 % IJ SOLN
INTRAMUSCULAR | Status: AC
  Filled 2022-06-02: qty 30

## 2022-06-02 MED ORDER — FENTANYL CITRATE (PF) 100 MCG/2ML IJ SOLN
INTRAMUSCULAR | Status: AC
  Filled 2022-06-02: qty 2

## 2022-06-02 MED ORDER — GELATIN ABSORBABLE 12-7 MM EX MISC
CUTANEOUS | Status: AC
  Filled 2022-06-02: qty 1

## 2022-06-02 MED ORDER — MIDAZOLAM HCL 2 MG/2ML IJ SOLN
INTRAMUSCULAR | Status: AC
  Filled 2022-06-02: qty 2

## 2022-06-02 MED ORDER — MIDAZOLAM HCL 2 MG/2ML IJ SOLN
INTRAMUSCULAR | Status: AC | PRN
  Administered 2022-06-02 (×2): 1 mg via INTRAVENOUS

## 2022-06-02 MED ORDER — ACETAMINOPHEN 500 MG PO TABS
1000.0000 mg | ORAL_TABLET | Freq: Once | ORAL | Status: AC
  Administered 2022-06-02: 1000 mg via ORAL
  Filled 2022-06-02: qty 2

## 2022-06-02 MED ORDER — FENTANYL CITRATE (PF) 100 MCG/2ML IJ SOLN
INTRAMUSCULAR | Status: AC | PRN
  Administered 2022-06-02 (×2): 50 ug via INTRAVENOUS

## 2022-06-02 MED ORDER — SODIUM CHLORIDE 0.9 % IV SOLN: INTRAVENOUS | Status: DC

## 2022-06-02 NOTE — H&P (Signed)
Chief Complaint: CKD stage 3a  Referring Physician(s): Theador Hawthorne   Supervising Physician: Juliet Rude  Patient Status: [facility] - Out-pt  History of Present Illness: Christine Weaver is a 56 y.o. female with medical issues including type 2 diabetes, COPD, hypertension and CKD stage 3a.  She was sent to Nephrology for evaluation from her PCP due to proteinuria.  She is here today for random renal biopsy.  She is NPO. No nausea/vomiting. No Fever/chills. ROS negative.   Past Medical History:  Diagnosis Date   Asthma    Bell's palsy    COPD (chronic obstructive pulmonary disease) ([place])    Hyperlipidemia    Hypertension    Lyme disease    Type 2 diabetes mellitus ([place])     Past Surgical History:  Procedure Laterality Date   CESAREAN SECTION     COLONOSCOPY WITH PROPOFOL N/A 12/16/2021   Procedure: COLONOSCOPY WITH PROPOFOL;  Surgeon: Harvel Quale, MD;  [facility];  Service: Gastroenterology;  Laterality: N/A;  [place]  12/16/2021   Procedure: HOT HEMOSTASIS (ARGON PLASMA COAGULATION/BICAP);  Surgeon: Montez Morita, Quillian Quince, MD;  [facility];  Service: Gastroenterology;;   POLYPECTOMY  12/16/2021   Procedure: POLYPECTOMY;  Surgeon: Harvel Quale, MD;  [facility];  Service: Gastroenterology;;   SUBMUCOSAL TATTOO INJECTION  12/16/2021   Procedure: SUBMUCOSAL TATTOO INJECTION;  Surgeon: Harvel Quale, MD;  [facility];  Service: Gastroenterology;;    Allergies: Morphine and related, Other, Oxycodone-acetaminophen, and Latex  Medications: Prior to Admission medications   Medication Sig Start Date End Date Taking? Authorizing Provider  albuterol (VENTOLIN HFA) 108 (90 Base) MCG/ACT inhaler Inhale 2 puffs into the lungs every 4 (four) hours as needed for wheezing or shortness of breath. 11/10/20  Yes Lindell Spar, MD  amLODipine (NORVASC) 10 MG tablet TAKE 1 TABLET BY MOUTH  EVERY DAY 01/20/22  Yes Lindell Spar, MD  atorvastatin (LIPITOR) 80 MG tablet TAKE 1 TABLET BY MOUTH EVERY DAY 05/11/22  Yes Lindell Spar, MD  budesonide-formoterol [facility]) 80-4.5 MCG/ACT inhaler Take 2 puffs first thing in am and then another 2 puffs about 12 hours later. 06/30/21  Yes Tanda Rockers, MD  diclofenac Sodium (VOLTAREN) 1 % GEL Apply 1 Application topically 4 (four) times daily as needed (pain).   Yes [provider]  famotidine (PEPCID) 20 MG tablet TAKE 1 TABLET AFTER SUPPER 04/12/22  Yes Tanda Rockers, MD  Ferrous Sulfate (IRON PO) Take 1 tablet by mouth daily.   Yes [provider]  fluticasone (FLONASE) 50 MCG/ACT nasal spray Place 1 spray into both nostrils daily as needed for allergies.   Yes [provider]  furosemide (LASIX) 20 MG tablet TAKE 1 TABLET BY MOUTH TWICE A DAY 10/17/21  Yes Satira Sark, MD  HUMALOG KWIKPEN 100 UNIT/ML KwikPen INJECT 10 UNITS INTO THE SKIN 3 TIMES A DAY BEFORE MEALS Patient taking differently: Inject 10-20 Units into the skin 3 (three) times daily before meals. 05/11/22  Yes Nida, Marella Chimes, MD  HYDROcodone-acetaminophen (NORCO/VICODIN) 5-325 MG tablet Take 1 tablet by mouth every 12 (twelve) hours as needed for severe pain.   Yes [provider]  insulin glargine (LANTUS SOLOSTAR) 100 UNIT/ML Solostar Pen Inject 80 Units into the skin at bedtime. 05/10/22  Yes Nida, Marella Chimes, MD  irbesartan (AVAPRO) 75 MG tablet Take 1 tablet (75 mg total) by mouth daily. 08/30/21  Yes  Tanda Rockers, MD  metFORMIN (GLUCOPHAGE) 1000 MG tablet TAKE 1 TABLET (1,000 MG TOTAL) BY MOUTH TWICE A DAY WITH FOOD 03/09/22  Yes Nida, Marella Chimes, MD  nystatin cream (MYCOSTATIN) Apply 1 application  topically 2 (two) times daily as needed (rash).   Yes [provider]  omeprazole (PRILOSEC) 40 MG capsule TAKE 1 CAPSULE (40 MG TOTAL) BY MOUTH DAILY. 01/02/22  Yes Fayrene Helper, MD  VICTOZA 18 MG/3ML  SOPN INJECT 1.8 MG UNDER THE SKIN ONCE DAILY 05/11/22  Yes Cassandria Anger, MD  VITAMIN D PO Take 1 capsule by mouth daily.   Yes [provider]  Celedonio Miyamoto 62.5-25 MCG/ACT AEPB TAKE 1 PUFF BY MOUTH EVERY DAY Patient not taking: Reported on 05/30/2022 12/15/21   Lindell Spar, MD  BD PEN NEEDLE NANO 2ND GEN 32G X 4 MM MISC USE 1 PEN NEEDLE FOR 6 INJECTIONS DAILY FOR DIABETES E11.9 04/25/21   Lindell Spar, MD  cephALEXin (KEFLEX) 500 MG capsule Take 1 capsule (500 mg total) by mouth 3 (three) times daily. Patient not taking: Reported on 05/30/2022 02/06/22   Lindell Spar, MD  Continuous Blood Gluc Receiver (FREESTYLE LIBRE 2 READER) DEVI As directed 12/23/20   Cassandria Anger, MD  glucose blood (ACCU-CHEK AVIVA PLUS) test strip  10/27/12   [provider]  metFORMIN (GLUCOPHAGE) 500 MG tablet Take 1 tablet (500 mg total) by mouth 2 (two) times daily with a meal. Skip until you see the Kidney doctor Patient not taking: Reported on 05/30/2022 03/07/22   Cassandria Anger, MD     Family History  Problem Relation Age of Onset   Thyroid disease Mother    Hypertension Mother    Heart attack Mother    Stroke Mother    Heart failure Mother    Diabetes Mother    Emphysema Mother    Cancer Mother    Diabetes Father    Heart attack Father    Stroke Father    Kidney disease Father    Heart failure Father     Social History   Socioeconomic History   Marital status: Widowed    Spouse name: Not on file   Number of children: Not on file   Years of education: Not on file   Highest education level: Not on file  Occupational History   Not on file  Tobacco Use   Smoking status: Former    Types: Cigarettes    Quit date: 10/29/2020    Years since quitting: 1.5   Smokeless tobacco: Never   Tobacco comments:    Uses nicotine patches. Has not smoked since new years eve 2021-  06/30/21  Vaping Use   Vaping Use: Never used  Substance and Sexual Activity   Alcohol  use: Never   Drug use: Never   Sexual activity: Not on file  Other Topics Concern   Not on file  Social History Narrative   Not on file   Social Determinants of Health   Financial Resource Strain: Low Risk  (07/15/2021)   Overall Financial Resource Strain (CARDIA)    Difficulty of Paying Living Expenses: Not hard at all  Food Insecurity: No Food Insecurity (07/15/2021)   Hunger Vital Sign    Worried About Running Out of Food in the Last Year: Never true    Ran Out of Food in the Last Year: Never true  Transportation Needs: No Transportation Needs (07/15/2021)   PRAPARE - Transportation  Lack of Transportation (Medical): No    Lack of Transportation (Non-Medical): No  Physical Activity: Inactive (07/15/2021)   Exercise Vital Sign    Days of Exercise per Week: 0 days    Minutes of Exercise per Session: 0 min  Stress: No Stress Concern Present (07/15/2021)   [place]    Feeling of Stress : Not at all  Social Connections: Socially Isolated (07/15/2021)   Social Connection and Isolation Panel [NHANES]    Frequency of Communication with Friends and Family: More than three times a week    Frequency of Social Gatherings with Friends and Family: Never    Attends Religious Services: Never    Marine scientist or Organizations: No    Attends Archivist Meetings: Never    Marital Status: Widowed     Review of Systems: A 12 point ROS discussed and pertinent positives are indicated in the HPI above.  All other systems are negative.  Review of Systems  Vital Signs: BP (!) 153/90   Pulse 99   Temp 97.9 F (36.6 C) (Temporal)   Resp 18   Ht 5' (1.524 m)   Wt 208 lb (94.3 kg)   SpO2 95%   BMI 40.62 kg/m   Physical Exam Vitals reviewed.  Constitutional:      Appearance: Normal appearance.  HENT:     Head: Normocephalic and atraumatic.  Eyes:     Extraocular Movements: Extraocular movements  intact.  Cardiovascular:     Rate and Rhythm: Normal rate and regular rhythm.  Pulmonary:     Effort: Pulmonary effort is normal. No respiratory distress.     Breath sounds: Normal breath sounds.  Abdominal:     Palpations: Abdomen is soft.  Musculoskeletal:        General: Normal range of motion.     Cervical back: Normal range of motion.  Skin:    General: Skin is warm and dry.  Neurological:     General: No focal deficit present.     Mental Status: She is alert and oriented to person, place, and time.  Psychiatric:        Mood and Affect: Mood normal.        Behavior: Behavior normal.        Thought Content: Thought content normal.        Judgment: Judgment normal.     Imaging: No results found.  Labs:  CBC: Recent Labs    06/30/21 1532 10/13/21 1008  WBC 11.8* 8.8  HGB 13.2 12.7  HCT 41.2 39.4  PLT 266 239    COAGS: No results for input(s): "INR", "APTT" in the last 8760 hours.  BMP: Recent Labs    06/30/21 1532 10/13/21 1008 12/14/21 0901  NA 142 139 138  K 4.4 4.1 3.9  CL 104 100 100  CO2 '27 25 28  '$ GLUCOSE 165* 129* 184*  BUN '13 12 13  '$ CALCIUM 10.2 9.5 9.3  CREATININE 1.01* 1.04* 0.87  GFRNONAA  --   --  >60    LIVER FUNCTION TESTS: Recent Labs    10/13/21 1008  BILITOT <0.2  AST 14  ALT 13  ALKPHOS 86  PROT 6.3  ALBUMIN 4.0    TUMOR MARKERS: No results for input(s): "AFPTM", "CEA", "CA199", "CHROMGRNA" in the last 8760 hours.  Assessment and Plan:  CKD stage 3 a, proteinuria.  Will proceed with image guided random renal biopsy today by Dr. Denna Haggard.  Risks and benefits of random renal biopsy was discussed with the patient and/or patient's family including, but not limited to bleeding, infection, damage to adjacent structures or low yield requiring additional tests.  All of the questions were answered and there is agreement to proceed.  Consent signed and in chart.  Thank you for allowing our service to participate in Emilio Aspen 's care.  Electronically Signed: Murrell Redden, PA-C   06/02/2022, 7:15 AM      I spent a total of  30 Minutes  in face to face in clinical consultation, greater than 50% of which was counseling/coordinating care for random renal biopsy.

## 2022-06-02 NOTE — Procedures (Signed)
Interventional Radiology Procedure Note  Date of Procedure: 06/02/2022  Procedure: [place] random renal biopsy   Findings:  1. [place] random renal biopsy, left kidney, 18ga cores x2 passes  2. Gelfoam slurry    Complications: No immediate complications noted.   Estimated Blood Loss: minimal  Follow-up and Recommendations: 1. Bedrest 4 hours    Albin Felling, MD  Vascular & Interventional Radiology  06/02/2022 9:02 AM

## 2022-06-02 NOTE — Progress Notes (Signed)
Patient complaining of mild soreness at biopsy site. Biopsy site assessed and WDL. Patient requesting Tylenol. Hildred Alamin, [place] gave verbal orders for Tylenol 1,000 mg PO once. Orders placed and followed. Will continue to monitor.

## 2022-06-02 NOTE — Sedation Documentation (Signed)
Biopsy site is clean, dry and intact. No drainage noted from bandage.

## 2022-06-06 DIAGNOSIS — E1142 Type 2 diabetes mellitus with diabetic polyneuropathy: Secondary | ICD-10-CM | POA: Diagnosis not present

## 2022-06-06 DIAGNOSIS — G894 Chronic pain syndrome: Secondary | ICD-10-CM | POA: Diagnosis not present

## 2022-06-06 DIAGNOSIS — Z79891 Long term (current) use of opiate analgesic: Secondary | ICD-10-CM | POA: Diagnosis not present

## 2022-06-06 DIAGNOSIS — M5432 Sciatica, left side: Secondary | ICD-10-CM | POA: Diagnosis not present

## 2022-06-06 DIAGNOSIS — M5431 Sciatica, right side: Secondary | ICD-10-CM | POA: Diagnosis not present

## 2022-06-06 DIAGNOSIS — R202 Paresthesia of skin: Secondary | ICD-10-CM | POA: Diagnosis not present

## 2022-06-06 DIAGNOSIS — M545 Low back pain, unspecified: Secondary | ICD-10-CM | POA: Diagnosis not present

## 2022-06-06 DIAGNOSIS — M542 Cervicalgia: Secondary | ICD-10-CM | POA: Diagnosis not present

## 2022-06-06 DIAGNOSIS — G8929 Other chronic pain: Secondary | ICD-10-CM | POA: Diagnosis not present

## 2022-06-06 DIAGNOSIS — M5136 Other intervertebral disc degeneration, lumbar region: Secondary | ICD-10-CM | POA: Diagnosis not present

## 2022-06-08 ENCOUNTER — Ambulatory Visit (INDEPENDENT_AMBULATORY_CARE_PROVIDER_SITE_OTHER): Payer: Medicare Other | Admitting: Internal Medicine

## 2022-06-08 ENCOUNTER — Other Ambulatory Visit: Payer: Self-pay | Admitting: "Endocrinology

## 2022-06-08 ENCOUNTER — Encounter: Payer: Self-pay | Admitting: Internal Medicine

## 2022-06-08 VITALS — BP 128/52 | HR 97 | Resp 18 | Ht 60.0 in | Wt 207.8 lb

## 2022-06-08 DIAGNOSIS — N189 Chronic kidney disease, unspecified: Secondary | ICD-10-CM | POA: Diagnosis not present

## 2022-06-08 DIAGNOSIS — E782 Mixed hyperlipidemia: Secondary | ICD-10-CM

## 2022-06-08 DIAGNOSIS — I10 Essential (primary) hypertension: Secondary | ICD-10-CM

## 2022-06-08 DIAGNOSIS — N1831 Chronic kidney disease, stage 3a: Secondary | ICD-10-CM | POA: Diagnosis not present

## 2022-06-08 DIAGNOSIS — I5032 Chronic diastolic (congestive) heart failure: Secondary | ICD-10-CM

## 2022-06-08 DIAGNOSIS — J449 Chronic obstructive pulmonary disease, unspecified: Secondary | ICD-10-CM | POA: Diagnosis not present

## 2022-06-08 DIAGNOSIS — Z794 Long term (current) use of insulin: Secondary | ICD-10-CM

## 2022-06-08 DIAGNOSIS — E1122 Type 2 diabetes mellitus with diabetic chronic kidney disease: Secondary | ICD-10-CM | POA: Diagnosis not present

## 2022-06-08 DIAGNOSIS — J9611 Chronic respiratory failure with hypoxia: Secondary | ICD-10-CM

## 2022-06-08 DIAGNOSIS — R808 Other proteinuria: Secondary | ICD-10-CM | POA: Diagnosis not present

## 2022-06-08 DIAGNOSIS — E559 Vitamin D deficiency, unspecified: Secondary | ICD-10-CM | POA: Diagnosis not present

## 2022-06-08 NOTE — Assessment & Plan Note (Signed)
BP Readings from Last 1 Encounters:  06/08/22 (!) 128/52   Well-controlled with Amlodipine and Irbesartan now Counseled for compliance with the medications Advised DASH diet and moderate exercise/walking, at least 150 mins/week

## 2022-06-08 NOTE — Assessment & Plan Note (Signed)
On Symbicort Uses Albuterol nebs and inhaler PRN On home O2 - 2 lpm PRN

## 2022-06-08 NOTE — Assessment & Plan Note (Signed)
On Lipitor Check lipid profile 

## 2022-06-08 NOTE — Assessment & Plan Note (Signed)
Uses 2 lpm O2 PRN F/u Pulmonology Quit smoking in 2022, smoked about 0.5 pack/day for about 35 years 

## 2022-06-08 NOTE — Assessment & Plan Note (Signed)
Followed by Dr Theador Hawthorne Last BMP reviewed Recently had renal biopsy, going to see Dr Theador Hawthorne today

## 2022-06-08 NOTE — Assessment & Plan Note (Addendum)
Lab Results  Component Value Date   HGBA1C 7.4 (A) 03/07/2022   Better controlled Takes Lantus 80 U qHS, Humalog 14-20 U TID according to sliding scale Victoza 1.8 mg QD Metformin 1000 mg BID Follows up with Dr Dorris Fetch - advised to discuss SGLT2i as she has HFpEF and CKD On statin and ARB Referred to Ophthalmology for diabetic eye exam
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
## 2022-06-08 NOTE — Assessment & Plan Note (Signed)
Appears euvolemic Continue Lasix Follows up with Cardiologist Can be a good candidate for SGLT2i, advised to discuss with Dr Dorris Fetch

## 2022-06-08 NOTE — Patient Instructions (Signed)
Please continue taking medications as prescribed.  Please continue to follow low carb diet and perform moderate exercise/walking at least 150 mins/week.  Please discuss with Dr Dorris Fetch about starting [place] or [place].

## 2022-06-14 ENCOUNTER — Encounter: Payer: Self-pay | Admitting: *Deleted

## 2022-06-16 DIAGNOSIS — I509 Heart failure, unspecified: Secondary | ICD-10-CM | POA: Diagnosis not present

## 2022-06-16 DIAGNOSIS — J449 Chronic obstructive pulmonary disease, unspecified: Secondary | ICD-10-CM | POA: Diagnosis not present

## 2022-06-19 ENCOUNTER — Encounter (HOSPITAL_COMMUNITY): Payer: Self-pay

## 2022-06-19 LAB — SURGICAL PATHOLOGY

## 2022-06-27 ENCOUNTER — Other Ambulatory Visit: Payer: Self-pay | Admitting: "Endocrinology

## 2022-06-27 ENCOUNTER — Other Ambulatory Visit: Payer: Self-pay | Admitting: Family Medicine

## 2022-06-27 DIAGNOSIS — E119 Type 2 diabetes mellitus without complications: Secondary | ICD-10-CM

## 2022-06-27 DIAGNOSIS — K219 Gastro-esophageal reflux disease without esophagitis: Secondary | ICD-10-CM

## 2022-06-29 DIAGNOSIS — E1165 Type 2 diabetes mellitus with hyperglycemia: Secondary | ICD-10-CM | POA: Diagnosis not present

## 2022-06-29 DIAGNOSIS — E119 Type 2 diabetes mellitus without complications: Secondary | ICD-10-CM | POA: Diagnosis not present

## 2022-07-03 ENCOUNTER — Other Ambulatory Visit: Payer: Self-pay | Admitting: Internal Medicine

## 2022-07-06 ENCOUNTER — Ambulatory Visit (INDEPENDENT_AMBULATORY_CARE_PROVIDER_SITE_OTHER): Payer: Medicare Other | Admitting: Gastroenterology

## 2022-07-06 ENCOUNTER — Encounter (INDEPENDENT_AMBULATORY_CARE_PROVIDER_SITE_OTHER): Payer: Self-pay | Admitting: Gastroenterology

## 2022-07-06 VITALS — BP 127/81 | HR 89 | Temp 98.0°F | Ht 60.0 in | Wt 203.5 lb

## 2022-07-06 DIAGNOSIS — D509 Iron deficiency anemia, unspecified: Secondary | ICD-10-CM | POA: Insufficient documentation

## 2022-07-06 NOTE — Progress Notes (Signed)
Referring Provider: Lindell Spar, MD Primary Care Physician:  Lindell Spar, MD Primary GI Physician: Jenetta Downer   Chief Complaint  Patient presents with   Anemia    Referred for iron def anemia.    HPI:   Christine Weaver is a 56 y.o. female with past medical history of asthma, COPD, hyperlipidemia, hypertension and diabetes  Patient presenting today for IDA  Last seen December 2022 for positive cologuard, at that time she had no GI complaints, scheduled for colonoscopy, as outlined below.  Today presents with IDA, labs done in June with hgb 7.7, iron 37, iron sat 10%, TIBC 385, ferritin 20, b 12 297. Last hgb on 8/4 12.2 patient reports she was not on iron at that time.   States she has no worsening fatigue, dizziness, sob, states she has COPD and Diabetes so always has SOB and fatigue. She has no abdominal pain. Is trying to lose weight due to her DM. She has lost approximately 47 pounds over the past year. She has no rectal bleeding. She denies  postprandial abdominal pain or early satiety. Denies NSAIDs. She tells me she has had IDA in the past, no endoscopic evaluation, was just started on iron and it improved.   She notes recent kidney biopsy on 06/02/22.   Last Colonoscopy:12/16/21 - Six 3 to 6 mm polyps in the descending colon, in the transverse colon, in the ascending colon and in the cecum, removed with a cold snare.  - Two 8 to 10 mm polyps in the descending colon, - One 4 mm polyp in the sigmoid colon, removed with a hot snare.  - Non-bleeding internal hemorrhoids. Last Endoscopy:never  Recommendations:  Repeat colonoscopy in 3 years  Past Medical History:  Diagnosis Date   Asthma    Bell's palsy    COPD (chronic obstructive pulmonary disease) ([place])    Hyperlipidemia    Hypertension    Lyme disease    Type 2 diabetes mellitus ([place])     Past Surgical History:  Procedure Laterality Date   CESAREAN SECTION     COLONOSCOPY WITH PROPOFOL N/A 12/16/2021    Procedure: COLONOSCOPY WITH PROPOFOL;  Surgeon: Harvel Quale, MD;  [facility]: [facility];  Service: Gastroenterology;  Laterality: N/A;  [place]  12/16/2021   Procedure: HOT HEMOSTASIS (ARGON PLASMA COAGULATION/BICAP);  Surgeon: Montez Morita, Quillian Quince, MD;  Location: [facility];  Service: Gastroenterology;;   POLYPECTOMY  12/16/2021   Procedure: POLYPECTOMY;  Surgeon: Montez Morita, Quillian Quince, MD;  Location: [facility];  Service: Gastroenterology;;   SUBMUCOSAL TATTOO INJECTION  12/16/2021   Procedure: SUBMUCOSAL TATTOO INJECTION;  Surgeon: Montez Morita, Quillian Quince, MD;  Location: [facility];  Service: Gastroenterology;;    Current Outpatient Medications  Medication Sig Dispense Refill   albuterol (VENTOLIN HFA) 108 (90 Base) MCG/ACT inhaler Inhale 2 puffs into the lungs every 4 (four) hours as needed for wheezing or shortness of breath. 18 g 5   amLODipine (NORVASC) 10 MG tablet TAKE 1 TABLET BY MOUTH EVERY DAY 90 tablet 1   ANORO ELLIPTA 62.5-25 MCG/ACT AEPB TAKE 1 PUFF BY MOUTH EVERY DAY 60 each 5   atorvastatin (LIPITOR) 80 MG tablet TAKE 1 TABLET BY MOUTH EVERY DAY 90 tablet 1   BD PEN NEEDLE NANO 2ND GEN 32G X 4 MM MISC USE 1 PEN NEEDLE FOR 6 INJECTIONS DAILY FOR DIABETES E11.9 200 each 11   budesonide-formoterol (SYMBICORT) 80-4.5 MCG/ACT inhaler TAKE 2 PUFFS FIRST THING IN  AM AND THEN ANOTHER 2 PUFFS ABOUT 12 HOURS LATER 10.2 each 11   Continuous Blood Gluc Receiver (FREESTYLE LIBRE 2 READER) DEVI As directed 1 each 0   diclofenac Sodium (VOLTAREN) 1 % GEL Apply 1 Application topically 4 (four) times daily as needed (pain).     famotidine (PEPCID) 20 MG tablet TAKE 1 TABLET AFTER SUPPER 90 tablet 3   Ferrous Sulfate (IRON PO) Take 1 tablet by mouth daily.     fluticasone (FLONASE) 50 MCG/ACT nasal spray Place 1 spray into both nostrils daily as needed for allergies.     furosemide (LASIX) 20 MG tablet TAKE 1 TABLET BY MOUTH TWICE A DAY 180 tablet 3    glucose blood (ACCU-CHEK AVIVA PLUS) test strip      HUMALOG KWIKPEN 100 UNIT/ML KwikPen INJECT 10 UNITS INTO THE SKIN 3 TIMES A DAY BEFORE MEALS (Patient taking differently: Inject 10-20 Units into the skin 3 (three) times daily before meals.) 15 mL 1   HYDROcodone-acetaminophen (NORCO/VICODIN) 5-325 MG tablet Take 1 tablet by mouth every 12 (twelve) hours as needed for severe pain.     irbesartan (AVAPRO) 75 MG tablet Take 1 tablet (75 mg total) by mouth daily. 30 tablet 11   LANTUS SOLOSTAR 100 UNIT/ML Solostar Pen INJECT 80 UNITS INTO THE SKIN AT BEDTIME. 30 mL 2   metFORMIN (GLUCOPHAGE) 1000 MG tablet TAKE 1 TABLET TWICE A DAY WITH FOOD 180 tablet 0   nystatin cream (MYCOSTATIN) Apply 1 application  topically 2 (two) times daily as needed (rash).     omeprazole (PRILOSEC) 40 MG capsule TAKE 1 CAPSULE (40 MG TOTAL) BY MOUTH DAILY. 90 capsule 1   OVER THE COUNTER MEDICATION Calcium one daily     OVER THE COUNTER MEDICATION Iron 65 mg one bid     VICTOZA 18 MG/3ML SOPN INJECT 1.8 MG UNDER THE SKIN ONCE DAILY 9 mL 2   VITAMIN D PO Take 1 capsule by mouth daily.     No current facility-administered medications for this visit.    Allergies as of 07/06/2022 - Review Complete 07/06/2022  Allergen Reaction Noted   Morphine and related Itching 03/13/2018   Other Other (See Comments) 03/01/2012   Oxycodone-acetaminophen Other (See Comments) 03/01/2012   Latex Itching and Other (See Comments) 03/01/2012    Family History  Problem Relation Age of Onset   Thyroid disease Mother    Hypertension Mother    Heart attack Mother    Stroke Mother    Heart failure Mother    Diabetes Mother    Emphysema Mother    Cancer Mother    Diabetes Father    Heart attack Father    Stroke Father    Kidney disease Father    Heart failure Father     Social History   Socioeconomic History   Marital status: Widowed    Spouse name: Not on file   Number of children: Not on file   Years of education:  Not on file   Highest education level: Not on file  Occupational History   Not on file  Tobacco Use   Smoking status: Former    Types: Cigarettes    Quit date: 10/29/2020    Years since quitting: 1.6   Smokeless tobacco: Never   Tobacco comments:    Uses nicotine patches. Has not smoked since new years eve 2021-  06/30/21  Vaping Use   Vaping Use: Never used  Substance and Sexual Activity   Alcohol use: Never  Drug use: Never   Sexual activity: Not on file  Other Topics Concern   Not on file  Social History Narrative   Not on file   Social Determinants of Health   Financial Resource Strain: Low Risk  (07/15/2021)   Overall Financial Resource Strain (CARDIA)    Difficulty of Paying Living Expenses: Not hard at all  Food Insecurity: No Food Insecurity (07/15/2021)   Hunger Vital Sign    Worried About Running Out of Food in the Last Year: Never true    Ran Out of Food in the Last Year: Never true  Transportation Needs: No Transportation Needs (07/15/2021)   PRAPARE - Hydrologist (Medical): No    Lack of Transportation (Non-Medical): No  Physical Activity: Inactive (07/15/2021)   Exercise Vital Sign    Days of Exercise per Week: 0 days    Minutes of Exercise per Session: 0 min  Stress: No Stress Concern Present (07/15/2021)   [place]    Feeling of Stress : Not at all  Social Connections: Socially Isolated (07/15/2021)   Social Connection and Isolation Panel [NHANES]    Frequency of Communication with Friends and Family: More than three times a week    Frequency of Social Gatherings with Friends and Family: Never    Attends Religious Services: Never    Marine scientist or Organizations: No    Attends Archivist Meetings: Never    Marital Status: Widowed    Review of systems General: negative for malaise, night sweats, fever, chills, weight loss Neck: Negative  for lumps, goiter, pain and significant neck swelling Resp: Negative for cough, wheezing, dyspnea at rest CV: Negative for chest pain, leg swelling, palpitations, orthopnea GI: denies melena, hematochezia, nausea, vomiting, diarrhea, constipation, dysphagia, odyonophagia, early satiety or unintentional weight loss.  MSK: Negative for joint pain or swelling, back pain, and muscle pain. Derm: Negative for itching or rash Psych: Denies depression, anxiety, memory loss, confusion. No homicidal or suicidal ideation.  Heme: Negative for prolonged bleeding, bruising easily, and swollen nodes. Endocrine: Negative for cold or heat intolerance, polyuria, polydipsia and goiter. Neuro: negative for tremor, gait imbalance, syncope and seizures. The remainder of the review of systems is noncontributory.  Physical Exam: BP 127/81 (BP Location: Left Arm, Patient Position: Sitting, Cuff Size: Large)   Pulse 89   Temp 98 F (36.7 C) (Oral)   Ht 5' (1.524 m)   Wt 203 lb 8 oz (92.3 kg)   BMI 39.74 kg/m  General:   Alert and oriented. No distress noted. Pleasant and cooperative.  Head:  Normocephalic and atraumatic. Eyes:  Conjuctiva clear without scleral icterus. Mouth:  Oral mucosa pink and moist. Good dentition. No lesions. Heart: Normal rate and rhythm, s1 and s2 heart sounds present.  Lungs: Clear lung sounds in all lobes. Respirations equal and unlabored. Abdomen:  +BS, soft, non-tender and non-distended. No rebound or guarding. No HSM or masses noted. Derm: No palmar erythema or jaundice Msk:  Symmetrical without gross deformities. Normal posture. Extremities:  Without edema. Neurologic:  Alert and  oriented x4 Psych:  Alert and cooperative. Normal mood and affect.  Invalid input(s): "6 MONTHS"   ASSESSMENT: Christine Weaver is a 56 y.o. female presenting today for IDA.  Iron deficiency anemia noted on recent labs in June with hgb 7.7, iron 37, iron sat 10%, TIBC 385, ferritin 20, b 12 297.  Last hgb on 8/4 12.2  patient reports she was not on iron at that time. Currently on PO iron. She has no GI complaints, notes no changes in bowel habits, rectal bleeding or melena. Denies worsening SOB or fatigue, states she has these at baseline. As she had a recent colonoscopy, I recommended proceeding with EGD for further evaluation of source of blood loss in her upper GI tract. Patient states she does not understand why this was not done at time of colonoscopy, I discussed with her that a positive cologuard is only followed with a colonoscopy for further evaluation unless IDA present, which it was not at that time. She tells me she has had IDA in the past and was just given iron, required no further evaluation. I had a thorough discussion with the patient regarding guidelines and best practice of proceeding with endoscopic evaluation for IDA to determine if there is a source of blood loss in the GI tract contributing to this. Patient states she does not understand why this is necessary, and why we cannot just repeat labs, I discussed that evaluation of the cause of her IDA is important and EGD is the next step in this evaluation, she also notes she does not have ongoing transportation and therefore is not sure when she would be able to do this. She would like to discuss further with her friend who is a doctor about if she should proceed with EGD. She will let me know what she decides.   PLAN:  Continue iron pills  2. Recommend proceeding with EGD, pt to let me know if she decides to go ahead with this  3. Pt to make me aware of any GI symptoms  All questions were answered, patient verbalized understanding and is in agreement with plan as outlined above.   Follow Up: TBD  Jurline Folger L. Alver Sorrow, MSN, APRN, AGNP-C Adult-Gerontology Nurse Practitioner [facility]

## 2022-07-06 NOTE — Patient Instructions (Signed)
As we discussed, given your recent iron deficiency and significantly low hemoglobin, we recommend proceeding with an EGD (you had a recent colonoscopy, otherwise we would do both) for further evaluation of the GI tract to see if there is a source of blood loss here, you can have microscopic amounts of blood loss that contribute to iron deficiency anemia, meaning you may not even see any blood in your stools. You can think about this and discuss with your family and let me know what you decide to do.   Follow up as needed

## 2022-07-07 ENCOUNTER — Other Ambulatory Visit: Payer: Self-pay | Admitting: "Endocrinology

## 2022-07-07 DIAGNOSIS — Z794 Long term (current) use of insulin: Secondary | ICD-10-CM | POA: Diagnosis not present

## 2022-07-07 DIAGNOSIS — E119 Type 2 diabetes mellitus without complications: Secondary | ICD-10-CM | POA: Diagnosis not present

## 2022-07-08 LAB — COMPREHENSIVE METABOLIC PANEL
ALT: 16 IU/L (ref 0–32)
AST: 15 IU/L (ref 0–40)
Albumin/Globulin Ratio: 1.3 (ref 1.2–2.2)
Albumin: 3.7 g/dL — ABNORMAL LOW (ref 3.8–4.9)
Alkaline Phosphatase: 110 IU/L (ref 44–121)
BUN/Creatinine Ratio: 12 (ref 9–23)
BUN: 15 mg/dL (ref 6–24)
Bilirubin Total: 0.3 mg/dL (ref 0.0–1.2)
CO2: 25 mmol/L (ref 20–29)
Calcium: 9.9 mg/dL (ref 8.7–10.2)
Chloride: 101 mmol/L (ref 96–106)
Creatinine, Ser: 1.25 mg/dL — ABNORMAL HIGH (ref 0.57–1.00)
Globulin, Total: 2.9 g/dL (ref 1.5–4.5)
Glucose: 100 mg/dL — ABNORMAL HIGH (ref 70–99)
Potassium: 5 mmol/L (ref 3.5–5.2)
Sodium: 140 mmol/L (ref 134–144)
Total Protein: 6.6 g/dL (ref 6.0–8.5)
eGFR: 51 mL/min/{1.73_m2} — ABNORMAL LOW (ref >59)

## 2022-07-11 ENCOUNTER — Encounter: Payer: Self-pay | Admitting: "Endocrinology

## 2022-07-11 ENCOUNTER — Ambulatory Visit (INDEPENDENT_AMBULATORY_CARE_PROVIDER_SITE_OTHER): Payer: Medicare Other | Admitting: "Endocrinology

## 2022-07-11 ENCOUNTER — Ambulatory Visit: Payer: Medicaid Other | Admitting: "Endocrinology

## 2022-07-11 VITALS — BP 126/76 | HR 88 | Ht 60.0 in | Wt 204.2 lb

## 2022-07-11 DIAGNOSIS — I10 Essential (primary) hypertension: Secondary | ICD-10-CM | POA: Diagnosis not present

## 2022-07-11 DIAGNOSIS — G894 Chronic pain syndrome: Secondary | ICD-10-CM | POA: Diagnosis not present

## 2022-07-11 DIAGNOSIS — E1142 Type 2 diabetes mellitus with diabetic polyneuropathy: Secondary | ICD-10-CM | POA: Diagnosis not present

## 2022-07-11 DIAGNOSIS — E782 Mixed hyperlipidemia: Secondary | ICD-10-CM | POA: Diagnosis not present

## 2022-07-11 DIAGNOSIS — M545 Low back pain, unspecified: Secondary | ICD-10-CM | POA: Diagnosis not present

## 2022-07-11 DIAGNOSIS — R202 Paresthesia of skin: Secondary | ICD-10-CM | POA: Diagnosis not present

## 2022-07-11 DIAGNOSIS — Z79891 Long term (current) use of opiate analgesic: Secondary | ICD-10-CM | POA: Diagnosis not present

## 2022-07-11 DIAGNOSIS — Z794 Long term (current) use of insulin: Secondary | ICD-10-CM

## 2022-07-11 DIAGNOSIS — Z6839 Body mass index (BMI) 39.0-39.9, adult: Secondary | ICD-10-CM

## 2022-07-11 DIAGNOSIS — M5432 Sciatica, left side: Secondary | ICD-10-CM | POA: Diagnosis not present

## 2022-07-11 DIAGNOSIS — G8929 Other chronic pain: Secondary | ICD-10-CM | POA: Diagnosis not present

## 2022-07-11 DIAGNOSIS — M542 Cervicalgia: Secondary | ICD-10-CM | POA: Diagnosis not present

## 2022-07-11 DIAGNOSIS — E119 Type 2 diabetes mellitus without complications: Secondary | ICD-10-CM

## 2022-07-11 DIAGNOSIS — M5431 Sciatica, right side: Secondary | ICD-10-CM | POA: Diagnosis not present

## 2022-07-11 DIAGNOSIS — M5136 Other intervertebral disc degeneration, lumbar region: Secondary | ICD-10-CM | POA: Diagnosis not present

## 2022-07-11 LAB — POCT GLYCOSYLATED HEMOGLOBIN (HGB A1C): HbA1c, POC (controlled diabetic range): 6.7 % (ref 0.0–7.0)

## 2022-07-11 MED ORDER — METFORMIN HCL 500 MG PO TABS: 500.0000 mg | ORAL_TABLET | Freq: Two times a day (BID) | ORAL | 2 refills | Status: DC

## 2022-07-11 MED ORDER — LANTUS SOLOSTAR 100 UNIT/ML ~~LOC~~ SOPN
70.0000 [IU] | PEN_INJECTOR | Freq: Every day | SUBCUTANEOUS | 2 refills | Status: DC
Start: 2022-07-11 — End: 2022-09-26

## 2022-07-11 MED ORDER — INSULIN LISPRO (1 UNIT DIAL) 100 UNIT/ML (KWIKPEN): 5.0000 [IU] | PEN_INJECTOR | Freq: Three times a day (TID) | SUBCUTANEOUS | 1 refills | Status: DC

## 2022-07-11 NOTE — Patient Instructions (Signed)

## 2022-07-11 NOTE — Progress Notes (Signed)
07/11/2022, 4:22 PM  Endocrinology follow-up note   Subjective:    Patient ID: Christine Weaver, female    DOB: 1966-03-18.  Christine Weaver is being seen in follow-up after she was seen in consultation for management of currently uncontrolled symptomatic diabetes requested by  Lindell Spar, MD.   Past Medical History:  Diagnosis Date   Asthma    Bell's palsy    COPD (chronic obstructive pulmonary disease) ([place])    Hyperlipidemia    Hypertension    Lyme disease    Type 2 diabetes mellitus ([place])     Past Surgical History:  Procedure Laterality Date   CESAREAN SECTION     COLONOSCOPY WITH PROPOFOL N/A 12/16/2021   Procedure: COLONOSCOPY WITH PROPOFOL;  Surgeon: Harvel Quale, MD;  [facility];  Service: Gastroenterology;  Laterality: N/A;  [place]  12/16/2021   Procedure: HOT HEMOSTASIS (ARGON PLASMA COAGULATION/BICAP);  Surgeon: Montez Morita, Quillian Quince, MD;  Location: [facility];  Service: Gastroenterology;;   POLYPECTOMY  12/16/2021   Procedure: POLYPECTOMY;  Surgeon: Harvel Quale, MD;  [facility]: [facility];  Service: Gastroenterology;;   RENAL BIOPSY  06/02/2022   SUBMUCOSAL TATTOO INJECTION  12/16/2021   Procedure: SUBMUCOSAL TATTOO INJECTION;  Surgeon: Harvel Quale, MD;  [facility]: [facility];  Service: Gastroenterology;;    Social History   Socioeconomic History   Marital status: Widowed    Spouse name: Not on file   Number of children: Not on file   Years of education: Not on file   Highest education level: Not on file  Occupational History   Not on file  Tobacco Use   Smoking status: Former    Types: Cigarettes    Quit date: 10/29/2020    Years since quitting: 1.6   Smokeless tobacco: Never   Tobacco comments:    Uses nicotine patches. Has not smoked since new years eve 2021-  06/30/21  Vaping Use   Vaping Use: Never used  Substance and Sexual Activity    Alcohol use: Never   Drug use: Never   Sexual activity: Not on file  Other Topics Concern   Not on file  Social History Narrative   Not on file   Social Determinants of Health   Financial Resource Strain: Low Risk  (07/15/2021)   Overall Financial Resource Strain (CARDIA)    Difficulty of Paying Living Expenses: Not hard at all  Food Insecurity: No Food Insecurity (07/15/2021)   Hunger Vital Sign    Worried About Running Out of Food in the Last Year: Never true    Ran Out of Food in the Last Year: Never true  Transportation Needs: No Transportation Needs (07/15/2021)   PRAPARE - Hydrologist (Medical): No    Lack of Transportation (Non-Medical): No  Physical Activity: Inactive (07/15/2021)   Exercise Vital Sign    Days of Exercise per Week: 0 days    Minutes of Exercise per Session: 0 min  Stress: No Stress Concern Present (07/15/2021)   [place]    Feeling of Stress : Not at all  Social Connections: Socially Isolated (07/15/2021)   Social Connection and Isolation Panel [NHANES]    Frequency of Communication with Friends and Family: More than three times a week    Frequency of Social Gatherings with Friends and Family: Never    Attends Religious Services:  Never    Active Member of Clubs or Organizations: No    Attends Archivist Meetings: Never    Marital Status: Widowed    Family History  Problem Relation Age of Onset   Thyroid disease Mother    Hypertension Mother    Heart attack Mother    Stroke Mother    Heart failure Mother    Diabetes Mother    Emphysema Mother    Cancer Mother    Diabetes Father    Heart attack Father    Stroke Father    Kidney disease Father    Heart failure Father     Outpatient Encounter Medications as of 07/11/2022  Medication Sig   albuterol (VENTOLIN HFA) 108 (90 Base) MCG/ACT inhaler Inhale 2 puffs into the lungs every 4 (four)  hours as needed for wheezing or shortness of breath.   amLODipine (NORVASC) 10 MG tablet TAKE 1 TABLET BY MOUTH EVERY DAY   ANORO ELLIPTA 62.5-25 MCG/ACT AEPB TAKE 1 PUFF BY MOUTH EVERY DAY   atorvastatin (LIPITOR) 80 MG tablet TAKE 1 TABLET BY MOUTH EVERY DAY   BD PEN NEEDLE NANO 2ND GEN 32G X 4 MM MISC USE 1 PEN NEEDLE FOR 6 INJECTIONS DAILY FOR DIABETES E11.9   budesonide-formoterol (SYMBICORT) 80-4.5 MCG/ACT inhaler TAKE 2 PUFFS FIRST THING IN AM AND THEN ANOTHER 2 PUFFS ABOUT 12 HOURS LATER   Continuous Blood Gluc Receiver (FREESTYLE LIBRE 2 READER) DEVI As directed   diclofenac Sodium (VOLTAREN) 1 % GEL Apply 1 Application topically 4 (four) times daily as needed (pain).   famotidine (PEPCID) 20 MG tablet TAKE 1 TABLET AFTER SUPPER   FARXIGA 5 MG TABS tablet Take 5 mg by mouth every other day.   Ferrous Sulfate (IRON PO) Take 1 tablet by mouth daily.   fluticasone (FLONASE) 50 MCG/ACT nasal spray Place 1 spray into both nostrils daily as needed for allergies.   furosemide (LASIX) 20 MG tablet TAKE 1 TABLET BY MOUTH TWICE A DAY   glucose blood (ACCU-CHEK AVIVA PLUS) test strip    HYDROcodone-acetaminophen (NORCO/VICODIN) 5-325 MG tablet Take 1 tablet by mouth every 12 (twelve) hours as needed for severe pain.   insulin glargine (LANTUS SOLOSTAR) 100 UNIT/ML Solostar Pen Inject 70 Units into the skin at bedtime.   insulin lispro (HUMALOG KWIKPEN) 100 UNIT/ML KwikPen Inject 5-11 Units into the skin 3 (three) times daily.   irbesartan (AVAPRO) 75 MG tablet Take 1 tablet (75 mg total) by mouth daily.   metFORMIN (GLUCOPHAGE) 500 MG tablet Take 1 tablet (500 mg total) by mouth 2 (two) times daily with a meal.   nystatin cream (MYCOSTATIN) Apply 1 application  topically 2 (two) times daily as needed (rash).   omeprazole (PRILOSEC) 40 MG capsule TAKE 1 CAPSULE (40 MG TOTAL) BY MOUTH DAILY.   OVER THE COUNTER MEDICATION Calcium one daily   OVER THE COUNTER MEDICATION Iron 65 mg one bid   VICTOZA  18 MG/3ML SOPN INJECT 1.8 MG UNDER THE SKIN ONCE DAILY   VITAMIN D PO Take 1 capsule by mouth daily.   [DISCONTINUED] HUMALOG KWIKPEN 100 UNIT/ML KwikPen INJECT 10 UNITS INTO THE SKIN 3 TIMES A DAY BEFORE MEALS (Patient taking differently: Inject 10-20 Units into the skin 3 (three) times daily before meals.)   [DISCONTINUED] LANTUS SOLOSTAR 100 UNIT/ML Solostar Pen INJECT 80 UNITS INTO THE SKIN AT BEDTIME.   [DISCONTINUED] metFORMIN (GLUCOPHAGE) 1000 MG tablet TAKE 1 TABLET TWICE A DAY WITH FOOD   No facility-administered  encounter medications on file as of 07/11/2022.    ALLERGIES: Allergies  Allergen Reactions   Morphine And Related Itching   Other Other (See Comments)    Bells Palsy    Oxycodone-Acetaminophen Other (See Comments)    Constipates patient    Latex Itching and Other (See Comments)    Skin "cracks" open     VACCINATION STATUS: Immunization History  Administered Date(s) Administered   Moderna SARS-COV2 Booster Vaccination 09/03/2021   Moderna Sars-Covid-2 Vaccination 12/11/2019, 11/13/2020   PNEUMOCOCCAL CONJUGATE-20 10/06/2021   Tdap 03/13/2018   Zoster Recombinat (Shingrix) 10/06/2021, 02/06/2022    Diabetes She presents for her follow-up diabetic visit. She has type 2 diabetes mellitus. Onset time: She was diagnosed at approximately age of 87 years. Her disease course has been improving. There are no hypoglycemic associated symptoms. Pertinent negatives for hypoglycemia include no confusion, headaches, pallor or seizures. Pertinent negatives for diabetes include no chest pain, no fatigue, no polydipsia, no polyphagia and no polyuria. There are no hypoglycemic complications. Symptoms are improving. Risk factors for coronary artery disease include dyslipidemia, diabetes mellitus, hypertension, obesity, sedentary lifestyle and tobacco exposure. Current diabetic treatment includes insulin injections. Her weight is fluctuating minimally. She is following a generally  unhealthy diet. When asked about meal planning, she reported none. She has not had a previous visit with a dietitian. She never participates in exercise. Her home blood glucose trend is decreasing steadily. Her breakfast blood glucose range is generally 130-140 mg/dl. Her lunch blood glucose range is generally 130-140 mg/dl. Her dinner blood glucose range is generally 130-140 mg/dl. Her bedtime blood glucose range is generally 130-140 mg/dl. Her overall blood glucose range is 130-140 mg/dl. (Jilian presents with controlled glycemic profile.  Her point-of-care A1c is 6.7%.  She presents with her CGM showing 84% time range, 14% level 1 hyperglycemia.  2% level 2 hyperglycemia.  She did not report any significant hypoglycemia.) An ACE inhibitor/angiotensin II receptor blocker is being taken. Eye exam is current.  Hyperlipidemia This is a chronic problem. The problem is uncontrolled. Recent lipid tests were reviewed and are high. Exacerbating diseases include diabetes and obesity. Pertinent negatives include no chest pain, myalgias or shortness of breath. Current antihyperlipidemic treatment includes statins. Risk factors for coronary artery disease include dyslipidemia, diabetes mellitus, family history, hypertension, obesity and a sedentary lifestyle.  Hypertension This is a chronic problem. The current episode started more than 1 year ago. The problem is uncontrolled. Pertinent negatives include no chest pain, headaches, palpitations or shortness of breath. Risk factors for coronary artery disease include diabetes mellitus, dyslipidemia, obesity, sedentary lifestyle, smoking/tobacco exposure and family history. Past treatments include ACE inhibitors.   Review of systems: Limited as above.    Objective:       07/11/2022    3:25 PM 07/06/2022   12:31 PM 06/08/2022    1:37 PM  Vitals with BMI  Height '5\' 0"'$  '5\' 0"'$  '5\' 0"'$   Weight 204 lbs 3 oz 203 lbs 8 oz 207 lbs 13 oz  BMI 39.88 89.38 10.17  Systolic 510  258 527  Diastolic 76 81 52  Pulse 88 89 97    BP 126/76   Pulse 88   Ht 5' (1.524 m)   Wt 204 lb 3.2 oz (92.6 kg)   BMI 39.88 kg/m   Wt Readings from Last 3 Encounters:  07/11/22 204 lb 3.2 oz (92.6 kg)  07/06/22 203 lb 8 oz (92.3 kg)  06/08/22 207 lb 12.8 oz (94.3 kg)  CMP ( most recent) CMP     Component Value Date/Time   NA 140 07/07/2022 0941   K 5.0 07/07/2022 0941   CL 101 07/07/2022 0941   CO2 25 07/07/2022 0941   GLUCOSE 100 (H) 07/07/2022 0941   GLUCOSE 184 (H) 12/14/2021 0901   BUN 15 07/07/2022 0941   CREATININE 1.25 (H) 07/07/2022 0941   CALCIUM 9.9 07/07/2022 0941   PROT 6.6 07/07/2022 0941   ALBUMIN 3.7 (L) 07/07/2022 0941   AST 15 07/07/2022 0941   ALT 16 07/07/2022 0941   ALKPHOS 110 07/07/2022 0941   BILITOT 0.3 07/07/2022 0941   GFRNONAA >60 12/14/2021 0901   GFRAA 80 11/11/2020 0817    Diabetic Labs (most recent): Lab Results  Component Value Date   HGBA1C 6.7 07/11/2022   HGBA1C 7.4 (A) 03/07/2022   HGBA1C 7.8 (H) 10/13/2021     Lipid Panel ( most recent) Lipid Panel     Component Value Date/Time   CHOL 208 (H) 04/06/2022 1131   TRIG 261 (H) 04/06/2022 1131   HDL 41 04/06/2022 1131   CHOLHDL 5.1 (H) 04/06/2022 1131   LDLCALC 121 (H) 04/06/2022 1131   LABVLDL 46 (H) 04/06/2022 1131      Lab Results  Component Value Date   TSH 3.110 04/06/2022   TSH 1.210 10/13/2021   TSH 1.730 01/27/2021   TSH 2.640 11/11/2020   TSH 1.42 09/23/2019   FREET4 1.25 04/06/2022   FREET4 1.25 01/27/2021   FREET4 1.28 11/11/2020      Assessment & Plan:   1. Type 2 diabetes mellitus with hyperglycemia, with long-term current use of insulin (HCC)  - [place] has currently uncontrolled symptomatic type 2 DM since  56 years of age.  Anjolina presents with controlled glycemic profile.  Her point-of-care A1c is 6.7%.  She presents with her CGM showing 84% time range, 14% level 1 hyperglycemia.  2% level 2 hyperglycemia.  She did not report  any significant hypoglycemia.   Recent labs reviewed.  - I had a long discussion with her about the progressive nature of diabetes and the pathology behind its complications. -her diabetes is complicated by obesity/sedentary life, smoking and she remains at a high risk for more acute and chronic complications which include CAD, CVA, CKD, retinopathy, and neuropathy. These are all discussed in detail with her.  - I have counseled her on diet  and weight management  by adopting a carbohydrate restricted/protein rich diet. Patient is encouraged to switch to  unprocessed or minimally processed     complex starch and increased protein intake (animal or plant source), fruits, and vegetables. -  she is advised to stick to a routine mealtimes to eat 3 meals  a day and avoid unnecessary snacks ( to snack only to correct hypoglycemia).  - she acknowledges that there is a room for improvement in her food and drink choices. - Suggestion is made for her to avoid simple carbohydrates  from her diet including Cakes, Sweet Desserts, Ice Cream, Soda (diet and regular), Sweet Tea, Candies, Chips, Cookies, Store Bought Juices, Alcohol in Excess of  1-2 drinks a day, Artificial Sweeteners,  Coffee Creamer, and "Sugar-free" Products, Lemonade. This will help patient to have more stable blood glucose profile and potentially avoid unintended weight gain.  - she will be scheduled with Jearld Fenton, RDN, CDE for diabetes education.  - I have approached her with the following individualized plan to manage  her diabetes and patient agrees:   -She  presents with significant improvement in her glycemic profile.   She will continue to need intensive treatment with basal/bolus insulin in order for her to maintain control of diabetes to target.    She is advised to lower her Lantus to 70 units nightly, lower her Humalog to 5 units  3 times a day with meals  for pre-meal BG readings of 90-'150mg'$ /dl, plus patient specific  correction dose for unexpected hyperglycemia above '150mg'$ /dl, associated with strict monitoring of glucose 4 times a day-before meals and at bedtime. -He is advised to wear her CGM at all times.  - she is warned not to take insulin without proper monitoring per orders. - Adjustment parameters are given to her for hypo and hyperglycemia in writing. - she is encouraged to call clinic for blood glucose levels less than 70 or above 200 mg /dl. - she is advised to lower metformin to 500 mg p.o. twice daily.  She is advised to continue Farxiga 5 mg p.o. daily at breakfast started by her nephrologist.  She is also advised to continue Victoza 1.8 mg subcutaneously daily.    - Specific targets for  A1c;  LDL, HDL,  and Triglycerides were discussed with the patient.  2) Blood Pressure /Hypertension:   -Her blood pressure is controlled to target.   she is advised to continue her current medications including lisinopril 20 mg p.o. daily with breakfast .  3) Lipids/Hyperlipidemia:   Review of her recent lipid panel showed un controlled  LDL at 121.  She is advised to continue atorvastatin 80 mg nightly.  Whole food plant-based diet was discussed and recommended to her.   Side effects and precautions discussed with her.  4)  Weight/Diet: Her BMI is 39.88 -  -  clearly complicating her diabetes care.   she is  a candidate for weight loss. I discussed with her the fact that loss of 5 - 10% of her  current body weight will have the most impact on her diabetes management.  Exercise, and detailed carbohydrates information provided  -  detailed on discharge instructions.  5) Chronic Care/Health Maintenance:  -she  is on ACEI/ARB and Statin medications and  is encouraged to initiate and continue to follow up with Ophthalmology, Dentist,  Podiatrist at least yearly or according to recommendations, and advised to  quit smoking. I have recommended yearly flu vaccine and pneumonia vaccine at least every 5 years; moderate  intensity exercise for up to 150 minutes weekly; and  sleep for at least 7 hours a day.  Her recent screening ABI was negative for PAD in February 2022.  Her next study will be due in February 2027 or sooner if needed.   - she is  advised to maintain close follow up with Lindell Spar, MD for primary care needs, as well as her other providers for optimal and coordinated care.   I spent 33 minutes in the care of the patient today including review of labs from [place], Lipids, Thyroid Function, Hematology (current and previous including abstractions from other facilities); face-to-face time discussing  her blood glucose readings/logs, discussing hypoglycemia and hyperglycemia episodes and symptoms, medications doses, her options of short and long term treatment based on the latest standards of care / guidelines;  discussion about incorporating lifestyle medicine;  and documenting the encounter. Risk reduction counseling performed per USPSTF guidelines to reduce  obesity and cardiovascular risk factors.     Please refer to Patient Instructions for Blood Glucose Monitoring and Insulin/Medications Dosing Guide"  in media tab for additional information. Please  also refer to " Patient Self Inventory" in the Media  tab for reviewed elements of pertinent patient history.  Jaylianna Maron participated in the discussions, expressed understanding, and voiced agreement with the above plans.  All questions were answered to her satisfaction. she is encouraged to contact clinic should she have any questions or concerns prior to her return visit.    Follow up plan: - Return in about 4 months (around 11/10/2022) for F/U with Pre-visit Labs, Meter/CGM/Logs, A1c here.  Glade Lloyd, MD [facility] [address] [place], [place] [address] Phone: [phone]  Fax: [phone]    07/11/2022, 4:22 PM  This note was partially dictated with voice recognition  software. Similar sounding words can be transcribed inadequately or may not  be corrected upon review.

## 2022-07-16 ENCOUNTER — Other Ambulatory Visit: Payer: Self-pay | Admitting: Internal Medicine

## 2022-07-16 DIAGNOSIS — J449 Chronic obstructive pulmonary disease, unspecified: Secondary | ICD-10-CM

## 2022-07-16 DIAGNOSIS — I10 Essential (primary) hypertension: Secondary | ICD-10-CM

## 2022-07-17 ENCOUNTER — Ambulatory Visit (INDEPENDENT_AMBULATORY_CARE_PROVIDER_SITE_OTHER): Payer: Medicare Other

## 2022-07-17 DIAGNOSIS — J449 Chronic obstructive pulmonary disease, unspecified: Secondary | ICD-10-CM | POA: Diagnosis not present

## 2022-07-17 DIAGNOSIS — I509 Heart failure, unspecified: Secondary | ICD-10-CM | POA: Diagnosis not present

## 2022-07-17 DIAGNOSIS — Z0000 Encounter for general adult medical examination without abnormal findings: Secondary | ICD-10-CM

## 2022-07-17 NOTE — Progress Notes (Signed)
Subjective:   Christine Weaver is a 56 y.o. female who presents for Medicare Annual (Subsequent) preventive examination.  Review of Systems    I connected with  Megumi Osterman on 07/17/22 by a audio enabled telemedicine application and verified that I am speaking with the correct person using two identifiers.  Patient Location: Home  Provider Location: Office/Clinic  I discussed the limitations of evaluation and management by telemedicine. The patient expressed understanding and agreed to proceed.        Objective:    There were no vitals filed for this visit. There is no height or weight on file to calculate BMI.     12/16/2021    7:52 AM 07/15/2021    2:10 PM 12/10/2020    4:45 PM 12/09/2020    9:50 AM 06/19/2018   10:24 AM  Advanced Directives  Does Patient Have a Medical Advance Directive? No No No No No  Would patient like information on creating a medical advance directive? No - Patient declined        Current Medications (verified) Outpatient Encounter Medications as of 07/17/2022  Medication Sig   albuterol (VENTOLIN HFA) 108 (90 Base) MCG/ACT inhaler Inhale 2 puffs into the lungs every 4 (four) hours as needed for wheezing or shortness of breath.   amLODipine (NORVASC) 10 MG tablet TAKE 1 TABLET BY MOUTH EVERY DAY   ANORO ELLIPTA 62.5-25 MCG/ACT AEPB INHALE 1 PUFF BY MOUTH EVERY DAY   atorvastatin (LIPITOR) 80 MG tablet TAKE 1 TABLET BY MOUTH EVERY DAY   BD PEN NEEDLE NANO 2ND GEN 32G X 4 MM MISC USE 1 PEN NEEDLE FOR 6 INJECTIONS DAILY FOR DIABETES E11.9   budesonide-formoterol (SYMBICORT) 80-4.5 MCG/ACT inhaler TAKE 2 PUFFS FIRST THING IN AM AND THEN ANOTHER 2 PUFFS ABOUT 12 HOURS LATER   Continuous Blood Gluc Receiver (FREESTYLE LIBRE 2 READER) DEVI As directed   diclofenac Sodium (VOLTAREN) 1 % GEL Apply 1 Application topically 4 (four) times daily as needed (pain).   famotidine (PEPCID) 20 MG tablet TAKE 1 TABLET AFTER SUPPER   FARXIGA 5 MG TABS tablet Take 5 mg by  mouth every other day.   Ferrous Sulfate (IRON PO) Take 1 tablet by mouth daily.   fluticasone (FLONASE) 50 MCG/ACT nasal spray Place 1 spray into both nostrils daily as needed for allergies.   furosemide (LASIX) 20 MG tablet TAKE 1 TABLET BY MOUTH TWICE A DAY   glucose blood (ACCU-CHEK AVIVA PLUS) test strip    HYDROcodone-acetaminophen (NORCO/VICODIN) 5-325 MG tablet Take 1 tablet by mouth every 12 (twelve) hours as needed for severe pain.   insulin glargine (LANTUS SOLOSTAR) 100 UNIT/ML Solostar Pen Inject 70 Units into the skin at bedtime.   insulin lispro (HUMALOG KWIKPEN) 100 UNIT/ML KwikPen Inject 5-11 Units into the skin 3 (three) times daily.   irbesartan (AVAPRO) 75 MG tablet Take 1 tablet (75 mg total) by mouth daily.   metFORMIN (GLUCOPHAGE) 500 MG tablet Take 1 tablet (500 mg total) by mouth 2 (two) times daily with a meal.   nystatin cream (MYCOSTATIN) Apply 1 application  topically 2 (two) times daily as needed (rash).   omeprazole (PRILOSEC) 40 MG capsule TAKE 1 CAPSULE (40 MG TOTAL) BY MOUTH DAILY.   OVER THE COUNTER MEDICATION Calcium one daily   OVER THE COUNTER MEDICATION Iron 65 mg one bid   VICTOZA 18 MG/3ML SOPN INJECT 1.8 MG UNDER THE SKIN ONCE DAILY   VITAMIN D PO Take 1 capsule by mouth daily.  No facility-administered encounter medications on file as of 07/17/2022.    Allergies (verified) Morphine and related, Other, Oxycodone-acetaminophen, and Latex   History: Past Medical History:  Diagnosis Date   Asthma    Bell's palsy    COPD (chronic obstructive pulmonary disease) ([place])    Hyperlipidemia    Hypertension    Lyme disease    Type 2 diabetes mellitus ([place])    Past Surgical History:  Procedure Laterality Date   CESAREAN SECTION     COLONOSCOPY WITH PROPOFOL N/A 12/16/2021   Procedure: COLONOSCOPY WITH PROPOFOL;  Surgeon: Harvel Quale, MD;  Location: [facility];  Service: Gastroenterology;  Laterality: N/A;  [place]   12/16/2021   Procedure: HOT HEMOSTASIS (ARGON PLASMA COAGULATION/BICAP);  Surgeon: Montez Morita, Quillian Quince, MD;  Location: [facility];  Service: Gastroenterology;;   POLYPECTOMY  12/16/2021   Procedure: POLYPECTOMY;  Surgeon: Harvel Quale, MD;  Location: [facility];  Service: Gastroenterology;;   RENAL BIOPSY  06/02/2022   SUBMUCOSAL TATTOO INJECTION  12/16/2021   Procedure: SUBMUCOSAL TATTOO INJECTION;  Surgeon: Harvel Quale, MD;  Location: [facility];  Service: Gastroenterology;;   Family History  Problem Relation Age of Onset   Thyroid disease Mother    Hypertension Mother    Heart attack Mother    Stroke Mother    Heart failure Mother    Diabetes Mother    Emphysema Mother    Cancer Mother    Diabetes Father    Heart attack Father    Stroke Father    Kidney disease Father    Heart failure Father    Social History   Socioeconomic History   Marital status: Widowed    Spouse name: Not on file   Number of children: Not on file   Years of education: Not on file   Highest education level: Not on file  Occupational History   Not on file  Tobacco Use   Smoking status: Former    Types: Cigarettes    Quit date: 10/29/2020    Years since quitting: 1.7   Smokeless tobacco: Never   Tobacco comments:    Uses nicotine patches. Has not smoked since new years eve 2021-  06/30/21  Vaping Use   Vaping Use: Never used  Substance and Sexual Activity   Alcohol use: Never   Drug use: Never   Sexual activity: Not on file  Other Topics Concern   Not on file  Social History Narrative   Not on file   Social Determinants of Health   Financial Resource Strain: Low Risk  (07/15/2021)   Overall Financial Resource Strain (CARDIA)    Difficulty of Paying Living Expenses: Not hard at all  Food Insecurity: No Food Insecurity (07/15/2021)   Hunger Vital Sign    Worried About Running Out of Food in the Last Year: Never true    Ran Out of Food in the Last  Year: Never true  Transportation Needs: No Transportation Needs (07/15/2021)   PRAPARE - Hydrologist (Medical): No    Lack of Transportation (Non-Medical): No  Physical Activity: Inactive (07/15/2021)   Exercise Vital Sign    Days of Exercise per Week: 0 days    Minutes of Exercise per Session: 0 min  Stress: No Stress Concern Present (07/15/2021)   [place]    Feeling of Stress : Not at all  Social Connections: Socially  Isolated (07/15/2021)   Social Connection and Isolation Panel [NHANES]    Frequency of Communication with Friends and Family: More than three times a week    Frequency of Social Gatherings with Friends and Family: Never    Attends Religious Services: Never    Marine scientist or Organizations: No    Attends Archivist Meetings: Never    Marital Status: Widowed    Tobacco Counseling Counseling given: Not Answered Tobacco comments: Uses nicotine patches. Has not smoked since new years eve 2021-  06/30/21   Clinical Intake:              How often do you need to have someone help you when you read instructions, pamphlets, or other written materials from your doctor or pharmacy?: (P) 1 - Never  Diabetic?Yes Nutrition Risk Assessment:  Has the patient had any N/V/D within the last 2 months?  No  Does the patient have any non-healing wounds?  No  Has the patient had any unintentional weight loss or weight gain?  No   Diabetes:  Is the patient diabetic?  Yes  If diabetic, was a CBG obtained today?  No  Did the patient bring in their glucometer from home?  No  How often do you monitor your CBG's? 4 times daily .   Financial Strains and Diabetes Management:  Are you having any financial strains with the device, your supplies or your medication? No .  Does the patient want to be seen by Chronic Care Management for management of their diabetes?  No   Would the patient like to be referred to a Nutritionist or for Diabetic Management?  No   Diabetic Exams:  Diabetic Eye Exam: Completed 05/31/21 Diabetic Foot Exam: Completed 02/06/22           Activities of Daily Living    07/16/2022    9:17 AM  In your present state of health, do you have any difficulty performing the following activities:  Hearing? 0  Vision? 0  Difficulty concentrating or making decisions? 0  Walking or climbing stairs? 0  Dressing or bathing? 0  Doing errands, shopping? 0  Preparing Food and eating ? N  Using the Toilet? N  In the past six months, have you accidently leaked urine? N  Do you have problems with loss of bowel control? N  Managing your Medications? N  Managing your Finances? N  Housekeeping or managing your Housekeeping? N    Patient Care Team: Lindell Spar, MD as PCP - General (Internal Medicine) Satira Sark, MD as PCP - Cardiology (Cardiology)  Indicate any recent Medical Services you may have received from other than [facility] providers in the past year (date may be approximate).     Assessment:   This is a routine wellness examination for Emmersyn.  Hearing/Vision screen No results found.  Dietary issues and exercise activities discussed:     Goals Addressed   None   Depression Screen    06/08/2022    1:39 PM 02/06/2022    1:11 PM 10/06/2021    1:59 PM 07/15/2021    2:08 PM 07/11/2021   10:50 AM 06/02/2021    1:50 PM 02/08/2021    8:12 AM  PHQ 2/9 Scores  PHQ - 2 Score 0 0 0 0 0 0 0  PHQ- 9 Score    0 0      Fall Risk    07/16/2022    9:17 AM 06/08/2022  1:39 PM 02/06/2022    1:10 PM 10/06/2021    1:59 PM 07/15/2021    2:10 PM  [place] in the past year? 0 0 0 0 0  Number falls in past yr:  0 0 0 0  Injury with Fall? 0 0 0 0 0  Risk for fall due to :  No Fall Risks No Fall Risks No Fall Risks No Fall Risks  Follow up  Falls evaluation completed Falls evaluation completed Falls evaluation completed  Falls evaluation completed    [place]:  Any stairs in or around the home? Yes  If so, are there any without handrails? Yes  Home free of loose throw rugs in walkways, pet beds, electrical cords, etc? Yes  Adequate lighting in your home to reduce risk of falls? Yes   ASSISTIVE DEVICES UTILIZED TO PREVENT FALLS:  Life alert? No  Use of a cane, walker or w/c? No  Grab bars in the bathroom? NO Shower chair or bench in shower? No  Elevated toilet seat or a handicapped toilet? No          07/15/2021    2:11 PM  6CIT Screen  What Year? 0 points  What month? 0 points  What time? 0 points  Count back from 20 0 points  Months in reverse 0 points  Repeat phrase 0 points  Total Score 0 points    Immunizations Immunization History  Administered Date(s) Administered   Moderna SARS-COV2 Booster Vaccination 09/03/2021   Moderna Sars-Covid-2 Vaccination 12/11/2019, 11/13/2020   PNEUMOCOCCAL CONJUGATE-20 10/06/2021   Tdap 03/13/2018   Zoster Recombinat (Shingrix) 10/06/2021, 02/06/2022    TDAP status: Up to date  Flu Vaccine status: Due, Education has been provided regarding the importance of this vaccine. Advised may receive this vaccine at local pharmacy or Health Dept. Aware to provide a copy of the vaccination record if obtained from local pharmacy or Health Dept. Verbalized acceptance and understanding.   Covid-19 vaccine status: Completed vaccines  Qualifies for Shingles Vaccine? Yes   Zostavax completed Yes   Shingrix Completed?: Yes  Screening Tests Health Maintenance  Topic Date Due   COVID-19 Vaccine (3 - Moderna risk series) 10/01/2021   INFLUENZA VACCINE  Never done   OPHTHALMOLOGY EXAM  05/31/2022   HEMOGLOBIN A1C  01/09/2023   FOOT EXAM  02/07/2023   Diabetic kidney evaluation - Urine ACR  04/07/2023   MAMMOGRAM  06/21/2023   Diabetic kidney evaluation - GFR measurement  07/08/2023   PAP SMEAR-Modifier  07/11/2024    COLONOSCOPY (Pts 45-49yr Insurance coverage will need to be confirmed)  12/16/2024   TETANUS/TDAP  03/13/2028   Hepatitis C Screening  Completed   HIV Screening  Completed   Zoster Vaccines- Shingrix  Completed   HPV VACCINES  Aged Out    Health Maintenance  Health Maintenance Due  Topic Date Due   COVID-19 Vaccine (3 - Moderna risk series) 10/01/2021   INFLUENZA VACCINE  Never done   OPHTHALMOLOGY EXAM  05/31/2022    Colorectal cancer screening: Type of screening: Colonoscopy. Completed 12/16/21. Repeat every 3 years  Mammogram status: Completed 06/20/21. Repeat every year    Lung Cancer Screening: (Low Dose CT Chest recommended if Age 54-80years, 30 pack-year currently smoking OR have quit w/in 15years.) does qualify.   Lung Cancer Screening Referral: Yes  Additional Screening:  Hepatitis C Screening: does qualify; Completed 01/27/21  Vision Screening: Recommended annual ophthalmology exams for early  detection of glaucoma and other disorders of the eye. Is the patient up to date with their annual eye exam?  Yes  Who is the provider or what is the name of the office in which the patient attends annual eye exams? N/a If pt is not established with a provider, would they like to be referred to a provider to establish care? No .   Dental Screening: Recommended annual dental exams for proper oral hygiene  Community Resource Referral / Chronic Care Management: CRR required this visit?  No   CCM required this visit?  No      Plan:     I have personally reviewed and noted the following in the patient's chart:   Medical and social history Use of alcohol, tobacco or illicit drugs  Current medications and supplements including opioid prescriptions. Patient is not currently taking opioid prescriptions. Functional ability and status Nutritional status Physical activity Advanced directives List of other physicians Hospitalizations, surgeries, and ER visits in previous  12 months Vitals Screenings to include cognitive, depression, and falls Referrals and appointments  In addition, I have reviewed and discussed with patient certain preventive protocols, quality metrics, and best practice recommendations. A written personalized care plan for preventive services as well as general preventive health recommendations were provided to patient.     Quentin Angst, [place]   07/17/2022

## 2022-07-17 NOTE — Patient Instructions (Signed)
  Ms. Hauter , Thank you for taking time to come for your Medicare Wellness Visit. I appreciate your ongoing commitment to your health goals. Please review the following plan we discussed and let me know if I can assist you in the future.   These are the goals we discussed:  Goals      Patient Stated     Work out         This is a list of the screening recommended for you and due dates:  Health Maintenance  Topic Date Due   COVID-19 Vaccine (3 - Moderna risk series) 10/01/2021   Flu Shot  Never done   Eye exam for diabetics  05/31/2022   Hemoglobin A1C  01/09/2023   Complete foot exam   02/07/2023   Yearly kidney health urinalysis for diabetes  04/07/2023   Mammogram  06/21/2023   Yearly kidney function blood test for diabetes  07/08/2023   Pap Smear  07/11/2024   Colon Cancer Screening  12/16/2024   Tetanus Vaccine  03/13/2028   Hepatitis C Screening: USPSTF Recommendation to screen - Ages 18-79 yo.  Completed   HIV Screening  Completed   Zoster (Shingles) Vaccine  Completed   HPV Vaccine  Aged Out

## 2022-07-23 ENCOUNTER — Other Ambulatory Visit: Payer: Self-pay | Admitting: "Endocrinology

## 2022-07-23 DIAGNOSIS — E119 Type 2 diabetes mellitus without complications: Secondary | ICD-10-CM

## 2022-07-26 ENCOUNTER — Other Ambulatory Visit: Payer: Self-pay | Admitting: Internal Medicine

## 2022-07-30 DIAGNOSIS — E119 Type 2 diabetes mellitus without complications: Secondary | ICD-10-CM | POA: Diagnosis not present

## 2022-07-30 DIAGNOSIS — E1165 Type 2 diabetes mellitus with hyperglycemia: Secondary | ICD-10-CM | POA: Diagnosis not present

## 2022-08-03 DIAGNOSIS — E1142 Type 2 diabetes mellitus with diabetic polyneuropathy: Secondary | ICD-10-CM | POA: Diagnosis not present

## 2022-08-03 DIAGNOSIS — G894 Chronic pain syndrome: Secondary | ICD-10-CM | POA: Diagnosis not present

## 2022-08-03 DIAGNOSIS — M5432 Sciatica, left side: Secondary | ICD-10-CM | POA: Diagnosis not present

## 2022-08-03 DIAGNOSIS — M5431 Sciatica, right side: Secondary | ICD-10-CM | POA: Diagnosis not present

## 2022-08-03 DIAGNOSIS — M545 Low back pain, unspecified: Secondary | ICD-10-CM | POA: Diagnosis not present

## 2022-08-03 DIAGNOSIS — R202 Paresthesia of skin: Secondary | ICD-10-CM | POA: Diagnosis not present

## 2022-08-03 DIAGNOSIS — G8929 Other chronic pain: Secondary | ICD-10-CM | POA: Diagnosis not present

## 2022-08-03 DIAGNOSIS — Z79891 Long term (current) use of opiate analgesic: Secondary | ICD-10-CM | POA: Diagnosis not present

## 2022-08-03 DIAGNOSIS — M5136 Other intervertebral disc degeneration, lumbar region: Secondary | ICD-10-CM | POA: Diagnosis not present

## 2022-08-09 ENCOUNTER — Other Ambulatory Visit: Payer: Self-pay | Admitting: Internal Medicine

## 2022-08-09 ENCOUNTER — Other Ambulatory Visit: Payer: Self-pay | Admitting: "Endocrinology

## 2022-08-09 DIAGNOSIS — I10 Essential (primary) hypertension: Secondary | ICD-10-CM

## 2022-08-09 DIAGNOSIS — E119 Type 2 diabetes mellitus without complications: Secondary | ICD-10-CM

## 2022-08-16 DIAGNOSIS — I509 Heart failure, unspecified: Secondary | ICD-10-CM | POA: Diagnosis not present

## 2022-08-16 DIAGNOSIS — J449 Chronic obstructive pulmonary disease, unspecified: Secondary | ICD-10-CM | POA: Diagnosis not present

## 2022-08-30 DIAGNOSIS — E1165 Type 2 diabetes mellitus with hyperglycemia: Secondary | ICD-10-CM | POA: Diagnosis not present

## 2022-09-05 ENCOUNTER — Ambulatory Visit: Payer: Medicare Other | Admitting: Internal Medicine

## 2022-09-16 DIAGNOSIS — J449 Chronic obstructive pulmonary disease, unspecified: Secondary | ICD-10-CM | POA: Diagnosis not present

## 2022-09-16 DIAGNOSIS — I509 Heart failure, unspecified: Secondary | ICD-10-CM | POA: Diagnosis not present

## 2022-09-22 DIAGNOSIS — E1142 Type 2 diabetes mellitus with diabetic polyneuropathy: Secondary | ICD-10-CM | POA: Diagnosis not present

## 2022-09-22 DIAGNOSIS — M545 Low back pain, unspecified: Secondary | ICD-10-CM | POA: Diagnosis not present

## 2022-09-22 DIAGNOSIS — Z79891 Long term (current) use of opiate analgesic: Secondary | ICD-10-CM | POA: Diagnosis not present

## 2022-09-22 DIAGNOSIS — G894 Chronic pain syndrome: Secondary | ICD-10-CM | POA: Diagnosis not present

## 2022-09-22 DIAGNOSIS — M542 Cervicalgia: Secondary | ICD-10-CM | POA: Diagnosis not present

## 2022-09-22 DIAGNOSIS — R202 Paresthesia of skin: Secondary | ICD-10-CM | POA: Diagnosis not present

## 2022-09-22 DIAGNOSIS — M5136 Other intervertebral disc degeneration, lumbar region: Secondary | ICD-10-CM | POA: Diagnosis not present

## 2022-09-22 DIAGNOSIS — M5431 Sciatica, right side: Secondary | ICD-10-CM | POA: Diagnosis not present

## 2022-09-22 DIAGNOSIS — G8929 Other chronic pain: Secondary | ICD-10-CM | POA: Diagnosis not present

## 2022-09-22 DIAGNOSIS — M5432 Sciatica, left side: Secondary | ICD-10-CM | POA: Diagnosis not present

## 2022-09-23 ENCOUNTER — Other Ambulatory Visit: Payer: Self-pay | Admitting: "Endocrinology

## 2022-09-27 ENCOUNTER — Other Ambulatory Visit: Payer: Self-pay

## 2022-09-27 DIAGNOSIS — E119 Type 2 diabetes mellitus without complications: Secondary | ICD-10-CM

## 2022-09-27 MED ORDER — VICTOZA 18 MG/3ML ~~LOC~~ SOPN: PEN_INJECTOR | SUBCUTANEOUS | 0 refills | Status: DC

## 2022-09-27 MED ORDER — METFORMIN HCL 500 MG PO TABS: 500.0000 mg | ORAL_TABLET | Freq: Two times a day (BID) | ORAL | 2 refills | Status: DC

## 2022-10-06 ENCOUNTER — Ambulatory Visit: Payer: Medicare Other | Admitting: Internal Medicine

## 2022-10-09 ENCOUNTER — Other Ambulatory Visit: Payer: Self-pay | Admitting: Family Medicine

## 2022-10-09 DIAGNOSIS — K219 Gastro-esophageal reflux disease without esophagitis: Secondary | ICD-10-CM

## 2022-10-15 ENCOUNTER — Other Ambulatory Visit: Payer: Self-pay | Admitting: "Endocrinology

## 2022-10-16 DIAGNOSIS — I509 Heart failure, unspecified: Secondary | ICD-10-CM | POA: Diagnosis not present

## 2022-10-16 DIAGNOSIS — J449 Chronic obstructive pulmonary disease, unspecified: Secondary | ICD-10-CM | POA: Diagnosis not present

## 2022-10-20 DIAGNOSIS — Z79891 Long term (current) use of opiate analgesic: Secondary | ICD-10-CM | POA: Diagnosis not present

## 2022-10-20 DIAGNOSIS — G894 Chronic pain syndrome: Secondary | ICD-10-CM | POA: Diagnosis not present

## 2022-10-24 ENCOUNTER — Other Ambulatory Visit: Payer: Self-pay | Admitting: Internal Medicine

## 2022-10-24 ENCOUNTER — Other Ambulatory Visit: Payer: Self-pay | Admitting: Cardiology

## 2022-10-24 DIAGNOSIS — E7849 Other hyperlipidemia: Secondary | ICD-10-CM

## 2022-11-09 DIAGNOSIS — I5032 Chronic diastolic (congestive) heart failure: Secondary | ICD-10-CM | POA: Diagnosis not present

## 2022-11-09 DIAGNOSIS — N189 Chronic kidney disease, unspecified: Secondary | ICD-10-CM | POA: Diagnosis not present

## 2022-11-09 DIAGNOSIS — R808 Other proteinuria: Secondary | ICD-10-CM | POA: Diagnosis not present

## 2022-11-09 DIAGNOSIS — Z794 Long term (current) use of insulin: Secondary | ICD-10-CM | POA: Diagnosis not present

## 2022-11-09 DIAGNOSIS — E559 Vitamin D deficiency, unspecified: Secondary | ICD-10-CM | POA: Diagnosis not present

## 2022-11-09 DIAGNOSIS — E1122 Type 2 diabetes mellitus with diabetic chronic kidney disease: Secondary | ICD-10-CM | POA: Diagnosis not present

## 2022-11-09 DIAGNOSIS — E119 Type 2 diabetes mellitus without complications: Secondary | ICD-10-CM | POA: Diagnosis not present

## 2022-11-10 ENCOUNTER — Other Ambulatory Visit: Payer: Self-pay | Admitting: "Endocrinology

## 2022-11-10 LAB — COMPREHENSIVE METABOLIC PANEL
ALT: 14 IU/L (ref 0–32)
AST: 15 IU/L (ref 0–40)
Albumin/Globulin Ratio: 1.4 (ref 1.2–2.2)
Albumin: 4 g/dL (ref 3.8–4.9)
Alkaline Phosphatase: 118 IU/L (ref 44–121)
BUN/Creatinine Ratio: 13 (ref 9–23)
BUN: 16 mg/dL (ref 6–24)
Bilirubin Total: 0.3 mg/dL (ref 0.0–1.2)
CO2: 24 mmol/L (ref 20–29)
Calcium: 10 mg/dL (ref 8.7–10.2)
Chloride: 101 mmol/L (ref 96–106)
Creatinine, Ser: 1.25 mg/dL — ABNORMAL HIGH (ref 0.57–1.00)
Globulin, Total: 2.8 g/dL (ref 1.5–4.5)
Glucose: 149 mg/dL — ABNORMAL HIGH (ref 70–99)
Potassium: 4.3 mmol/L (ref 3.5–5.2)
Sodium: 139 mmol/L (ref 134–144)
Total Protein: 6.8 g/dL (ref 6.0–8.5)
eGFR: 51 mL/min/{1.73_m2} — ABNORMAL LOW (ref >59)

## 2022-11-10 LAB — LIPID PANEL
Chol/HDL Ratio: 4.8 ratio — ABNORMAL HIGH (ref 0.0–4.4)
Cholesterol, Total: 193 mg/dL (ref 100–199)
HDL: 40 mg/dL (ref >39)
LDL Chol Calc (NIH): 108 mg/dL — ABNORMAL HIGH (ref 0–99)
Triglycerides: 260 mg/dL — ABNORMAL HIGH (ref 0–149)
VLDL Cholesterol Cal: 45 mg/dL — ABNORMAL HIGH (ref 5–40)

## 2022-11-13 ENCOUNTER — Encounter: Payer: Self-pay | Admitting: "Endocrinology

## 2022-11-13 ENCOUNTER — Ambulatory Visit (INDEPENDENT_AMBULATORY_CARE_PROVIDER_SITE_OTHER): Payer: 59 | Admitting: "Endocrinology

## 2022-11-13 VITALS — BP 126/70 | HR 80 | Ht 60.0 in | Wt 202.0 lb

## 2022-11-13 DIAGNOSIS — E782 Mixed hyperlipidemia: Secondary | ICD-10-CM | POA: Diagnosis not present

## 2022-11-13 DIAGNOSIS — E119 Type 2 diabetes mellitus without complications: Secondary | ICD-10-CM

## 2022-11-13 DIAGNOSIS — I10 Essential (primary) hypertension: Secondary | ICD-10-CM

## 2022-11-13 DIAGNOSIS — Z794 Long term (current) use of insulin: Secondary | ICD-10-CM | POA: Diagnosis not present

## 2022-11-13 DIAGNOSIS — Z6839 Body mass index (BMI) 39.0-39.9, adult: Secondary | ICD-10-CM

## 2022-11-13 LAB — POCT GLYCOSYLATED HEMOGLOBIN (HGB A1C): HbA1c, POC (controlled diabetic range): 7.8 % — AB (ref 0.0–7.0)

## 2022-11-13 NOTE — Patient Instructions (Signed)

## 2022-11-13 NOTE — Progress Notes (Signed)
11/13/2022, 6:19 PM  Endocrinology follow-up note   Subjective:    Patient ID: Christine Weaver, female    DOB: 1965-11-26.  Christine Weaver is being seen in follow-up after she was seen in consultation for management of currently uncontrolled symptomatic diabetes requested by  Lindell Spar, MD.   Past Medical History:  Diagnosis Date   Asthma    Bell's palsy    COPD (chronic obstructive pulmonary disease) ([place])    Hyperlipidemia    Hypertension    Lyme disease    Type 2 diabetes mellitus ([place])     Past Surgical History:  Procedure Laterality Date   CESAREAN SECTION     COLONOSCOPY WITH PROPOFOL N/A 12/16/2021   Procedure: COLONOSCOPY WITH PROPOFOL;  Surgeon: Harvel Quale, MD;  [facility];  Service: Gastroenterology;  Laterality: N/A;  [place]  12/16/2021   Procedure: HOT HEMOSTASIS (ARGON PLASMA COAGULATION/BICAP);  Surgeon: Montez Morita, Quillian Quince, MD;  Location: [facility];  Service: Gastroenterology;;   POLYPECTOMY  12/16/2021   Procedure: POLYPECTOMY;  Surgeon: Harvel Quale, MD;  Location: [facility];  Service: Gastroenterology;;   RENAL BIOPSY  06/02/2022   SUBMUCOSAL TATTOO INJECTION  12/16/2021   Procedure: SUBMUCOSAL TATTOO INJECTION;  Surgeon: Harvel Quale, MD;  Location: [facility];  Service: Gastroenterology;;    Social History   Socioeconomic History   Marital status: Widowed    Spouse name: Not on file   Number of children: Not on file   Years of education: Not on file   Highest education level: Not on file  Occupational History   Not on file  Tobacco Use   Smoking status: Former    Types: Cigarettes    Quit date: 10/29/2020    Years since quitting: 2.0   Smokeless tobacco: Never   Tobacco comments:    Uses nicotine patches. Has not smoked since new years eve 2021-  06/30/21  Vaping Use   Vaping Use: Never used  Substance and Sexual Activity    Alcohol use: Never   Drug use: Never   Sexual activity: Not on file  Other Topics Concern   Not on file  Social History Narrative   Not on file   Social Determinants of Health   Financial Resource Strain: Low Risk  (07/17/2022)   Overall Financial Resource Strain (CARDIA)    Difficulty of Paying Living Expenses: Not hard at all  Food Insecurity: No Food Insecurity (07/17/2022)   Hunger Vital Sign    Worried About Running Out of Food in the Last Year: Never true    Ran Out of Food in the Last Year: Never true  Transportation Needs: No Transportation Needs (07/17/2022)   PRAPARE - Hydrologist (Medical): No    Lack of Transportation (Non-Medical): No  Physical Activity: Sufficiently Active (07/17/2022)   Exercise Vital Sign    Days of Exercise per Week: 7 days    Minutes of Exercise per Session: 30 min  Stress: No Stress Concern Present (07/17/2022)   [place]    Feeling of Stress : Only a little  Social Connections: Moderately Isolated (07/17/2022)   Social Connection and Isolation Panel [NHANES]    Frequency of Communication with Friends and Family: More than three times a week    Frequency of Social Gatherings with Friends and Family: More than three times a week  Attends Religious Services: 1 to 4 times per year    Active Member of Clubs or Organizations: No    Attends Archivist Meetings: Never    Marital Status: Widowed    Family History  Problem Relation Age of Onset   Thyroid disease Mother    Hypertension Mother    Heart attack Mother    Stroke Mother    Heart failure Mother    Diabetes Mother    Emphysema Mother    Cancer Mother    Diabetes Father    Heart attack Father    Stroke Father    Kidney disease Father    Heart failure Father     Outpatient Encounter Medications as of 11/13/2022  Medication Sig   albuterol (VENTOLIN HFA) 108 (90 Base) MCG/ACT  inhaler Inhale 2 puffs into the lungs every 4 (four) hours as needed for wheezing or shortness of breath.   amLODipine (NORVASC) 10 MG tablet TAKE 1 TABLET BY MOUTH EVERY DAY   ANORO ELLIPTA 62.5-25 MCG/ACT AEPB INHALE 1 PUFF BY MOUTH EVERY DAY   atorvastatin (LIPITOR) 80 MG tablet TAKE 1 TABLET BY MOUTH EVERY DAY   BD PEN NEEDLE NANO 2ND GEN 32G X 4 MM MISC USE 1 PEN NEEDLE FOR 6 INJECTIONS DAILY FOR DIABETES E11.9   budesonide-formoterol (SYMBICORT) 80-4.5 MCG/ACT inhaler TAKE 2 PUFFS FIRST THING IN AM AND THEN ANOTHER 2 PUFFS ABOUT 12 HOURS LATER   Continuous Blood Gluc Receiver (FREESTYLE LIBRE 2 READER) DEVI As directed   diclofenac Sodium (VOLTAREN) 1 % GEL Apply 1 Application topically 4 (four) times daily as needed (pain).   famotidine (PEPCID) 20 MG tablet TAKE 1 TABLET AFTER SUPPER   FARXIGA 5 MG TABS tablet Take 5 mg by mouth every other day.   Ferrous Sulfate (IRON PO) Take 1 tablet by mouth daily.   fluticasone (FLONASE) 50 MCG/ACT nasal spray Place 1 spray into both nostrils daily as needed for allergies.   furosemide (LASIX) 20 MG tablet TAKE 1 TABLET BY MOUTH TWICE A DAY   glucose blood (ACCU-CHEK AVIVA PLUS) test strip    HYDROcodone-acetaminophen (NORCO/VICODIN) 5-325 MG tablet Take 1 tablet by mouth every 12 (twelve) hours as needed for severe pain.   insulin lispro (HUMALOG KWIKPEN) 100 UNIT/ML KwikPen Inject 5-11 Units into the skin 3 (three) times daily.   irbesartan (AVAPRO) 75 MG tablet Take 1 tablet (75 mg total) by mouth daily.   liraglutide (VICTOZA) 18 MG/3ML SOPN INJECT 1.8 MG UNDER THE SKIN ONCE DAILY   lisinopril (ZESTRIL) 30 MG tablet TAKE 1 TABLET BY MOUTH EVERY DAY   metFORMIN (GLUCOPHAGE) 500 MG tablet Take 1 tablet (500 mg total) by mouth 2 (two) times daily with a meal.   metFORMIN (GLUCOPHAGE) 500 MG tablet Take 1 tablet (500 mg total) by mouth 2 (two) times daily with a meal.   nystatin cream (MYCOSTATIN) Apply 1 application  topically 2 (two) times daily  as needed (rash).   omeprazole (PRILOSEC) 40 MG capsule TAKE 1 CAPSULE (40 MG TOTAL) BY MOUTH DAILY.   OVER THE COUNTER MEDICATION Calcium one daily   OVER THE COUNTER MEDICATION Iron 65 mg one bid   VITAMIN D PO Take 1 capsule by mouth daily.   [DISCONTINUED] insulin glargine (LANTUS SOLOSTAR) 100 UNIT/ML Solostar Pen Inject 70 Units into the skin at bedtime.   No facility-administered encounter medications on file as of 11/13/2022.    ALLERGIES: Allergies  Allergen Reactions   Morphine And Related Itching  Other Other (See Comments)    Bells Palsy    Oxycodone-Acetaminophen Other (See Comments)    Constipates patient    Latex Itching and Other (See Comments)    Skin "cracks" open     VACCINATION STATUS: Immunization History  Administered Date(s) Administered   Moderna SARS-COV2 Booster Vaccination 09/03/2021   Moderna Sars-Covid-2 Vaccination 12/11/2019, 11/13/2020   PNEUMOCOCCAL CONJUGATE-20 10/06/2021   Tdap 03/13/2018   Zoster Recombinat (Shingrix) 10/06/2021, 02/06/2022    Diabetes She presents for her follow-up diabetic visit. She has type 2 diabetes mellitus. Onset time: She was diagnosed at approximately age of 18 years. Her disease course has been stable. There are no hypoglycemic associated symptoms. Pertinent negatives for hypoglycemia include no confusion, headaches, pallor or seizures. Pertinent negatives for diabetes include no chest pain, no fatigue, no polydipsia, no polyphagia and no polyuria. There are no hypoglycemic complications. Symptoms are stable. Risk factors for coronary artery disease include dyslipidemia, diabetes mellitus, hypertension, obesity, sedentary lifestyle and tobacco exposure. Current diabetic treatment includes insulin injections. Her weight is fluctuating minimally. She is following a generally unhealthy diet. When asked about meal planning, she reported none. She has not had a previous visit with a dietitian. She never participates in  exercise. Her home blood glucose trend is decreasing steadily. Her breakfast blood glucose range is generally 130-140 mg/dl. Her lunch blood glucose range is generally 130-140 mg/dl. Her dinner blood glucose range is generally 130-140 mg/dl. Her bedtime blood glucose range is generally 130-140 mg/dl. Her overall blood glucose range is 130-140 mg/dl. (Christine Weaver presents with controlled glycemic profile.  Her point-of-care A1c is 7.8%, AGP report shows 66% time range, 29% level 1 hyperglycemia, 5% level 2 hyperglycemia.  She did not document any hypoglycemia.   ) An ACE inhibitor/angiotensin II receptor blocker is being taken. Eye exam is current.  Hyperlipidemia This is a chronic problem. The problem is uncontrolled. Recent lipid tests were reviewed and are high. Exacerbating diseases include diabetes and obesity. Pertinent negatives include no chest pain, myalgias or shortness of breath. Current antihyperlipidemic treatment includes statins. Risk factors for coronary artery disease include dyslipidemia, diabetes mellitus, family history, hypertension, obesity and a sedentary lifestyle.  Hypertension This is a chronic problem. The current episode started more than 1 year ago. The problem is uncontrolled. Pertinent negatives include no chest pain, headaches, palpitations or shortness of breath. Risk factors for coronary artery disease include diabetes mellitus, dyslipidemia, obesity, sedentary lifestyle, smoking/tobacco exposure and family history. Past treatments include ACE inhibitors.   Review of systems: Limited as above.    Objective:       11/13/2022   11:16 AM 07/11/2022    3:25 PM 07/06/2022   12:31 PM  Vitals with BMI  Height '5\' 0"'$  '5\' 0"'$  '5\' 0"'$   Weight 202 lbs 204 lbs 3 oz 203 lbs 8 oz  BMI 39.45 98.92 11.94  Systolic 174 081 448  Diastolic 70 76 81  Pulse 80 88 89    BP 126/70   Pulse 80   Ht 5' (1.524 m)   Wt 202 lb (91.6 kg)   BMI 39.45 kg/m   Wt Readings from Last 3 Encounters:   11/13/22 202 lb (91.6 kg)  07/11/22 204 lb 3.2 oz (92.6 kg)  07/06/22 203 lb 8 oz (92.3 kg)     CMP ( most recent) CMP     Component Value Date/Time   NA 139 11/09/2022 1013   K 4.3 11/09/2022 1013   CL 101 11/09/2022 1013  CO2 24 11/09/2022 1013   GLUCOSE 149 (H) 11/09/2022 1013   GLUCOSE 184 (H) 12/14/2021 0901   BUN 16 11/09/2022 1013   CREATININE 1.25 (H) 11/09/2022 1013   CALCIUM 10.0 11/09/2022 1013   PROT 6.8 11/09/2022 1013   ALBUMIN 4.0 11/09/2022 1013   AST 15 11/09/2022 1013   ALT 14 11/09/2022 1013   ALKPHOS 118 11/09/2022 1013   BILITOT 0.3 11/09/2022 1013   GFRNONAA >60 12/14/2021 0901   GFRAA 80 11/11/2020 0817    Diabetic Labs (most recent): Lab Results  Component Value Date   HGBA1C 7.8 (A) 11/13/2022   HGBA1C 6.7 07/11/2022   HGBA1C 7.4 (A) 03/07/2022     Lipid Panel ( most recent) Lipid Panel     Component Value Date/Time   CHOL 193 11/09/2022 1013   TRIG 260 (H) 11/09/2022 1013   HDL 40 11/09/2022 1013   CHOLHDL 4.8 (H) 11/09/2022 1013   LDLCALC 108 (H) 11/09/2022 1013   LABVLDL 45 (H) 11/09/2022 1013      Lab Results  Component Value Date   TSH 3.110 04/06/2022   TSH 1.210 10/13/2021   TSH 1.730 01/27/2021   TSH 2.640 11/11/2020   TSH 1.42 09/23/2019   FREET4 1.25 04/06/2022   FREET4 1.25 01/27/2021   FREET4 1.28 11/11/2020      Assessment & Plan:   1. Type 2 diabetes mellitus with hyperglycemia, with long-term current use of insulin (HCC)  - Christine Weaver has currently uncontrolled symptomatic type 2 DM since  57 years of age.  Christine Weaver presents with controlled glycemic profile.  Her point-of-care A1c is 7.8%, AGP report shows 66% time range, 29% level 1 hyperglycemia, 5% level 2 hyperglycemia.  She did not document any hypoglycemia.     Recent labs reviewed.  - I had a long discussion with her about the progressive nature of diabetes and the pathology behind its complications. -her diabetes is complicated by  obesity/sedentary life, smoking and she remains at a high risk for more acute and chronic complications which include CAD, CVA, CKD, retinopathy, and neuropathy. These are all discussed in detail with her.  -  she is advised to stick to a routine mealtimes to eat 3 meals  a day and avoid unnecessary snacks ( to snack only to correct hypoglycemia).  - she acknowledges that there is a room for improvement in her food and drink choices. - Suggestion is made for her to avoid simple carbohydrates  from her diet including Cakes, Sweet Desserts, Ice Cream, Soda (diet and regular), Sweet Tea, Candies, Chips, Cookies, Store Bought Juices, Alcohol in Excess of  1-2 drinks a day, Artificial Sweeteners,  Coffee Creamer, and "Sugar-free" Products, Lemonade. This will help patient to have more stable blood glucose profile and potentially avoid unintended weight gain.  - she will be scheduled with Jearld Fenton, RDN, CDE for diabetes education.  - I have approached her with the following individualized plan to manage  her diabetes and patient agrees:    She will continue to need intensive treatment with basal/bolus insulin in order for her to maintain control of diabetes to target.    She is advised to continue Lantus 70 units nightly, continue Humalog   5 units  3 times a day with meals  for pre-meal BG readings of 90-'150mg'$ /dl, plus patient specific correction dose for unexpected hyperglycemia above '150mg'$ /dl, associated with strict monitoring of glucose 4 times a day-before meals and at bedtime. -He is advised to wear her CGM at  all times.  - she is warned not to take insulin without proper monitoring per orders. - Adjustment parameters are given to her for hypo and hyperglycemia in writing. - she is encouraged to call clinic for blood glucose levels less than 70 or above 200 mg /dl. - she is advised to continue metformin 500 mg p.o. twice daily.  she is advised to continue Farxiga 5 mg p.o. daily at breakfast  started by her nephrologist.  She is also advised to continue Victoza 1.8 mg subcutaneously daily.    - Specific targets for  A1c;  LDL, HDL,  and Triglycerides were discussed with the patient.  2) Blood Pressure /Hypertension:   Her blood pressure is controlled to target.   she is advised to continue her current medications including lisinopril 20 mg p.o. daily with breakfast .  3) Lipids/Hyperlipidemia:   Review of her recent lipid panel showed un controlled  LDL at 121.  She is advised to continue atorvastatin 80 mg p.o. nightly.  Whole food plant-based diet was discussed and recommended to her.   Side effects and precautions discussed with her.  4)  Weight/Diet: Her BMI is 39.45- -  clearly complicating her diabetes care.   she is  a candidate for weight loss. I discussed with her the fact that loss of 5 - 10% of her  current body weight will have the most impact on her diabetes management.  Exercise, and detailed carbohydrates information provided  -  detailed on discharge instructions.  5) Chronic Care/Health Maintenance:  -she  is on ACEI/ARB and Statin medications and  is encouraged to initiate and continue to follow up with Ophthalmology, Dentist,  Podiatrist at least yearly or according to recommendations, and advised to  quit smoking. I have recommended yearly flu vaccine and pneumonia vaccine at least every 5 years; moderate intensity exercise for up to 150 minutes weekly; and  sleep for at least 7 hours a day.  Her recent screening ABI was negative for PAD in February 2022.  Her next study will be due in February 2027 or sooner if needed.   - she is  advised to maintain close follow up with Lindell Spar, MD for primary care needs, as well as her other providers for optimal and coordinated care.  I spent 26 minutes in the care of the patient today including review of labs from [facility], Lipids, Thyroid Function, Hematology (current and previous including abstractions from other  facilities); face-to-face time discussing  her blood glucose readings/logs, discussing hypoglycemia and hyperglycemia episodes and symptoms, medications doses, her options of short and long term treatment based on the latest standards of care / guidelines;  discussion about incorporating lifestyle medicine;  and documenting the encounter. Risk reduction counseling performed per USPSTF guidelines to reduce  obesity and cardiovascular risk factors.     Please refer to Patient Instructions for Blood Glucose Monitoring and Insulin/Medications Dosing Guide"  in media tab for additional information. Please  also refer to " Patient Self Inventory" in the Media  tab for reviewed elements of pertinent patient history.  Christine Weaver participated in the discussions, expressed understanding, and voiced agreement with the above plans.  All questions were answered to her satisfaction. she is encouraged to contact clinic should she have any questions or concerns prior to her return visit.     Follow up plan: - Return in about 4 months (around 03/14/2023) for Bring Meter/CGM Device/Logs- A1c in Office.  Glade Lloyd, MD [place]  [facility] [address] [place], [place] [address] Phone: [phone]  Fax: [phone]    11/13/2022, 6:19 PM  This note was partially dictated with voice recognition software. Similar sounding words can be transcribed inadequately or may not  be corrected upon review.

## 2022-11-15 ENCOUNTER — Other Ambulatory Visit: Payer: Self-pay | Admitting: Internal Medicine

## 2022-11-15 ENCOUNTER — Other Ambulatory Visit: Payer: Self-pay | Admitting: Cardiology

## 2022-11-15 ENCOUNTER — Other Ambulatory Visit: Payer: Self-pay | Admitting: "Endocrinology

## 2022-11-15 DIAGNOSIS — E119 Type 2 diabetes mellitus without complications: Secondary | ICD-10-CM

## 2022-11-15 DIAGNOSIS — G8929 Other chronic pain: Secondary | ICD-10-CM | POA: Diagnosis not present

## 2022-11-15 DIAGNOSIS — I10 Essential (primary) hypertension: Secondary | ICD-10-CM

## 2022-11-15 DIAGNOSIS — M5136 Other intervertebral disc degeneration, lumbar region: Secondary | ICD-10-CM | POA: Diagnosis not present

## 2022-11-15 DIAGNOSIS — M542 Cervicalgia: Secondary | ICD-10-CM | POA: Diagnosis not present

## 2022-11-15 DIAGNOSIS — R202 Paresthesia of skin: Secondary | ICD-10-CM | POA: Diagnosis not present

## 2022-11-15 DIAGNOSIS — M545 Low back pain, unspecified: Secondary | ICD-10-CM | POA: Diagnosis not present

## 2022-11-15 DIAGNOSIS — Z79891 Long term (current) use of opiate analgesic: Secondary | ICD-10-CM | POA: Diagnosis not present

## 2022-11-15 DIAGNOSIS — G894 Chronic pain syndrome: Secondary | ICD-10-CM | POA: Diagnosis not present

## 2022-11-15 DIAGNOSIS — M5432 Sciatica, left side: Secondary | ICD-10-CM | POA: Diagnosis not present

## 2022-11-15 DIAGNOSIS — E1142 Type 2 diabetes mellitus with diabetic polyneuropathy: Secondary | ICD-10-CM | POA: Diagnosis not present

## 2022-11-15 DIAGNOSIS — M5431 Sciatica, right side: Secondary | ICD-10-CM | POA: Diagnosis not present

## 2022-11-15 NOTE — Progress Notes (Signed)
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  Quit work 2004 at 6   After moving to [facility] 2012 saw [employer] doctor feeling sluggish and dx with asthma > meds didn't help much then started doe which seemed better with alb neb then added advair around 2014/15 then 02     History of Present Illness  06/30/2021  Pulmonary/ 1st Weaver eval/ Christine Weaver / Christine Weaver  Chief Complaint  Patient presents with   Follow-up    Pt. Is on 2L O2 when sleeping and out running errands but tries not to wear it in the house unless she needs it. Coughing but not normally coughing up mucus.   Dyspnea:  foodlion 50% using 02 2lpm/  Cough: always coughing some very hoarse x years >minimally productive  Sleep: 45 degrees with pillows  SABA use: no longer needing since 02  02 :  2lpm at bedtime and prn daytime  Intol of dpi  Rec Omeprazole 40 mg Take  30-60 min before first meal of the day and Pepcid (famotidine)  20 mg after supper until return to Weaver - this is the best way to tell whether stomach acid is contributing to your problem.   GERD rx Stop advair and start symbicort 80 Take 2 puffs first thing in am and then another 2 puffs about 12 hours later.  Work on inhaler technique: Marland Kitchen  Make sure you check your oxygen saturation  at your highest level of activity   Please schedule a follow up Weaver visit in 6-8  weeks, call sooner if needed with pfts on return   08/30/2021  f/u ov/Ridgeside Weaver/Christine Weaver re:  maint on symbicort 80 2bid     Chief Complaint  Patient presents with   Follow-up    2 lpm all of the time. Persistent dry cough. Feels inhaler is helping.   Dyspnea:   push mower x 20 min s 02  says after stopped 89% but not checking  while exerting  Cough: dry  Sleeping: flat bed 45  degrees SABA use: minimal  02: 2lpm hs and prn  Covid status: vax 3  Lung cancer screening: referred  Rec We will have our lung screening nurse practioner call you to set  up follow up CT not due til Feb 2023 Make sure you check your oxygen saturation  at your highest level of activity  to be sure it stays over 90% Keep your appt for your PFTs  Please schedule a follow up visit in 6  months but call sooner if needed  Late add not on amlodipine or lisinopril so started avapro 75 mg daily     03/06/2022  f/u ov/Christine Weaver/Christine Weaver re: COPD/AB maint on symbicort 80 2bid   Chief Complaint  Patient presents with   Follow-up    Since allergy season has started patient has been having to use portable O2 while out   Dyspnea:  says can walk the whole store@ [employer] or [employer] / mall walking slower than others = MMRC2 = can't walk a nl pace on a flat grade s sob but does fine slow and flat eg  Cough: min mucoid at hs but not does not keep her up once asleep Sleeping: 45 degrees with pillows / sleeps with cat  SABA use: up to 3 x daily  due to "allergy season" 02: 2lpm NP when  "cat wakes her up because 02 drops"  Covid status: x 3  Lung cancer screening: due  Rec I will be referring you to ENT for chronic hoarseness  We will be calling to arrange for you to start lung cancer screening  We will be scheduling PFTs  in [place] either when you go for ENT or pain management  Work on inhaler technique:   Please schedule a follow up visit in 6 months but call sooner if needed    11/16/2022  f/u ov/Binford Weaver/Christine Weaver re: COPD/AB maint on symbicort 80  Dyspnea:  working out at [employer]  Cough: very hoarse Sleeping: bed is flat / 2 pillows  SABA use: none  02: 2lpm and prn       No obvious day to day or daytime variability or assoc excess/ purulent sputum or mucus plugs or hemoptysis or cp or chest tightness, subjective wheeze or overt sinus or hb symptoms.   sleeping without  nocturnal  or early am exacerbation  of respiratory  c/o's or need for noct saba. Also denies any obvious fluctuation of symptoms with weather or environmental changes or other aggravating or alleviating factors except as outlined above   No unusual exposure hx or h/o childhood pna/ asthma or knowledge of premature birth.  Current Allergies, Complete Past Medical History, Past Surgical History, Family History, and Social History were reviewed in [employer] record.  ROS  The following are not active complaints unless bolded Hoarseness, sore throat, dysphagia, dental problems, itching, sneezing,  nasal congestion or discharge of excess mucus or purulent secretions, ear ache,   fever, chills, sweats, unintended wt loss or wt gain, classically pleuritic or exertional cp,  orthopnea pnd or arm/hand swelling  or leg swelling, presyncope, palpitations, abdominal pain, anorexia, nausea, vomiting, diarrhea  or change in bowel habits or change in bladder habits, change in stools or change in urine, dysuria, hematuria,  rash, arthralgias, visual complaints, headache, numbness, weakness or ataxia or problems with walking or coordination,  change in mood or  memory.        Current Meds  Medication Sig   albuterol (VENTOLIN HFA) 108 (90 Base) MCG/ACT inhaler Inhale 2 puffs into the lungs every 4 (four) hours as needed for wheezing or shortness of breath.   amLODipine (NORVASC) 10 MG tablet TAKE 1 TABLET BY MOUTH EVERY DAY   atorvastatin (LIPITOR) 80 MG tablet TAKE 1 TABLET BY MOUTH EVERY DAY   BD PEN NEEDLE NANO 2ND GEN 32G X 4 MM MISC USE 1 PEN NEEDLE FOR 6 INJECTIONS DAILY FOR DIABETES E11.9   budesonide-formoterol (SYMBICORT) 80-4.5 MCG/ACT inhaler TAKE 2 PUFFS FIRST THING IN AM AND THEN ANOTHER 2 PUFFS ABOUT 12 HOURS LATER   Continuous Blood Gluc Receiver (FREESTYLE LIBRE 2 READER) DEVI As directed   diclofenac Sodium (VOLTAREN) 1 % GEL Apply 1 Application topically 4 (four) times  daily as needed (pain).   famotidine (PEPCID) 20 MG tablet TAKE 1 TABLET AFTER SUPPER   FARXIGA 5 MG TABS tablet Take 5 mg by mouth every other day.   Ferrous Sulfate (IRON PO) Take 1 tablet by mouth daily.   fluticasone (FLONASE) 50 MCG/ACT nasal spray Place 1 spray into both nostrils daily as needed for allergies.   furosemide (LASIX) 20 MG tablet TAKE 1 TABLET BY MOUTH TWICE A DAY   glucose blood (ACCU-CHEK AVIVA PLUS) test strip  HYDROcodone-acetaminophen (NORCO/VICODIN) 5-325 MG tablet Take 1 tablet by mouth every 12 (twelve) hours as needed for severe pain.   Insulin Glargine (BASAGLAR KWIKPEN) 100 UNIT/ML Inject 70 Units into the skin at bedtime.   insulin lispro (HUMALOG KWIKPEN) 100 UNIT/ML KwikPen Inject 5-11 Units into the skin 3 (three) times daily.   irbesartan (AVAPRO) 75 MG tablet TAKE 1 TABLET BY MOUTH EVERY DAY   liraglutide (VICTOZA) 18 MG/3ML SOPN INJECT 1.8 MG UNDER THE SKIN ONCE DAILY   metFORMIN (GLUCOPHAGE) 500 MG tablet Take 1 tablet (500 mg total) by mouth 2 (two) times daily with a meal.   metFORMIN (GLUCOPHAGE) 500 MG tablet Take 1 tablet (500 mg total) by mouth 2 (two) times daily with a meal.   nystatin cream (MYCOSTATIN) Apply 1 application  topically 2 (two) times daily as needed (rash).   omeprazole (PRILOSEC) 40 MG capsule TAKE 1 CAPSULE (40 MG TOTAL) BY MOUTH DAILY.   OVER THE COUNTER MEDICATION Calcium one daily   OVER THE COUNTER MEDICATION Iron 65 mg one bid   VITAMIN D PO Take 1 capsule by mouth daily.   [DISCONTINUED] ANORO ELLIPTA 62.5-25 MCG/ACT AEPB INHALE 1 PUFF BY MOUTH EVERY DAY   [DISCONTINUED] lisinopril (ZESTRIL) 30 MG tablet TAKE 1 TABLET BY MOUTH EVERY DAY              Past Medical History:  Diagnosis Date   Asthma    COPD (chronic obstructive pulmonary disease) (HCC)    Hyperlipidemia    Hypertension    Type 2 diabetes mellitus (HCC)         Objective:    11/16/2022       200  03/06/2022         206   08/30/21 212 lb (96.2  kg)  07/11/21 209 lb 6.4 oz (95 kg)  06/30/21 210 lb 1.6 oz (95.3 kg)    Vital signs reviewed  11/16/2022  - Note at rest 02 sats  95% on RA   General appearance:    amb hoarse wf / harsh cough    HEENT : Oropharynx  clear/ edentulous      NECK :  without  apparent JVD/ palpable Nodes/TM    LUNGS: no acc muscle use,  Min barrel  contour chest wall with bilateral  slightly decreased bs s audible wheeze and  without cough on insp or exp maneuvers and min  Hyperresonant  to  percussion bilaterally    CV:  RRR  no s3 or murmur or increase in P2, and no edema   ABD:  obese soft and nontender with pos end  insp Hoover's  in the supine position.  No bruits or organomegaly appreciated   MS:  Nl gait/ ext warm without deformities Or obvious joint restrictions  calf tenderness, cyanosis or clubbing     SKIN: warm and dry without lesions    NEURO:  alert, approp, nl sensorium with  no motor or cerebellar deficits apparent.                I personally reviewed images and agree with radiology impression as follows:   Chest LDSCT   04/25/22  1. Lung-RADS 2, benign appearance or behavior. Continue annual screening with low-dose chest CT without contrast in 12 months. 2. Coronary artery calcification. 3. Enlarged pulmonic trunk, indicative of pulmonary arterial hypertension. 4.  Emphysema (ICD10-J43.9).      Assessment

## 2022-11-16 ENCOUNTER — Encounter: Payer: Self-pay | Admitting: Internal Medicine

## 2022-11-16 ENCOUNTER — Ambulatory Visit (INDEPENDENT_AMBULATORY_CARE_PROVIDER_SITE_OTHER): Payer: 59 | Admitting: Internal Medicine

## 2022-11-16 VITALS — BP 138/92 | HR 98 | Temp 98.2°F | Ht 60.0 in | Wt 200.0 lb

## 2022-11-16 DIAGNOSIS — I10 Essential (primary) hypertension: Secondary | ICD-10-CM | POA: Diagnosis not present

## 2022-11-16 DIAGNOSIS — J449 Chronic obstructive pulmonary disease, unspecified: Secondary | ICD-10-CM | POA: Diagnosis not present

## 2022-11-16 DIAGNOSIS — I509 Heart failure, unspecified: Secondary | ICD-10-CM | POA: Diagnosis not present

## 2022-11-16 DIAGNOSIS — R490 Dysphonia: Secondary | ICD-10-CM

## 2022-11-16 NOTE — Assessment & Plan Note (Addendum)
There is mild left ventricular hypertrophy.  Left ventricular diastolic parameters  are indeterminate. Elevated left atrial pressure. -D/c acei 06/30/2021 due to hoarseness /cough   Bp is marginally controlled and seeing both renal/PCP so not recommending any changes from this office but would avoid ACEi in future due to cough/ hoarseness.         Each maintenance medication was reviewed in detail including emphasizing most importantly the difference between maintenance and prns and under what circumstances the prns are to be triggered using an action plan format where appropriate.  Total time for H and P, chart review, counseling, reviewing hfa  device(s) and generating customized AVS unique to this office visit / same day charting = 30 min

## 2022-11-16 NOTE — Patient Instructions (Addendum)
My office will be contacting you by phone for referral to ENT  and PFTs in [place]  - if you don't hear back from my office within one week please call us back or notify us thru MyChart and we'll address it right away.   Work on inhaler technique:  relax and gently blow all the way out then take a nice smooth full deep breath back in, triggering the inhaler at same time you start breathing in.  Hold breath in for at least  5 seconds if you can. Blow out symbicort 80  thru nose. Rinse and gargle with water when done.  If mouth or throat bother you at all,  try brushing teeth/gums/tongue with arm and hammer toothpaste/ make a slurry and gargle and spit out.       Please schedule a follow up visit in 3 months but call sooner if needed

## 2022-11-16 NOTE — Assessment & Plan Note (Addendum)
-  try off acei 06/30/2021  - change advair to symb 80 2bid and max rx for gerd  - alpha one  06/30/21    MM   Level  142 - Allergy profile 06/30/21  >  Eos 0.1 /  IgE  124 -Chest LDSCT   04/25/22  mild centrilobular emphysema/bronchiolitis - 11/16/2022  After extensive coaching inhaler device,  effectiveness =  90%   Her lungs are clear on exam but harsh cough / hoarseness persist so rec  stay on low dose symbicort so as not to aggravate the upper airway symptoms and return with pfts in 3 m

## 2022-11-21 DIAGNOSIS — E1122 Type 2 diabetes mellitus with diabetic chronic kidney disease: Secondary | ICD-10-CM | POA: Diagnosis not present

## 2022-11-21 DIAGNOSIS — N189 Chronic kidney disease, unspecified: Secondary | ICD-10-CM | POA: Diagnosis not present

## 2022-11-21 DIAGNOSIS — R808 Other proteinuria: Secondary | ICD-10-CM | POA: Diagnosis not present

## 2022-11-21 DIAGNOSIS — I5032 Chronic diastolic (congestive) heart failure: Secondary | ICD-10-CM | POA: Diagnosis not present

## 2022-11-21 DIAGNOSIS — D3502 Benign neoplasm of left adrenal gland: Secondary | ICD-10-CM | POA: Diagnosis not present

## 2022-11-21 DIAGNOSIS — E559 Vitamin D deficiency, unspecified: Secondary | ICD-10-CM | POA: Diagnosis not present

## 2022-11-24 ENCOUNTER — Other Ambulatory Visit: Payer: Self-pay | Admitting: "Endocrinology

## 2022-11-24 DIAGNOSIS — Z794 Long term (current) use of insulin: Secondary | ICD-10-CM

## 2022-11-27 ENCOUNTER — Ambulatory Visit (INDEPENDENT_AMBULATORY_CARE_PROVIDER_SITE_OTHER): Payer: 59 | Admitting: Internal Medicine

## 2022-11-27 DIAGNOSIS — R490 Dysphonia: Secondary | ICD-10-CM

## 2022-11-27 LAB — PULMONARY FUNCTION TEST
DL/VA % pred: 98 %
DL/VA: 4.3 ml/min/mmHg/L
DLCO cor % pred: 83 %
DLCO cor: 15.3 ml/min/mmHg
DLCO unc % pred: 83 %
DLCO unc: 15.3 ml/min/mmHg
FEF 25-75 Post: 2.59 L/sec
FEF 25-75 Pre: 1.37 L/sec
FEF2575-%Change-Post: 88 %
FEF2575-%Pred-Post: 107 %
FEF2575-%Pred-Pre: 57 %
FEV1-%Change-Post: 20 %
FEV1-%Pred-Post: 82 %
FEV1-%Pred-Pre: 68 %
FEV1-Post: 1.97 L
FEV1-Pre: 1.64 L
FEV1FVC-%Change-Post: 5 %
FEV1FVC-%Pred-Pre: 96 %
FEV6-%Change-Post: 13 %
FEV6-%Pred-Post: 82 %
FEV6-%Pred-Pre: 72 %
FEV6-Post: 2.44 L
FEV6-Pre: 2.14 L
FEV6FVC-%Change-Post: 0 %
FEV6FVC-%Pred-Post: 102 %
FEV6FVC-%Pred-Pre: 102 %
FVC-%Change-Post: 13 %
FVC-%Pred-Post: 80 %
FVC-%Pred-Pre: 70 %
FVC-Post: 2.44 L
FVC-Pre: 2.15 L
Post FEV1/FVC ratio: 81 %
Post FEV6/FVC ratio: 100 %
Pre FEV1/FVC ratio: 76 %
Pre FEV6/FVC Ratio: 100 %
RV % pred: 133 %
RV: 2.27 L
TLC % pred: 96 %
TLC: 4.38 L

## 2022-11-27 NOTE — Patient Instructions (Signed)
Full PFT Performed Today  

## 2022-11-27 NOTE — Progress Notes (Signed)
Full PFT Performed Today  

## 2022-11-29 DIAGNOSIS — E1165 Type 2 diabetes mellitus with hyperglycemia: Secondary | ICD-10-CM | POA: Diagnosis not present

## 2022-12-02 ENCOUNTER — Encounter: Payer: Self-pay | Admitting: Internal Medicine

## 2022-12-03 ENCOUNTER — Other Ambulatory Visit: Payer: Self-pay | Admitting: Cardiology

## 2022-12-04 DIAGNOSIS — E1122 Type 2 diabetes mellitus with diabetic chronic kidney disease: Secondary | ICD-10-CM | POA: Diagnosis not present

## 2022-12-04 DIAGNOSIS — R808 Other proteinuria: Secondary | ICD-10-CM | POA: Diagnosis not present

## 2022-12-04 DIAGNOSIS — I5032 Chronic diastolic (congestive) heart failure: Secondary | ICD-10-CM | POA: Diagnosis not present

## 2022-12-04 DIAGNOSIS — N189 Chronic kidney disease, unspecified: Secondary | ICD-10-CM | POA: Diagnosis not present

## 2022-12-08 ENCOUNTER — Telehealth: Payer: Self-pay

## 2022-12-08 NOTE — Telephone Encounter (Signed)
[employer] called stating pt's insurance no longer covers victoza. States it will cover trulicity, ozempic. Bydureon or rybelsus. Requested we send in Rx to pt's local pharmacy for either medication you would recommend replacing victoza with.

## 2022-12-11 ENCOUNTER — Telehealth: Payer: Self-pay | Admitting: *Deleted

## 2022-12-11 ENCOUNTER — Other Ambulatory Visit: Payer: Self-pay | Admitting: "Endocrinology

## 2022-12-11 MED ORDER — TRULICITY 1.5 MG/0.5ML ~~LOC~~ SOAJ: 1.5000 mg | SUBCUTANEOUS | 2 refills | Status: DC

## 2022-12-11 NOTE — Telephone Encounter (Signed)
Pt made aware

## 2022-12-11 NOTE — Telephone Encounter (Signed)
Patients insurance will not cover Symbicort.  Will route to pharmacy team to see if they can help [place] find covered alternatives.   Please advise Rx/ PA team can you help?  Thanks!

## 2022-12-12 ENCOUNTER — Encounter: Payer: Self-pay | Admitting: Obstetrics and Gynecology

## 2022-12-12 ENCOUNTER — Ambulatory Visit (INDEPENDENT_AMBULATORY_CARE_PROVIDER_SITE_OTHER): Payer: 59 | Admitting: Obstetrics and Gynecology

## 2022-12-12 VITALS — BP 120/75 | HR 101 | Ht 60.0 in | Wt 199.8 lb

## 2022-12-12 DIAGNOSIS — Z01419 Encounter for gynecological examination (general) (routine) without abnormal findings: Secondary | ICD-10-CM | POA: Diagnosis not present

## 2022-12-12 NOTE — Progress Notes (Signed)
Christine Weaver is a 57 y.o. [id number] female here for a routine annual gynecologic exam.  Current complaints: none.   Denies abnormal vaginal bleeding, discharge, pelvic pain, problems with intercourse or other gynecologic concerns.    Gynecologic History No LMP recorded. Patient is postmenopausal. Contraception: abstinence Last Pap: 2022. Results were: normal Last mammogram: 4/23. Results were: normal  Obstetric History OB History  Gravida Para Term Preterm AB Living  3 2 2   1 2  $ SAB IAB Ectopic Multiple Live Births               # Outcome Date GA Lbr Len/2nd Weight Sex Delivery Anes PTL Lv  3 AB           2 Term      CS-LTranv     1 Term      CS-LTranv       Past Medical History:  Diagnosis Date   Asthma    Bell's palsy    COPD (chronic obstructive pulmonary disease) ([place])    Hyperlipidemia    Hypertension    Lyme disease    Type 2 diabetes mellitus ([place])     Past Surgical History:  Procedure Laterality Date   CESAREAN SECTION     COLONOSCOPY WITH PROPOFOL N/A 12/16/2021   Procedure: COLONOSCOPY WITH PROPOFOL;  Surgeon: Harvel Quale, MD;  [facility];  Service: Gastroenterology;  Laterality: N/A;  [place]  12/16/2021   Procedure: HOT HEMOSTASIS (ARGON PLASMA COAGULATION/BICAP);  Surgeon: Montez Morita, Quillian Quince, MD;  Location: [facility];  Service: Gastroenterology;;   POLYPECTOMY  12/16/2021   Procedure: POLYPECTOMY;  Surgeon: Montez Morita, Quillian Quince, MD;  Location: [facility];  Service: Gastroenterology;;   RENAL BIOPSY  06/02/2022   SUBMUCOSAL TATTOO INJECTION  12/16/2021   Procedure: SUBMUCOSAL TATTOO INJECTION;  Surgeon: Harvel Quale, MD;  Location: [facility];  Service: Gastroenterology;;    Current Outpatient Medications on File Prior to Visit  Medication Sig Dispense Refill   albuterol (VENTOLIN HFA) 108 (90 Base) MCG/ACT inhaler Inhale 2 puffs into the lungs every 4 (four) hours as needed for  wheezing or shortness of breath. 18 g 5   atorvastatin (LIPITOR) 80 MG tablet TAKE 1 TABLET BY MOUTH EVERY DAY 90 tablet 1   BD PEN NEEDLE NANO 2ND GEN 32G X 4 MM MISC USE 1 PEN NEEDLE FOR 6 INJECTIONS DAILY FOR DIABETES E11.9 200 each 11   Continuous Blood Gluc Receiver (FREESTYLE LIBRE 2 READER) DEVI As directed 1 each 0   diclofenac Sodium (VOLTAREN) 1 % GEL Apply 1 Application topically 4 (four) times daily as needed (pain).     Dulaglutide (TRULICITY) 1.5 0000000 SOPN Inject 1.5 mg into the skin once a week. 2 mL 2   famotidine (PEPCID) 20 MG tablet TAKE 1 TABLET AFTER SUPPER 90 tablet 3   FARXIGA 5 MG TABS tablet Take 5 mg by mouth every other day.     Ferrous Sulfate (IRON PO) Take 1 tablet by mouth daily.     fluticasone (FLONASE) 50 MCG/ACT nasal spray Place 1 spray into both nostrils daily as needed for allergies.     furosemide (LASIX) 20 MG tablet TAKE 1 TABLET BY MOUTH TWICE A DAY 180 tablet 2   glucose blood (ACCU-CHEK AVIVA PLUS) test strip      HYDROcodone-acetaminophen (NORCO/VICODIN) 5-325 MG tablet Take 1 tablet by mouth every 12 (twelve) hours as needed for severe pain.     Insulin  Glargine (BASAGLAR KWIKPEN) 100 UNIT/ML Inject 70 Units into the skin at bedtime. 30 mL 1   insulin lispro (HUMALOG KWIKPEN) 100 UNIT/ML KwikPen Inject 5-11 Units into the skin 3 (three) times daily. 30 mL 1   irbesartan (AVAPRO) 75 MG tablet TAKE 1 TABLET BY MOUTH EVERY DAY 90 tablet 3   metFORMIN (GLUCOPHAGE) 500 MG tablet Take 1 tablet (500 mg total) by mouth 2 (two) times daily with a meal. 180 tablet 2   metFORMIN (GLUCOPHAGE) 500 MG tablet Take 1 tablet (500 mg total) by mouth 2 (two) times daily with a meal. 180 tablet 0   nystatin cream (MYCOSTATIN) Apply 1 application  topically 2 (two) times daily as needed (rash).     omeprazole (PRILOSEC) 40 MG capsule TAKE 1 CAPSULE (40 MG TOTAL) BY MOUTH DAILY. 90 capsule 1   OVER THE COUNTER MEDICATION Calcium one daily     OVER THE COUNTER  MEDICATION Iron 65 mg one bid     VITAMIN D PO Take 1 capsule by mouth daily.     No current facility-administered medications on file prior to visit.    Allergies  Allergen Reactions   Morphine And Related Itching   Other Other (See Comments)    Bells Palsy    Oxycodone-Acetaminophen Other (See Comments)    Constipates patient    Latex Itching and Other (See Comments)    Skin "cracks" open     Social History   Socioeconomic History   Marital status: Widowed    Spouse name: Not on file   Number of children: Not on file   Years of education: Not on file   Highest education level: Not on file  Occupational History   Not on file  Tobacco Use   Smoking status: Every Day    Packs/day: 0.50    Types: Cigarettes    Last attempt to quit: 10/29/2020    Years since quitting: 2.1   Smokeless tobacco: Never  Vaping Use   Vaping Use: Never used  Substance and Sexual Activity   Alcohol use: Never   Drug use: Never   Sexual activity: Not on file  Other Topics Concern   Not on file  Social History Narrative   Not on file   Social Determinants of Health   Financial Resource Strain: Low Risk  (12/12/2022)   Overall Financial Resource Strain (CARDIA)    Difficulty of Paying Living Expenses: Not hard at all  Food Insecurity: No Food Insecurity (12/12/2022)   Hunger Vital Sign    Worried About Running Out of Food in the Last Year: Never true    [place] in the Last Year: Never true  Transportation Needs: No Transportation Needs (12/12/2022)   PRAPARE - Hydrologist (Medical): No    Lack of Transportation (Non-Medical): No  Physical Activity: Insufficiently Active (12/12/2022)   Exercise Vital Sign    Days of Exercise per Week: 2 days    Minutes of Exercise per Session: 30 min  Stress: No Stress Concern Present (12/12/2022)   [place]    Feeling of Stress : Not at all   Social Connections: Unknown (12/12/2022)   Social Connection and Isolation Panel [NHANES]    Frequency of Communication with Friends and Family: More than three times a week    Frequency of Social Gatherings with Friends and Family: Patient refused    Attends Religious Services: Patient refused  Active Member of Clubs or Organizations: No    Attends Archivist Meetings: Never    Marital Status: Widowed  Intimate Partner Violence: Not At Risk (12/12/2022)   Humiliation, Afraid, Rape, and Kick questionnaire    Fear of Current or Ex-Partner: No    Emotionally Abused: No    Physically Abused: No    Sexually Abused: No    Family History  Problem Relation Age of Onset   Thyroid disease Mother    Hypertension Mother    Heart attack Mother    Stroke Mother    Heart failure Mother    Diabetes Mother    Emphysema Mother    Cancer Mother    Diabetes Father    Heart attack Father    Stroke Father    Kidney disease Father    Heart failure Father     The following portions of the patient's history were reviewed and updated as appropriate: allergies, current medications, past family history, past medical history, past social history, past surgical history and problem list.  Review of Systems Pertinent items noted in HPI and remainder of comprehensive ROS otherwise negative.   Objective:  BP 120/75 (BP Location: Left Arm, Patient Position: Sitting, Cuff Size: Normal)   Pulse (!) 101   Ht 5' (1.524 m)   Wt 199 lb 12.8 oz (90.6 kg)   BMI 39.02 kg/m  CONSTITUTIONAL: Well-developed, well-nourished female in no acute distress.  HENT:  Normocephalic, atraumatic, External right and left ear normal. Oropharynx is clear and moist EYES: Conjunctivae and EOM are normal. Pupils are equal, round, and reactive to light. No scleral icterus.  NECK: Normal range of motion, supple, no masses.  Normal thyroid.  SKIN: Skin is warm and dry. No rash noted. Not diaphoretic. No erythema. No  pallor. [place]: Alert and oriented to person, place, and time. Normal reflexes, muscle tone coordination. No cranial nerve deficit noted. PSYCHIATRIC: Normal mood and affect. Normal behavior. Normal judgment and thought content. CARDIOVASCULAR: Normal heart rate noted, regular rhythm RESPIRATORY: Clear to auscultation bilaterally. Effort and breath sounds normal, no problems with respiration noted. BREASTS: deferred ABDOMEN: Soft, normal bowel sounds, no distention noted.  No tenderness, rebound or guarding.  PELVIC: Normal appearing external genitalia; normal appearing vaginal mucosa and cervix.  No abnormal discharge noted.  Pap smear obtained.  Normal uterine size, no other palpable masses, no uterine or adnexal tenderness. Limited by pt habitus MUSCULOSKELETAL: Normal range of motion. No tenderness.  No cyanosis, clubbing, or edema.  2+ distal pulses.   Assessment:  Annual gynecologic examination    Plan:  Pap smear not indicated Pt to schedule mammogram for April 2024 Routine preventative health maintenance measures emphasized. Please refer to After Visit Summary for other counseling recommendations.    Chancy Milroy, MD, [place] Attending [place] for [facility], [place]

## 2022-12-13 ENCOUNTER — Other Ambulatory Visit (HOSPITAL_COMMUNITY): Payer: Self-pay

## 2022-12-13 DIAGNOSIS — G8929 Other chronic pain: Secondary | ICD-10-CM | POA: Diagnosis not present

## 2022-12-13 DIAGNOSIS — M5431 Sciatica, right side: Secondary | ICD-10-CM | POA: Diagnosis not present

## 2022-12-13 DIAGNOSIS — M542 Cervicalgia: Secondary | ICD-10-CM | POA: Diagnosis not present

## 2022-12-13 DIAGNOSIS — R202 Paresthesia of skin: Secondary | ICD-10-CM | POA: Diagnosis not present

## 2022-12-13 DIAGNOSIS — M5432 Sciatica, left side: Secondary | ICD-10-CM | POA: Diagnosis not present

## 2022-12-13 DIAGNOSIS — M545 Low back pain, unspecified: Secondary | ICD-10-CM | POA: Diagnosis not present

## 2022-12-13 DIAGNOSIS — G894 Chronic pain syndrome: Secondary | ICD-10-CM | POA: Diagnosis not present

## 2022-12-13 DIAGNOSIS — Z79891 Long term (current) use of opiate analgesic: Secondary | ICD-10-CM | POA: Diagnosis not present

## 2022-12-13 DIAGNOSIS — M5136 Other intervertebral disc degeneration, lumbar region: Secondary | ICD-10-CM | POA: Diagnosis not present

## 2022-12-13 DIAGNOSIS — E1142 Type 2 diabetes mellitus with diabetic polyneuropathy: Secondary | ICD-10-CM | POA: Diagnosis not present

## 2022-12-13 NOTE — Telephone Encounter (Signed)
Called and left patient a detailed vm on cell phone (ok per dpr) letting her know Symbicort is covered per pharmacy team and co pay is $40. Advised patient to call back if copay was too much for her and she wanted [place] to send a message to provider for alternatives. Nothing further needed for now

## 2022-12-13 NOTE — Telephone Encounter (Signed)
Per test claim generic Symbicort shows that medication is covered at this time.

## 2022-12-14 ENCOUNTER — Ambulatory Visit (INDEPENDENT_AMBULATORY_CARE_PROVIDER_SITE_OTHER): Payer: 59 | Admitting: Internal Medicine

## 2022-12-14 ENCOUNTER — Encounter: Payer: Self-pay | Admitting: Internal Medicine

## 2022-12-14 VITALS — BP 129/73 | HR 92 | Ht 60.0 in | Wt 200.0 lb

## 2022-12-14 DIAGNOSIS — Z794 Long term (current) use of insulin: Secondary | ICD-10-CM | POA: Diagnosis not present

## 2022-12-14 DIAGNOSIS — J4489 Other specified chronic obstructive pulmonary disease: Secondary | ICD-10-CM

## 2022-12-14 DIAGNOSIS — E1122 Type 2 diabetes mellitus with diabetic chronic kidney disease: Secondary | ICD-10-CM

## 2022-12-14 DIAGNOSIS — M25561 Pain in right knee: Secondary | ICD-10-CM | POA: Diagnosis not present

## 2022-12-14 DIAGNOSIS — N1831 Chronic kidney disease, stage 3a: Secondary | ICD-10-CM | POA: Diagnosis not present

## 2022-12-14 DIAGNOSIS — I5032 Chronic diastolic (congestive) heart failure: Secondary | ICD-10-CM | POA: Diagnosis not present

## 2022-12-14 DIAGNOSIS — I10 Essential (primary) hypertension: Secondary | ICD-10-CM | POA: Diagnosis not present

## 2022-12-14 DIAGNOSIS — G894 Chronic pain syndrome: Secondary | ICD-10-CM

## 2022-12-14 DIAGNOSIS — J9611 Chronic respiratory failure with hypoxia: Secondary | ICD-10-CM

## 2022-12-14 DIAGNOSIS — Z6839 Body mass index (BMI) 39.0-39.9, adult: Secondary | ICD-10-CM

## 2022-12-14 MED ORDER — TIRZEPATIDE 5 MG/0.5ML ~~LOC~~ SOAJ: 5.0000 mg | SUBCUTANEOUS | 0 refills | Status: DC

## 2022-12-14 NOTE — Assessment & Plan Note (Signed)
Uses 2 lpm O2 PRN F/u Pulmonology Quit smoking in 2022, smoked about 0.5 pack/day for about 35 years

## 2022-12-14 NOTE — Patient Instructions (Signed)
Please start taking Irbesartan as prescribed.  Please start taking Mounjaro instead of Victoza.  Please apply Voltaren gel or Icyhot for knee pain.  Please continue to follow low carb diet and ambulate as tolerated.

## 2022-12-14 NOTE — Progress Notes (Signed)
Established Patient Office Visit  Subjective:  Patient ID: Christine Weaver, female    DOB: Nov 20, 1965  Age: 57 y.o. MRN: [id number]  CC:  Chief Complaint  Patient presents with   Diabetes    Six month follow up for diabetes and COPD. Right knee pain in the knee cap    HPI Christine Weaver is a 57 y.o. female with past medical history of COPD, chronic respiratory failure, HTN, HFpEF and type 2 DM who presents for f/u of her chronic medical conditions.  HTN: BP is well-controlled. She is not sure if she is taking Irbesartan. Patient denies headache, dizziness, chest pain, dyspnea or palpitations.   HFpEF: She denies orthopnea or PND. Takes Lasix regularly.  COPD: On home O2 - 2 lpm PRN now, she is benefiting from oxygen usage during episodes of dyspnea/hypoxia. Uses Symbicort regularly. Denies any need to use Albuterol recently. Denies dyspnea or wheezing currently.  Type II DM with HLD: Her HbA1C has slightly increased to 7.8.  She has brought her reader of CGM.  She has been taking Basaglar 70 units with Humalog ISS.  She was recently switched to Trulicity from [place], but has not started it yet.  She has been trying to cut down soft drinks, but admits that she has sweets when her kids cook for her.  She is trying to cut down on bread products.  CKD: Followed by Dr Theador Hawthorne.  She has nephrotic range proteinuria, and has been placed on [place] every other day and [place] Clamp now. She denies any dysuria, hematuria, or urinary hesitancy or resistance. She recently had renal biopsy.  She complains of right knee pain, which is acute onset since 12/12/21.  Her pain is constant, sharp, nonradiating and is worse with extension.  She has been on chronic opioid medications for chronic pain.  Denies any recent injury.    Past Medical History:  Diagnosis Date   Asthma    Bell's palsy    COPD (chronic obstructive pulmonary disease) ([place])    Hyperlipidemia    Hypertension    Lyme disease    Type 2  diabetes mellitus ([place])     Past Surgical History:  Procedure Laterality Date   CESAREAN SECTION     COLONOSCOPY WITH PROPOFOL N/A 12/16/2021   Procedure: COLONOSCOPY WITH PROPOFOL;  Surgeon: Harvel Quale, MD;  [facility];  Service: Gastroenterology;  Laterality: N/A;  [place]  12/16/2021   Procedure: HOT HEMOSTASIS (ARGON PLASMA COAGULATION/BICAP);  Surgeon: Montez Morita, Quillian Quince, MD;  Location: [facility];  Service: Gastroenterology;;   POLYPECTOMY  12/16/2021   Procedure: POLYPECTOMY;  Surgeon: Harvel Quale, MD;  [facility];  Service: Gastroenterology;;   RENAL BIOPSY  06/02/2022   SUBMUCOSAL TATTOO INJECTION  12/16/2021   Procedure: SUBMUCOSAL TATTOO INJECTION;  Surgeon: Harvel Quale, MD;  [facility];  Service: Gastroenterology;;    Family History  Problem Relation Age of Onset   Thyroid disease Mother    Hypertension Mother    Heart attack Mother    Stroke Mother    Heart failure Mother    Diabetes Mother    Emphysema Mother    Cancer Mother    Diabetes Father    Heart attack Father    Stroke Father    Kidney disease Father    Heart failure Father     Social History   Socioeconomic History   Marital status: Widowed    Spouse name: Not  on file   Number of children: Not on file   Years of education: Not on file   Highest education level: Not on file  Occupational History   Not on file  Tobacco Use   Smoking status: Every Day    Packs/day: 0.50    Types: Cigarettes    Last attempt to quit: 10/29/2020    Years since quitting: 2.1   Smokeless tobacco: Never  Vaping Use   Vaping Use: Never used  Substance and Sexual Activity   Alcohol use: Never   Drug use: Never   Sexual activity: Not on file  Other Topics Concern   Not on file  Social History Narrative   Not on file   Social Determinants of Health   Financial Resource Strain: Low Risk  (12/12/2022)    Overall Financial Resource Strain (CARDIA)    Difficulty of Paying Living Expenses: Not hard at all  Food Insecurity: No Food Insecurity (12/12/2022)   Hunger Vital Sign    Worried About Running Out of Food in the Last Year: Never true    [place] in the Last Year: Never true  Transportation Needs: No Transportation Needs (12/12/2022)   PRAPARE - Hydrologist (Medical): No    Lack of Transportation (Non-Medical): No  Physical Activity: Insufficiently Active (12/12/2022)   Exercise Vital Sign    Days of Exercise per Week: 2 days    Minutes of Exercise per Session: 30 min  Stress: No Stress Concern Present (12/12/2022)   [place]    Feeling of Stress : Not at all  Social Connections: Unknown (12/12/2022)   Social Connection and Isolation Panel [NHANES]    Frequency of Communication with Friends and Family: More than three times a week    Frequency of Social Gatherings with Friends and Family: Patient refused    Attends Religious Services: Patient refused    Marine scientist or Organizations: No    Attends Archivist Meetings: Never    Marital Status: Widowed  Intimate Partner Violence: Not At Risk (12/12/2022)   Humiliation, Afraid, Rape, and Kick questionnaire    Fear of Current or Ex-Partner: No    Emotionally Abused: No    Physically Abused: No    Sexually Abused: No    Outpatient Medications Prior to Visit  Medication Sig Dispense Refill   albuterol (VENTOLIN HFA) 108 (90 Base) MCG/ACT inhaler Inhale 2 puffs into the lungs every 4 (four) hours as needed for wheezing or shortness of breath. 18 g 5   atorvastatin (LIPITOR) 80 MG tablet TAKE 1 TABLET BY MOUTH EVERY DAY 90 tablet 1   BD PEN NEEDLE NANO 2ND GEN 32G X 4 MM MISC USE 1 PEN NEEDLE FOR 6 INJECTIONS DAILY FOR DIABETES E11.9 200 each 11   Continuous Blood Gluc Receiver (FREESTYLE LIBRE 2 READER) DEVI As  directed 1 each 0   diclofenac Sodium (VOLTAREN) 1 % GEL Apply 1 Application topically 4 (four) times daily as needed (pain).     famotidine (PEPCID) 20 MG tablet TAKE 1 TABLET AFTER SUPPER 90 tablet 3   FARXIGA 5 MG TABS tablet Take 5 mg by mouth every other day.     Ferrous Sulfate (IRON PO) Take 1 tablet by mouth daily.     fluticasone (FLONASE) 50 MCG/ACT nasal spray Place 1 spray into both nostrils daily as needed for allergies.     furosemide (LASIX) 20  MG tablet TAKE 1 TABLET BY MOUTH TWICE A DAY 180 tablet 2   glucose blood (ACCU-CHEK AVIVA PLUS) test strip      HYDROcodone-acetaminophen (NORCO/VICODIN) 5-325 MG tablet Take 1 tablet by mouth every 12 (twelve) hours as needed for severe pain.     Insulin Glargine (BASAGLAR KWIKPEN) 100 UNIT/ML Inject 70 Units into the skin at bedtime. 30 mL 1   insulin lispro (HUMALOG KWIKPEN) 100 UNIT/ML KwikPen Inject 5-11 Units into the skin 3 (three) times daily. 30 mL 1   irbesartan (AVAPRO) 75 MG tablet TAKE 1 TABLET BY MOUTH EVERY DAY 90 tablet 3   metFORMIN (GLUCOPHAGE) 500 MG tablet Take 1 tablet (500 mg total) by mouth 2 (two) times daily with a meal. 180 tablet 2   nystatin cream (MYCOSTATIN) Apply 1 application  topically 2 (two) times daily as needed (rash).     omeprazole (PRILOSEC) 40 MG capsule TAKE 1 CAPSULE (40 MG TOTAL) BY MOUTH DAILY. 90 capsule 1   OVER THE COUNTER MEDICATION Calcium one daily     OVER THE COUNTER MEDICATION Iron 65 mg one bid     VITAMIN D PO Take 1 capsule by mouth daily.     Dulaglutide (TRULICITY) 1.5 0000000 SOPN Inject 1.5 mg into the skin once a week. 2 mL 2   metFORMIN (GLUCOPHAGE) 500 MG tablet Take 1 tablet (500 mg total) by mouth 2 (two) times daily with a meal. 180 tablet 0   No facility-administered medications prior to visit.    Allergies  Allergen Reactions   Morphine And Related Itching   Other Other (See Comments)    Bells Palsy    Oxycodone-Acetaminophen Other (See Comments)    Constipates  patient    Latex Itching and Other (See Comments)    Skin "cracks" open     ROS Review of Systems  Constitutional:  Negative for chills and fever.  HENT:  Negative for congestion, sinus pressure, sinus pain and sore throat.   Eyes:  Negative for pain and discharge.  Respiratory:  Negative for cough, shortness of breath and wheezing.   Cardiovascular:  Negative for chest pain and palpitations.  Gastrointestinal:  Negative for abdominal pain, constipation, diarrhea, nausea and vomiting.  Endocrine: Negative for polydipsia and polyuria.  Genitourinary:  Negative for dysuria and hematuria.  Musculoskeletal:  Positive for arthralgias and back pain. Negative for neck pain and neck stiffness.  Skin:  Negative for rash.  Neurological:  Negative for dizziness and weakness.  Psychiatric/Behavioral:  Negative for agitation and behavioral problems.       Objective:    Physical Exam Vitals reviewed.  Constitutional:      General: She is not in acute distress.    Appearance: She is not diaphoretic.  HENT:     Head: Normocephalic and atraumatic.     Nose: Nose normal.     Mouth/Throat:     Mouth: Mucous membranes are moist.  Eyes:     General: No scleral icterus.    Extraocular Movements: Extraocular movements intact.  Cardiovascular:     Rate and Rhythm: Normal rate and regular rhythm.     Pulses: Normal pulses.     Heart sounds: Normal heart sounds. No murmur heard. Pulmonary:     Breath sounds: Normal breath sounds. No wheezing or rales.  Musculoskeletal:     Cervical back: Neck supple. No tenderness.     Right lower leg: No edema.     Left lower leg: No edema.  Comments: About 0.5 cm cyst over right hand 3rd digit over proximal phalanx  Skin:    General: Skin is warm.     Findings: No rash.  Neurological:     General: No focal deficit present.     Mental Status: She is alert and oriented to person, place, and time.     Sensory: No sensory deficit.     Motor: No  weakness.  Psychiatric:        Mood and Affect: Mood normal.        Behavior: Behavior normal.     BP 129/73 (BP Location: Left Arm, Patient Position: Sitting, Cuff Size: Large)   Pulse 92   Ht 5' (1.524 m)   Wt 200 lb (90.7 kg)   SpO2 92%   BMI 39.06 kg/m  Wt Readings from Last 3 Encounters:  12/14/22 200 lb (90.7 kg)  12/12/22 199 lb 12.8 oz (90.6 kg)  11/16/22 200 lb (90.7 kg)    Lab Results  Component Value Date   TSH 3.110 04/06/2022   Lab Results  Component Value Date   WBC 9.5 06/02/2022   HGB 12.2 06/02/2022   HCT 39.2 06/02/2022   MCV 76.7 (L) 06/02/2022   PLT 241 06/02/2022   Lab Results  Component Value Date   NA 139 11/09/2022   K 4.3 11/09/2022   CO2 24 11/09/2022   GLUCOSE 149 (H) 11/09/2022   BUN 16 11/09/2022   CREATININE 1.25 (H) 11/09/2022   BILITOT 0.3 11/09/2022   ALKPHOS 118 11/09/2022   AST 15 11/09/2022   ALT 14 11/09/2022   PROT 6.8 11/09/2022   ALBUMIN 4.0 11/09/2022   CALCIUM 10.0 11/09/2022   ANIONGAP 10 12/14/2021   EGFR 51 (L) 11/09/2022   Lab Results  Component Value Date   CHOL 193 11/09/2022   Lab Results  Component Value Date   HDL 40 11/09/2022   Lab Results  Component Value Date   LDLCALC 108 (H) 11/09/2022   Lab Results  Component Value Date   TRIG 260 (H) 11/09/2022   Lab Results  Component Value Date   CHOLHDL 4.8 (H) 11/09/2022   Lab Results  Component Value Date   HGBA1C 7.8 (A) 11/13/2022      Assessment & Plan:   Problem List Items Addressed This Visit       Cardiovascular and Mediastinum   Essential hypertension, benign - Primary    BP Readings from Last 1 Encounters:  12/14/22 129/73  Well-controlled with Irbesartan now, but needs to take it regularly for proteinuria Counseled for compliance with the medications Advised DASH diet and moderate exercise/walking, at least 150 mins/week      (HFpEF) heart failure with preserved ejection fraction ([place])    Appears euvolemic Continue  Lasix On Farxiga Follows up with Cardiologist        Respiratory   Asthmatic bronchitis , chronic    Well-controlled On Symbicort Uses Albuterol nebs and inhaler PRN On home O2 - 2 lpm PRN      Chronic respiratory failure with hypoxia (H[facility])    Uses 2 lpm O2 PRN F/u Pulmonology Quit smoking in 2022, smoked about 0.5 pack/day for about 35 years        Endocrine   Type 2 diabetes mellitus with stage 3a chronic kidney disease, with long-term current use of insulin ([place])    Lab Results  Component Value Date   HGBA1C 7.8 (A) 11/13/2022  Uncontrolled Takes Basaglar 70 U qHS, Humalog 14-20 U TID  according to sliding scale Victoza 1.8 mg QD, but is non-formulary now Switched to [facility] for better glycemic profile and has better availability than Trulicity  Metformin [id number] mg BID On SGLT2i as she has HFpEF and CKD Follows up with Dr Dorris Fetch On statin and ARB Referred to Ophthalmology for diabetic eye exam      Relevant Medications   tirzepatide [facility]) 5 MG/0.5ML Pen     Genitourinary   Stage 3a chronic kidney disease ([place])    Followed by Dr Theador Hawthorne Last BMP reviewed On Farxiga and [place] for proteinuria Needs to take Irbesartan        Other   Chronic pain syndrome    Due to Lyme's disease complication On Norco Follows up with Pain specialist      Class 2 severe obesity due to excess calories with serious comorbidity and body mass index (BMI) of 39.0 to 39.9 in adult [facility])    Diet modification and moderate exercise/walking advised. On Victoza for diabetes, switched to [facility]      Relevant Medications   tirzepatide [facility]) 5 MG/0.5ML Pen   Acute pain of right knee    Could be synovitis Has mild tenderness and effusion She takes Norco PRN for pain Advised to apply Voltaren gel PRN for pain Avoid strenuous exercise for 1 week      Meds ordered this encounter  Medications   tirzepatide (MOUNJARO) 5 MG/0.5ML Pen    Sig: Inject 5 mg into the skin once  a week.    Dispense:  6 mL    Refill:  0    Follow-up: Return in about 6 months (around 06/14/2023) for DM and CKD.    Lindell Spar, MD

## 2022-12-14 NOTE — Assessment & Plan Note (Addendum)
Followed by Dr Theador Hawthorne Last BMP reviewed On Farxiga and Asa Lente for proteinuria Needs to take Irbesartan

## 2022-12-14 NOTE — Assessment & Plan Note (Signed)
Diet modification and moderate exercise/walking advised. On Victoza for diabetes, switched to [facility]

## 2022-12-14 NOTE — Assessment & Plan Note (Addendum)
Appears euvolemic Continue Lasix On Farxiga Follows up with Cardiologist

## 2022-12-14 NOTE — Assessment & Plan Note (Addendum)
Lab Results  Component Value Date   HGBA1C 7.8 (A) 11/13/2022   Uncontrolled Takes Basaglar 70 U qHS, Humalog 14-20 U TID according to sliding scale Victoza 1.8 mg QD, but is non-formulary now Switched to [facility] for better glycemic profile and has better availability than Trulicity  Metformin [id number] mg BID On SGLT2i as she has HFpEF and CKD Follows up with Dr Dorris Fetch On statin and ARB Referred to Ophthalmology for diabetic eye exam

## 2022-12-14 NOTE — Assessment & Plan Note (Addendum)
Well-controlled On Symbicort Uses Albuterol nebs and inhaler PRN On home O2 - 2 lpm PRN

## 2022-12-14 NOTE — Assessment & Plan Note (Signed)
Could be synovitis Has mild tenderness and effusion She takes Norco PRN for pain Advised to apply Voltaren gel PRN for pain Avoid strenuous exercise for 1 week

## 2022-12-14 NOTE — Assessment & Plan Note (Signed)
Due to Lyme's disease complication On Norco Follows up with Pain specialist

## 2022-12-14 NOTE — Assessment & Plan Note (Addendum)
BP Readings from Last 1 Encounters:  12/14/22 129/73   Well-controlled with Irbesartan now, but needs to take it regularly for proteinuria Counseled for compliance with the medications Advised DASH diet and moderate exercise/walking, at least 150 mins/week

## 2022-12-17 DIAGNOSIS — I509 Heart failure, unspecified: Secondary | ICD-10-CM | POA: Diagnosis not present

## 2022-12-17 DIAGNOSIS — J449 Chronic obstructive pulmonary disease, unspecified: Secondary | ICD-10-CM | POA: Diagnosis not present

## 2023-01-03 ENCOUNTER — Other Ambulatory Visit: Payer: Self-pay | Admitting: "Endocrinology

## 2023-01-03 ENCOUNTER — Other Ambulatory Visit: Payer: Self-pay | Admitting: Internal Medicine

## 2023-01-03 ENCOUNTER — Other Ambulatory Visit: Payer: Self-pay | Admitting: Nurse Practitioner

## 2023-01-03 DIAGNOSIS — E119 Type 2 diabetes mellitus without complications: Secondary | ICD-10-CM

## 2023-01-03 DIAGNOSIS — E1122 Type 2 diabetes mellitus with diabetic chronic kidney disease: Secondary | ICD-10-CM

## 2023-01-10 DIAGNOSIS — M5136 Other intervertebral disc degeneration, lumbar region: Secondary | ICD-10-CM | POA: Diagnosis not present

## 2023-01-10 DIAGNOSIS — E1142 Type 2 diabetes mellitus with diabetic polyneuropathy: Secondary | ICD-10-CM | POA: Diagnosis not present

## 2023-01-10 DIAGNOSIS — G8929 Other chronic pain: Secondary | ICD-10-CM | POA: Diagnosis not present

## 2023-01-10 DIAGNOSIS — M545 Low back pain, unspecified: Secondary | ICD-10-CM | POA: Diagnosis not present

## 2023-01-10 DIAGNOSIS — G894 Chronic pain syndrome: Secondary | ICD-10-CM | POA: Diagnosis not present

## 2023-01-10 DIAGNOSIS — M542 Cervicalgia: Secondary | ICD-10-CM | POA: Diagnosis not present

## 2023-01-10 DIAGNOSIS — M5432 Sciatica, left side: Secondary | ICD-10-CM | POA: Diagnosis not present

## 2023-01-10 DIAGNOSIS — M5431 Sciatica, right side: Secondary | ICD-10-CM | POA: Diagnosis not present

## 2023-01-10 DIAGNOSIS — Z79891 Long term (current) use of opiate analgesic: Secondary | ICD-10-CM | POA: Diagnosis not present

## 2023-01-10 DIAGNOSIS — R202 Paresthesia of skin: Secondary | ICD-10-CM | POA: Diagnosis not present

## 2023-01-15 DIAGNOSIS — J449 Chronic obstructive pulmonary disease, unspecified: Secondary | ICD-10-CM | POA: Diagnosis not present

## 2023-01-15 DIAGNOSIS — I509 Heart failure, unspecified: Secondary | ICD-10-CM | POA: Diagnosis not present

## 2023-01-22 DIAGNOSIS — R808 Other proteinuria: Secondary | ICD-10-CM | POA: Diagnosis not present

## 2023-01-22 DIAGNOSIS — N189 Chronic kidney disease, unspecified: Secondary | ICD-10-CM | POA: Diagnosis not present

## 2023-01-22 DIAGNOSIS — E1122 Type 2 diabetes mellitus with diabetic chronic kidney disease: Secondary | ICD-10-CM | POA: Diagnosis not present

## 2023-01-22 DIAGNOSIS — I5032 Chronic diastolic (congestive) heart failure: Secondary | ICD-10-CM | POA: Diagnosis not present

## 2023-01-29 DIAGNOSIS — E559 Vitamin D deficiency, unspecified: Secondary | ICD-10-CM | POA: Diagnosis not present

## 2023-01-29 DIAGNOSIS — E1122 Type 2 diabetes mellitus with diabetic chronic kidney disease: Secondary | ICD-10-CM | POA: Diagnosis not present

## 2023-01-29 DIAGNOSIS — N189 Chronic kidney disease, unspecified: Secondary | ICD-10-CM | POA: Diagnosis not present

## 2023-01-29 DIAGNOSIS — R808 Other proteinuria: Secondary | ICD-10-CM | POA: Diagnosis not present

## 2023-01-29 DIAGNOSIS — I5032 Chronic diastolic (congestive) heart failure: Secondary | ICD-10-CM | POA: Diagnosis not present

## 2023-02-07 ENCOUNTER — Other Ambulatory Visit: Payer: Self-pay | Admitting: Internal Medicine

## 2023-02-07 DIAGNOSIS — M5431 Sciatica, right side: Secondary | ICD-10-CM | POA: Diagnosis not present

## 2023-02-07 DIAGNOSIS — M5136 Other intervertebral disc degeneration, lumbar region: Secondary | ICD-10-CM | POA: Diagnosis not present

## 2023-02-07 DIAGNOSIS — M545 Low back pain, unspecified: Secondary | ICD-10-CM | POA: Diagnosis not present

## 2023-02-07 DIAGNOSIS — M5432 Sciatica, left side: Secondary | ICD-10-CM | POA: Diagnosis not present

## 2023-02-07 DIAGNOSIS — Z79891 Long term (current) use of opiate analgesic: Secondary | ICD-10-CM | POA: Diagnosis not present

## 2023-02-07 DIAGNOSIS — G894 Chronic pain syndrome: Secondary | ICD-10-CM | POA: Diagnosis not present

## 2023-02-07 DIAGNOSIS — R202 Paresthesia of skin: Secondary | ICD-10-CM | POA: Diagnosis not present

## 2023-02-07 DIAGNOSIS — M542 Cervicalgia: Secondary | ICD-10-CM | POA: Diagnosis not present

## 2023-02-07 DIAGNOSIS — E7849 Other hyperlipidemia: Secondary | ICD-10-CM

## 2023-02-07 DIAGNOSIS — E1142 Type 2 diabetes mellitus with diabetic polyneuropathy: Secondary | ICD-10-CM | POA: Diagnosis not present

## 2023-02-07 DIAGNOSIS — G8929 Other chronic pain: Secondary | ICD-10-CM | POA: Diagnosis not present

## 2023-02-12 ENCOUNTER — Other Ambulatory Visit: Payer: Self-pay | Admitting: Internal Medicine

## 2023-02-15 DIAGNOSIS — I509 Heart failure, unspecified: Secondary | ICD-10-CM | POA: Diagnosis not present

## 2023-02-15 DIAGNOSIS — J449 Chronic obstructive pulmonary disease, unspecified: Secondary | ICD-10-CM | POA: Diagnosis not present

## 2023-02-18 NOTE — Progress Notes (Unsigned)
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  Quit work 2004 at 89   After moving to [facility] 2012 saw [employer] doctor feeling sluggish and dx with asthma > meds didn't help much then started doe which seemed better with alb neb then added advair around 2014/15 then 02     History of Present Illness  06/30/2021  Pulmonary/ 1st office eval/ Christine Weaver / [place] [facility]  Chief Complaint  Patient presents with   Follow-up    Pt. Is on 2L O2 when sleeping and out running errands but tries not to wear it in the house unless she needs it. Coughing but not normally coughing up mucus.   Dyspnea:  foodlion 50% using 02 2lpm/  Cough: always coughing some very hoarse x years >minimally productive  Sleep: 45 degrees with pillows  SABA use: no longer needing since 02  02 :  2lpm at bedtime and prn daytime  Intol of dpi  Rec Omeprazole 40 mg Take  30-60 min before first meal of the day and Pepcid (famotidine)  20 mg after supper until return to office - this is the best way to tell whether stomach acid is contributing to your problem.   GERD rx Stop advair and start symbicort 80 Take 2 puffs first thing in am and then another 2 puffs about 12 hours later.  Work on inhaler technique: Marland Kitchen  Make sure you check your oxygen saturation  at your highest level of activity   Please schedule a follow up office visit in 6-8  weeks, call sooner if needed with pfts on return   08/30/2021  f/u ov/[place] office/Christine Weaver re:  maint on symbicort 80 2bid     Chief Complaint  Patient presents with   Follow-up    2 lpm all of the time. Persistent dry cough. Feels inhaler is helping.   Dyspnea:   push mower x 20 min s 02  says after stopped 89% but not checking  while exerting  Cough: dry  Sleeping: flat bed 45  degrees SABA use: minimal  02: 2lpm hs and prn  Covid status: vax 3  Lung cancer screening: referred  Rec We will have our lung screening nurse practioner call you to set  up follow up CT not due til Feb 2023 Make sure you check your oxygen saturation  at your highest level of activity  to be sure it stays over 90% Keep your appt for your PFTs  Please schedule a follow up visit in 6  months but call sooner if needed  Late add not on amlodipine or lisinopril so started avapro 75 mg daily     03/06/2022  f/u ov/[place] office/Christine Weaver re: COPD/AB maint on symbicort 80 2bid   Chief Complaint  Patient presents with   Follow-up    Since allergy season has started patient has been having to use portable O2 while out   Dyspnea:  says can walk the whole store@ [employer] or [employer] / mall walking slower than others = MMRC2 = can't walk a nl pace on a flat grade s sob but does fine slow and flat eg  Cough: min mucoid at hs but not does not keep her up once asleep Sleeping: 45 degrees with pillows / sleeps with cat  SABA use: up to 3 x daily  due to "allergy season" 02: 2lpm NP when  "cat wakes her up because 02 drops"  Covid status: x 3  Lung cancer screening: due  Rec I will be referring you to ENT for chronic hoarseness > not done as of 02/19/2023  We will be calling to arrange for you to start lung cancer screening  We will be scheduling PFTs  in [place] either when you go for ENT or pain management  Work on inhaler technique:   Please schedule a follow up visit in 6 months but call sooner if needed       02/19/2023  f/u ov/[place] office/Christine Weaver re: AB GOLD 0  maint on symbicort 80   Chief Complaint  Patient presents with   Follow-up    Pt f/u states that she is feeling well other than having allergy problems  Dyspnea:  working out with treadmill 4 x weekly x 30 min at top speed 1.2 mph x incline all the way up with doe s 02 or monitoring sats as rec  Cough: with pollen/ min prod  white mucus  Sleeping: flat bed pillows  SABA use: 2x per week 02: 2lpm/  prn daytime   No obvious day to day or daytime variability or assoc excess/ purulent sputum or mucus plugs or hemoptysis or cp or chest tightness, subjective wheeze or overt sinus or hb symptoms.   Sleeping  without nocturnal  or early am exacerbation  of respiratory  c/o's or need for noct saba. Also denies any obvious fluctuation of symptoms with weather or environmental changes or other aggravating or alleviating factors except as outlined above   No unusual exposure hx or h/o childhood pna/ asthma or knowledge of premature birth.  Current Allergies, Complete Past Medical History, Past Surgical History, Family History, and Social History were reviewed in [employer] record.  ROS  The following are not active complaints unless bolded Hoarseness, sore throat, dysphagia, dental problems, itching, sneezing,  nasal congestion or discharge of excess mucus or purulent secretions, ear ache,   fever, chills, sweats, unintended wt loss or wt gain, classically pleuritic or exertional cp,  orthopnea pnd or arm/hand swelling  or leg swelling, presyncope, palpitations, abdominal pain, anorexia, nausea, vomiting, diarrhea  or change in bowel habits or change in bladder habits, change in stools or change in urine, dysuria, hematuria,  rash, arthralgias, visual complaints, headache, numbness, weakness or ataxia or problems with walking or coordination,  change in mood or  memory.        Current Meds  Medication Sig   albuterol (VENTOLIN HFA) 108 (90 Base) MCG/ACT inhaler Inhale 2 puffs into the lungs every 4 (four) hours as needed for wheezing or shortness of breath.   atorvastatin (LIPITOR) 80 MG tablet TAKE 1 TABLET BY MOUTH EVERY DAY   BD PEN NEEDLE NANO 2ND GEN 32G X 4 MM MISC USE 1 PEN NEEDLE FOR 6 INJECTIONS DAILY FOR DIABETES E11.9   budesonide-formoterol (SYMBICORT) 80-4.5 MCG/ACT inhaler TAKE 2 PUFFS  FIRST THING IN AM AND THEN ANOTHER 2 PUFFS ABOUT 12 HOURS LATER   Continuous Blood Gluc Receiver (FREESTYLE LIBRE 2 READER) DEVI As directed   diclofenac Sodium (VOLTAREN) 1 % GEL Apply 1 Application topically 4 (four) times daily as needed (pain).   famotidine (PEPCID) 20 MG tablet TAKE 1 TABLET AFTER SUPPER   FARXIGA 5 MG TABS tablet Take 5 mg by mouth every other day.   Ferrous Sulfate (IRON  PO) Take 1 tablet by mouth daily.   fluticasone (FLONASE) 50 MCG/ACT nasal spray Place 1 spray into both nostrils daily as needed for allergies.   furosemide (LASIX) 20 MG tablet TAKE 1 TABLET BY MOUTH TWICE A DAY   glucose blood (ACCU-CHEK AVIVA PLUS) test strip    HYDROcodone-acetaminophen (NORCO/VICODIN) 5-325 MG tablet Take 1 tablet by mouth every 12 (twelve) hours as needed for severe pain.   insulin glargine (LANTUS SOLOSTAR) 100 UNIT/ML Solostar Pen Inject 70 Units into the skin at bedtime.   insulin lispro (HUMALOG KWIKPEN) 100 UNIT/ML KwikPen INJECT 5-11 UNITS INTO THE SKIN 3 (THREE) TIMES DAILY.   irbesartan (AVAPRO) 75 MG tablet TAKE 1 TABLET BY MOUTH EVERY DAY   metFORMIN (GLUCOPHAGE) 500 MG tablet TAKE 1 TABLET BY MOUTH 2 TIMES DAILY WITH A MEAL.   nystatin cream (MYCOSTATIN) Apply 1 application  topically 2 (two) times daily as needed (rash).   omeprazole (PRILOSEC) 40 MG capsule TAKE 1 CAPSULE (40 MG TOTAL) BY MOUTH DAILY.   OVER THE COUNTER MEDICATION Calcium one daily   OVER THE COUNTER MEDICATION Iron 65 mg one bid   tirzepatide (MOUNJARO) 5 MG/0.5ML Pen INJECT 5 MG SUBCUTANEOUSLY WEEKLY   VITAMIN D PO Take 1 capsule by mouth daily.                Past Medical History:  Diagnosis Date   Asthma    COPD (chronic obstructive pulmonary disease) (HCC)    Hyperlipidemia    Hypertension    Type 2 diabetes mellitus (HCC)         Objective:    Wts  02/19/2023       206 11/16/2022       200  03/06/2022         206   08/30/21 212 lb (96.2 kg)  07/11/21 209 lb 6.4 oz (95 kg)   06/30/21 210 lb 1.6 oz (95.3 kg)    Vital signs reviewed  02/19/2023  - Note at rest 02 sats  RA% on RA   General appearance:    very hoarse obese wf nad       HEENT : Oropharynx  clear/edentulous     Nasal turbinates mild non specific edema s polyps or cyanosis    NECK :  without  apparent JVD/ palpable Nodes/TM    LUNGS: no acc muscle use,  Nl contour chest which is clear to A and P bilaterally without cough on insp or exp maneuvers   CV:  RRR  no s3 or murmur or increase in P2, and no edema   ABD:  obese soft and nontender    MS:  Nl gait/ ext warm without deformities Or obvious joint restrictions  calf tenderness, cyanosis or clubbing    SKIN: warm and dry without lesions    NEURO:  alert, approp, nl sensorium with  no motor or cerebellar deficits apparent.        I personally reviewed images and agree with radiology impression as follows:   Chest LDSCT   04/25/22  1. Lung-RADS 2, benign appearance or behavior. Continue annual screening with low-dose chest CT without contrast in 12 months. 2. Coronary artery calcification. 3. Enlarged pulmonic trunk, indicative of pulmonary arterial hypertension. 4.  Emphysema (ICD10-J43.9).      Assessment

## 2023-02-19 ENCOUNTER — Encounter: Payer: Self-pay | Admitting: Internal Medicine

## 2023-02-19 ENCOUNTER — Ambulatory Visit (INDEPENDENT_AMBULATORY_CARE_PROVIDER_SITE_OTHER): Payer: 59 | Admitting: Internal Medicine

## 2023-02-19 VITALS — BP 139/72 | HR 87 | Ht 60.0 in | Wt 206.0 lb

## 2023-02-19 DIAGNOSIS — J4489 Other specified chronic obstructive pulmonary disease: Secondary | ICD-10-CM | POA: Diagnosis not present

## 2023-02-19 DIAGNOSIS — R490 Dysphonia: Secondary | ICD-10-CM

## 2023-02-19 DIAGNOSIS — J9611 Chronic respiratory failure with hypoxia: Secondary | ICD-10-CM

## 2023-02-19 NOTE — Patient Instructions (Signed)
For allergies > zyrtec at bedtime will cover 24 hours   Make sure you check your oxygen saturation  AT  your highest level of activity (not after you stop)   to be sure it stays over 90% and adjust  02 flow upward to maintain this level if needed but remember to turn it back to previous settings when you stop (to conserve your supply).    My office will be contacting you by phone for referral to ENT   - if you don't hear back from my office within one week please call us back or notify us thru MyChart and we'll address it right away.   Please schedule a follow up visit in 6 months but call sooner if needed

## 2023-02-20 ENCOUNTER — Encounter: Payer: Self-pay | Admitting: Internal Medicine

## 2023-02-20 NOTE — Assessment & Plan Note (Addendum)
Referred to ENT 03/06/2022 >>> again 11/16/2022 > again 02/19/2023   Strongly advised to keep appt this time and call this office if any problems arise in arranging outpt eval at [facility] clinic and will offer [facility] option (Dr Lynelle Doctor)   Each maintenance medication was reviewed in detail including emphasizing most importantly the difference between maintenance and prns and under what circumstances the prns are to be triggered using an action plan format where appropriate.  Total time for H and P, chart review, counseling, reviewing hfa device(s) and generating customized AVS unique to this office visit / same day charting > 30 min for multiple refractory  respiratory  symptoms of uncertain etiology

## 2023-02-20 NOTE — Assessment & Plan Note (Signed)
sats 97% at rest 08/30/2021   - 03/06/2022 02 sats at rest 96% and using prn hs and daytime  Rec  2lpm hs Make sure you check your oxygen saturation  AT  your highest level of activity (not after you stop)   to be sure it stays over 90% and adjust  02 flow upward to maintain this level if needed but remember to turn it back to previous settings when you stop (to conserve your supply).

## 2023-02-20 NOTE — Assessment & Plan Note (Signed)
Quit smoking 09/2020  - try off acei 06/30/2021  - change advair to symb 80 2bid and max rx for gerd  - alpha one  06/30/21    MM   Level  142 - Allergy profile 06/30/21  >  Eos 0.1 /  IgE  124 -Chest LDSCT   04/25/22  mild centrilobular emphysema/bronchiolitis - 11/16/2022  After extensive coaching inhaler device,  effectiveness =  90%  - PFT's  11/27/22  FEV1 1.97 (82 % ) ratio 0.81  p 20 % improvement from saba p 0 prior to study with DLCO  14.06 (83%)   and FV curve mildly concave and ERV 56% at wt 195    All goals of chronic asthma control met including optimal function and elimination of symptoms with minimal need for rescue therapy.  Contingencies discussed in full including contacting this office immediately if not controlling the symptoms using the rule of two's.   Most concerned about UACS/hoarseness > referred to ENT

## 2023-02-26 ENCOUNTER — Telehealth: Payer: Self-pay | Admitting: Internal Medicine

## 2023-02-26 NOTE — Telephone Encounter (Signed)
Spoke to patient

## 2023-02-26 NOTE — Telephone Encounter (Signed)
Pt called in tirzepatide [facility]) 5 MG/0.5ML Pen  Is on backorder at [facility] [address]  Wants call back

## 2023-03-02 DIAGNOSIS — E1165 Type 2 diabetes mellitus with hyperglycemia: Secondary | ICD-10-CM | POA: Diagnosis not present

## 2023-03-07 DIAGNOSIS — M5432 Sciatica, left side: Secondary | ICD-10-CM | POA: Diagnosis not present

## 2023-03-07 DIAGNOSIS — Z79891 Long term (current) use of opiate analgesic: Secondary | ICD-10-CM | POA: Diagnosis not present

## 2023-03-07 DIAGNOSIS — G8929 Other chronic pain: Secondary | ICD-10-CM | POA: Diagnosis not present

## 2023-03-07 DIAGNOSIS — M545 Low back pain, unspecified: Secondary | ICD-10-CM | POA: Diagnosis not present

## 2023-03-07 DIAGNOSIS — R202 Paresthesia of skin: Secondary | ICD-10-CM | POA: Diagnosis not present

## 2023-03-07 DIAGNOSIS — G894 Chronic pain syndrome: Secondary | ICD-10-CM | POA: Diagnosis not present

## 2023-03-07 DIAGNOSIS — E1142 Type 2 diabetes mellitus with diabetic polyneuropathy: Secondary | ICD-10-CM | POA: Diagnosis not present

## 2023-03-07 DIAGNOSIS — M542 Cervicalgia: Secondary | ICD-10-CM | POA: Diagnosis not present

## 2023-03-07 DIAGNOSIS — M5431 Sciatica, right side: Secondary | ICD-10-CM | POA: Diagnosis not present

## 2023-03-07 DIAGNOSIS — M5136 Other intervertebral disc degeneration, lumbar region: Secondary | ICD-10-CM | POA: Diagnosis not present

## 2023-03-12 ENCOUNTER — Other Ambulatory Visit: Payer: Self-pay | Admitting: Internal Medicine

## 2023-03-12 DIAGNOSIS — I10 Essential (primary) hypertension: Secondary | ICD-10-CM

## 2023-03-14 ENCOUNTER — Ambulatory Visit: Payer: 59 | Admitting: "Endocrinology

## 2023-03-17 ENCOUNTER — Other Ambulatory Visit: Payer: Self-pay | Admitting: "Endocrinology

## 2023-03-17 DIAGNOSIS — I509 Heart failure, unspecified: Secondary | ICD-10-CM | POA: Diagnosis not present

## 2023-03-17 DIAGNOSIS — E119 Type 2 diabetes mellitus without complications: Secondary | ICD-10-CM

## 2023-03-17 DIAGNOSIS — J449 Chronic obstructive pulmonary disease, unspecified: Secondary | ICD-10-CM | POA: Diagnosis not present

## 2023-03-19 ENCOUNTER — Other Ambulatory Visit: Payer: Self-pay | Admitting: Internal Medicine

## 2023-03-19 DIAGNOSIS — I10 Essential (primary) hypertension: Secondary | ICD-10-CM

## 2023-03-22 ENCOUNTER — Ambulatory Visit: Payer: 59 | Admitting: "Endocrinology

## 2023-03-27 ENCOUNTER — Other Ambulatory Visit: Payer: Self-pay | Admitting: "Endocrinology

## 2023-03-27 DIAGNOSIS — Z949 Transplanted organ and tissue status, unspecified: Secondary | ICD-10-CM | POA: Diagnosis not present

## 2023-03-27 DIAGNOSIS — E119 Type 2 diabetes mellitus without complications: Secondary | ICD-10-CM | POA: Diagnosis not present

## 2023-03-28 LAB — LIPID PANEL
Chol/HDL Ratio: 4.9 ratio — ABNORMAL HIGH (ref 0.0–4.4)
Cholesterol, Total: 199 mg/dL (ref 100–199)
HDL: 41 mg/dL (ref >39)
LDL Chol Calc (NIH): 104 mg/dL — ABNORMAL HIGH (ref 0–99)
Triglycerides: 318 mg/dL — ABNORMAL HIGH (ref 0–149)
VLDL Cholesterol Cal: 54 mg/dL — ABNORMAL HIGH (ref 5–40)

## 2023-03-28 LAB — COMPREHENSIVE METABOLIC PANEL
ALT: 14 IU/L (ref 0–32)
AST: 12 IU/L (ref 0–40)
Albumin/Globulin Ratio: 1.4 (ref 1.2–2.2)
Albumin: 3.6 g/dL — ABNORMAL LOW (ref 3.8–4.9)
Alkaline Phosphatase: 123 IU/L — ABNORMAL HIGH (ref 44–121)
BUN/Creatinine Ratio: 21 (ref 9–23)
BUN: 25 mg/dL — ABNORMAL HIGH (ref 6–24)
Bilirubin Total: 0.3 mg/dL (ref 0.0–1.2)
CO2: 22 mmol/L (ref 20–29)
Calcium: 9.9 mg/dL (ref 8.7–10.2)
Chloride: 102 mmol/L (ref 96–106)
Creatinine, Ser: 1.2 mg/dL — ABNORMAL HIGH (ref 0.57–1.00)
Globulin, Total: 2.6 g/dL (ref 1.5–4.5)
Glucose: 236 mg/dL — ABNORMAL HIGH (ref 70–99)
Potassium: 4.4 mmol/L (ref 3.5–5.2)
Sodium: 138 mmol/L (ref 134–144)
Total Protein: 6.2 g/dL (ref 6.0–8.5)
eGFR: 53 mL/min/{1.73_m2} — ABNORMAL LOW (ref >59)

## 2023-04-02 ENCOUNTER — Ambulatory Visit (INDEPENDENT_AMBULATORY_CARE_PROVIDER_SITE_OTHER): Payer: 59 | Admitting: "Endocrinology

## 2023-04-02 ENCOUNTER — Encounter: Payer: Self-pay | Admitting: "Endocrinology

## 2023-04-02 VITALS — BP 130/74 | HR 88 | Ht 60.0 in | Wt 208.0 lb

## 2023-04-02 DIAGNOSIS — E782 Mixed hyperlipidemia: Secondary | ICD-10-CM | POA: Diagnosis not present

## 2023-04-02 DIAGNOSIS — I10 Essential (primary) hypertension: Secondary | ICD-10-CM

## 2023-04-02 DIAGNOSIS — E119 Type 2 diabetes mellitus without complications: Secondary | ICD-10-CM

## 2023-04-02 DIAGNOSIS — Z794 Long term (current) use of insulin: Secondary | ICD-10-CM | POA: Diagnosis not present

## 2023-04-02 LAB — POCT GLYCOSYLATED HEMOGLOBIN (HGB A1C)

## 2023-04-02 MED ORDER — INSULIN LISPRO (1 UNIT DIAL) 100 UNIT/ML (KWIKPEN)
10.0000 [IU] | PEN_INJECTOR | Freq: Three times a day (TID) | SUBCUTANEOUS | 1 refills | Status: DC
Start: 2023-04-02 — End: 2023-07-11

## 2023-04-02 NOTE — Progress Notes (Signed)
04/02/2023, 8:38 PM  Endocrinology follow-up note   Subjective:    Patient ID: Christine Weaver, female    DOB: [birth year].  Christine Weaver is being seen in follow-up after she was seen in consultation for management of currently uncontrolled symptomatic diabetes requested by  Anabel Halon, MD.   Past Medical History:  Diagnosis Date   Asthma    Bell's palsy    COPD (chronic obstructive pulmonary disease) (HCC)    Hyperlipidemia    Hypertension    Lyme disease    Type 2 diabetes mellitus (HCC)     Past Surgical History:  Procedure Laterality Date   CESAREAN SECTION     COLONOSCOPY WITH PROPOFOL N/A 12/16/2021   Procedure: COLONOSCOPY WITH PROPOFOL;  Surgeon: Dolores Frame, MD;  Location: [facility];  Service: Gastroenterology;  Laterality: N/A;  945   HOT HEMOSTASIS  12/16/2021   Procedure: HOT HEMOSTASIS (ARGON PLASMA COAGULATION/BICAP);  Surgeon: Marguerita Merles, Reuel Boom, MD;  Location: [facility];  Service: Gastroenterology;;   POLYPECTOMY  12/16/2021   Procedure: POLYPECTOMY;  Surgeon: Dolores Frame, MD;  Location: [facility];  Service: Gastroenterology;;   RENAL BIOPSY  06/02/2022   SUBMUCOSAL TATTOO INJECTION  12/16/2021   Procedure: SUBMUCOSAL TATTOO INJECTION;  Surgeon: Dolores Frame, MD;  Location: [facility];  Service: Gastroenterology;;    Social History   Socioeconomic History   Marital status: Widowed    Spouse name: Not on file   Number of children: Not on file   Years of education: Not on file   Highest education level: Not on file  Occupational History   Not on file  Tobacco Use   Smoking status: Former    Packs/day: .5    Types: Cigarettes    Quit date: 10/29/2020    Years since quitting: 2.4   Smokeless tobacco: Never  Vaping Use   Vaping Use: Never used  Substance and Sexual Activity   Alcohol use: Never   Drug use: Never   Sexual activity: Not on file  Other Topics  Concern   Not on file  Social History Narrative   Not on file   Social Determinants of Health   Financial Resource Strain: Low Risk  (12/12/2022)   Overall Financial Resource Strain (CARDIA)    Difficulty of Paying Living Expenses: Not hard at all  Food Insecurity: No Food Insecurity (12/12/2022)   Hunger Vital Sign    Worried About Running Out of Food in the Last Year: Never true    Ran Out of Food in the Last Year: Never true  Transportation Needs: No Transportation Needs (12/12/2022)   PRAPARE - Administrator, Civil Service (Medical): No    Lack of Transportation (Non-Medical): No  Physical Activity: Insufficiently Active (12/12/2022)   Exercise Vital Sign    Days of Exercise per Week: 2 days    Minutes of Exercise per Session: 30 min  Stress: No Stress Concern Present (12/12/2022)   [employer] of Occupational Health - Occupational Stress Questionnaire    Feeling of Stress : Not at all  Social Connections: Unknown (12/12/2022)   Social Connection and Isolation Panel [NHANES]    Frequency of Communication with Friends and Family: More than three times a week    Frequency of Social Gatherings with Friends and Family: Patient declined    Attends Religious Services: Patient declined    Database administrator or Organizations: No  Attends Banker Meetings: Never    Marital Status: Widowed    Family History  Problem Relation Age of Onset   Thyroid disease Mother    Hypertension Mother    Heart attack Mother    Stroke Mother    Heart failure Mother    Diabetes Mother    Emphysema Mother    Cancer Mother    Diabetes Father    Heart attack Father    Stroke Father    Kidney disease Father    Heart failure Father     Outpatient Encounter Medications as of 04/02/2023  Medication Sig   tirzepatide (MOUNJARO) 5 MG/0.5ML Pen Inject 5 mg into the skin once a week.   albuterol (VENTOLIN HFA) 108 (90 Base) MCG/ACT inhaler Inhale 2 puffs into the lungs  every 4 (four) hours as needed for wheezing or shortness of breath.   atorvastatin (LIPITOR) 80 MG tablet TAKE 1 TABLET BY MOUTH EVERY DAY   BD PEN NEEDLE NANO 2ND GEN 32G X 4 MM MISC USE 1 PEN NEEDLE FOR 6 INJECTIONS DAILY FOR DIABETES E11.9   budesonide-formoterol (SYMBICORT) 80-4.5 MCG/ACT inhaler TAKE 2 PUFFS FIRST THING IN AM AND THEN ANOTHER 2 PUFFS ABOUT 12 HOURS LATER   Continuous Blood Gluc Receiver (FREESTYLE LIBRE 2 READER) DEVI As directed   diclofenac Sodium (VOLTAREN) 1 % GEL Apply 1 Application topically 4 (four) times daily as needed (pain).   famotidine (PEPCID) 20 MG tablet TAKE 1 TABLET AFTER SUPPER   FARXIGA 5 MG TABS tablet Take 5 mg by mouth daily with breakfast.   Ferrous Sulfate (IRON PO) Take 1 tablet by mouth daily.   fluticasone (FLONASE) 50 MCG/ACT nasal spray Place 1 spray into both nostrils daily as needed for allergies.   furosemide (LASIX) 20 MG tablet TAKE 1 TABLET BY MOUTH TWICE A DAY   glucose blood (ACCU-CHEK AVIVA PLUS) test strip    HYDROcodone-acetaminophen (NORCO/VICODIN) 5-325 MG tablet Take 1 tablet by mouth every 12 (twelve) hours as needed for severe pain.   insulin glargine (LANTUS SOLOSTAR) 100 UNIT/ML Solostar Pen Inject 70 Units into the skin at bedtime.   insulin lispro (HUMALOG KWIKPEN) 100 UNIT/ML KwikPen Inject 10-16 Units into the skin 3 (three) times daily before meals.   irbesartan (AVAPRO) 75 MG tablet TAKE 1 TABLET BY MOUTH EVERY DAY   metFORMIN (GLUCOPHAGE) 500 MG tablet TAKE 1 TABLET BY MOUTH TWICE A DAY WITH FOOD   nystatin cream (MYCOSTATIN) Apply 1 application  topically 2 (two) times daily as needed (rash).   omeprazole (PRILOSEC) 40 MG capsule TAKE 1 CAPSULE (40 MG TOTAL) BY MOUTH DAILY.   OVER THE COUNTER MEDICATION Calcium one daily   OVER THE COUNTER MEDICATION Iron 65 mg one bid   tirzepatide (MOUNJARO) 5 MG/0.5ML Pen INJECT 5 MG SUBCUTANEOUSLY WEEKLY   VITAMIN D PO Take 1 capsule by mouth daily.   [DISCONTINUED] insulin  lispro (HUMALOG KWIKPEN) 100 UNIT/ML KwikPen INJECT 5-11 UNITS INTO THE SKIN 3 (THREE) TIMES DAILY.   No facility-administered encounter medications on file as of 04/02/2023.    ALLERGIES: Allergies  Allergen Reactions   Morphine And Codeine Itching   Other Other (See Comments)    Bells Palsy    Oxycodone-Acetaminophen Other (See Comments)    Constipates patient    Latex Itching and Other (See Comments)    Skin "cracks" open     VACCINATION STATUS: Immunization History  Administered Date(s) Administered   Moderna SARS-COV2 Booster Vaccination 09/03/2021   Moderna  Sars-Covid-2 Vaccination 12/11/2019, 11/13/2020   PNEUMOCOCCAL CONJUGATE-20 10/06/2021   Tdap 03/13/2018   Zoster Recombinat (Shingrix) 10/06/2021, 02/06/2022    Diabetes She presents for her follow-up diabetic visit. She has type 2 diabetes mellitus. Onset time: She was diagnosed at approximately age of 58 years. Her disease course has been worsening. There are no hypoglycemic associated symptoms. Pertinent negatives for hypoglycemia include no confusion, headaches, pallor or seizures. Pertinent negatives for diabetes include no chest pain, no fatigue, no polydipsia, no polyphagia and no polyuria. There are no hypoglycemic complications. Symptoms are worsening. Risk factors for coronary artery disease include dyslipidemia, diabetes mellitus, hypertension, obesity, sedentary lifestyle and tobacco exposure. Current diabetic treatment includes insulin injections. Her weight is increasing steadily. She is following a generally unhealthy diet. When asked about meal planning, she reported none. She has not had a previous visit with a dietitian. She never participates in exercise. Her home blood glucose trend is increasing steadily. Her breakfast blood glucose range is generally >200 mg/dl. Her lunch blood glucose range is generally >200 mg/dl. Her dinner blood glucose range is generally >200 mg/dl. Her bedtime blood glucose range is  generally >200 mg/dl. Her overall blood glucose range is >200 mg/dl. (Christine Weaver presents with loss of control of her glycemia.  Her CGM device AGP shows 34% time in range, 25% level 1 hyperglycemia, 41% level 2 hyperglycemia.  She has no hypoglycemia.  Her average blood glucose is 225 for the last 14 days.  Her point-of-care A1c is 8.6%, increasing from 7.8%.    ) An ACE inhibitor/angiotensin II receptor blocker is being taken. Eye exam is current.  Hyperlipidemia This is a chronic problem. The problem is uncontrolled. Recent lipid tests were reviewed and are high. Exacerbating diseases include diabetes and obesity. Pertinent negatives include no chest pain, myalgias or shortness of breath. Current antihyperlipidemic treatment includes statins. Risk factors for coronary artery disease include dyslipidemia, diabetes mellitus, family history, hypertension, obesity and a sedentary lifestyle.  Hypertension This is a chronic problem. The current episode started more than 1 year ago. The problem is uncontrolled. Pertinent negatives include no chest pain, headaches, palpitations or shortness of breath. Risk factors for coronary artery disease include diabetes mellitus, dyslipidemia, obesity, sedentary lifestyle, smoking/tobacco exposure and family history. Past treatments include ACE inhibitors.   Review of systems: Limited as above.    Objective:       04/02/2023    3:42 PM 02/19/2023    9:45 AM 12/14/2022    1:45 PM  Vitals with BMI  Height 5\' 0"  5\' 0"  5\' 0"   Weight 208 lbs 206 lbs 200 lbs  BMI 40.62 40.23 39.06  Systolic 130 139 960  Diastolic 74 72 73  Pulse 88 87 92    BP 130/74   Pulse 88   Ht 5' (1.524 m)   Wt 208 lb (94.3 kg)   BMI 40.62 kg/m   Wt Readings from Last 3 Encounters:  04/02/23 208 lb (94.3 kg)  02/19/23 206 lb (93.4 kg)  12/14/22 200 lb (90.7 kg)     CMP ( most recent) CMP     Component Value Date/Time   NA 138 03/27/2023 0855   K 4.4 03/27/2023 0855   CL 102  03/27/2023 0855   CO2 22 03/27/2023 0855   GLUCOSE 236 (H) 03/27/2023 0855   GLUCOSE 184 (H) 12/14/2021 0901   BUN 25 (H) 03/27/2023 0855   CREATININE 1.20 (H) 03/27/2023 0855   CALCIUM 9.9 03/27/2023 0855   PROT 6.2 03/27/2023  0855   ALBUMIN 3.6 (L) 03/27/2023 0855   AST 12 03/27/2023 0855   ALT 14 03/27/2023 0855   ALKPHOS 123 (H) 03/27/2023 0855   BILITOT 0.3 03/27/2023 0855   GFRNONAA >60 12/14/2021 0901   GFRAA 80 11/11/2020 0817    Diabetic Labs (most recent): Lab Results  Component Value Date   HGBA1C 7.8 (A) 11/13/2022   HGBA1C 6.7 07/11/2022   HGBA1C 7.4 (A) 03/07/2022     Lipid Panel ( most recent) Lipid Panel     Component Value Date/Time   CHOL 199 03/27/2023 0855   TRIG 318 (H) 03/27/2023 0855   HDL 41 03/27/2023 0855   CHOLHDL 4.9 (H) 03/27/2023 0855   LDLCALC 104 (H) 03/27/2023 0855   LABVLDL 54 (H) 03/27/2023 0855      Lab Results  Component Value Date   TSH 3.110 04/06/2022   TSH 1.210 10/13/2021   TSH 1.730 01/27/2021   TSH 2.640 11/11/2020   TSH 1.42 09/23/2019   FREET4 1.25 04/06/2022   FREET4 1.25 01/27/2021   FREET4 1.28 11/11/2020      Assessment & Plan:   1. Type 2 diabetes mellitus with hyperglycemia, with long-term current use of insulin (HCC)  - Christine Weaver has currently uncontrolled symptomatic type 2 DM since  57 years of age.  Christine Weaver presents with loss of control of her glycemia.  Her CGM device AGP shows 34% time in range, 25% level 1 hyperglycemia, 41% level 2 hyperglycemia.  She has no hypoglycemia.  Her average blood glucose is 225 for the last 14 days.  Her point-of-care A1c is 8.6%, increasing from 7.8%.     Recent labs reviewed.  - I had a long discussion with her about the progressive nature of diabetes and the pathology behind its complications. -her diabetes is complicated by obesity/sedentary life, smoking and she remains at a high risk for more acute and chronic complications which include CAD, CVA, CKD,  retinopathy, and neuropathy. These are all discussed in detail with her.  -  she is advised to stick to a routine mealtimes to eat 3 meals  a day and avoid unnecessary snacks ( to snack only to correct hypoglycemia).  - she acknowledges that there is a room for improvement in her food and drink choices. - Suggestion is made for her to avoid simple carbohydrates  from her diet including Cakes, Sweet Desserts, Ice Cream, Soda (diet and regular), Sweet Tea, Candies, Chips, Cookies, Store Bought Juices, Alcohol in Excess of  1-2 drinks a day, Artificial Sweeteners,  Coffee Creamer, and "Sugar-free" Products, Lemonade. This will help patient to have more stable blood glucose profile and potentially avoid unintended weight gain.  - she will be scheduled with Norm Salt, RDN, CDE for diabetes education.  - I have approached her with the following individualized plan to manage  her diabetes and patient agrees:    She will continue to need intensive treatment with basal/bolus insulin in order for her to maintain control of diabetes to target.    She is advised her to continue Lantus 70 units nightly, advised her to increase Humalog to 10 units  3 times a day with meals  for pre-meal BG readings of 90-150mg /dl, plus patient specific correction dose for unexpected hyperglycemia above 150mg /dl, associated with strict monitoring of glucose 4 times a day-before meals and at bedtime. -He is advised to wear her CGM at all times.  - she is warned not to take insulin without proper monitoring per orders. -  Adjustment parameters are given to her for hypo and hyperglycemia in writing. - she is encouraged to call clinic for blood glucose levels less than 70 or above 200 mg /dl. - she is advised to continue metformin 500 mg p.o. twice daily.  she is advised to continue Farxiga 5 mg p.o. daily at breakfast daily started by her nephrologist.   She was switched to [facility] from Victo[facility].  She is advised to continue  Mounjaro 5 mg subcutaneously weekly.   - Specific targets for  A1c;  LDL, HDL,  and Triglycerides were discussed with the patient.  2) Blood Pressure /Hypertension:   -Her blood pressure is controlled to target.   she is advised to continue her current medications including lisinopril 20 mg p.o. daily with breakfast .  3) Lipids/Hyperlipidemia:   Review of her recent lipid panel showed improved LDL to 104 from 121.  She is advised to continue atorvastatin 80 mg p.o. nightly.    Whole food plant-based diet was discussed and recommended to her.   Side effects and precautions discussed with her.  4)  Weight/Diet: Her BMI is 40.62-  clearly complicating her diabetes care.   she is  a candidate for weight loss. I discussed with her the fact that loss of 5 - 10% of her  current body weight will have the most impact on her diabetes management.  Exercise, and detailed carbohydrates information provided  -  detailed on discharge instructions.  5) Chronic Care/Health Maintenance:  -she  is on ACEI/ARB and Statin medications and  is encouraged to initiate and continue to follow up with Ophthalmology, Dentist,  Podiatrist at least yearly or according to recommendations, and advised to  quit smoking. I have recommended yearly flu vaccine and pneumonia vaccine at least every 5 years; moderate intensity exercise for up to 150 minutes weekly; and  sleep for at least 7 hours a day.  Her recent screening ABI was negative for PAD in February 2022.  Her next study will be due in February 2027 or sooner if needed.   - she is  advised to maintain close follow up with Anabel Halon, MD for primary care needs, as well as her other providers for optimal and coordinated care.   I spent  26  minutes in the care of the patient today including review of labs from CMP, Lipids, Thyroid Function, Hematology (current and previous including abstractions from other facilities); face-to-face time discussing  her blood glucose  readings/logs, discussing hypoglycemia and hyperglycemia episodes and symptoms, medications doses, her options of short and long term treatment based on the latest standards of care / guidelines;  discussion about incorporating lifestyle medicine;  and documenting the encounter. Risk reduction counseling performed per USPSTF guidelines to reduce obesity and cardiovascular risk factors.     Please refer to Patient Instructions for Blood Glucose Monitoring and Insulin/Medications Dosing Guide"  in media tab for additional information. Please  also refer to " Patient Self Inventory" in the Media  tab for reviewed elements of pertinent patient history.  Christine Weaver participated in the discussions, expressed understanding, and voiced agreement with the above plans.  All questions were answered to her satisfaction. she is encouraged to contact clinic should she have any questions or concerns prior to her return visit.      Follow up plan: - No follow-ups on file.  Marquis Lunch, MD [facility] [address] [place], [place] [address] Phone: [phone]  Fax: [phone]  04/02/2023, 8:38 PM  This note was partially dictated with voice recognition software. Similar sounding words can be transcribed inadequately or may not  be corrected upon review.

## 2023-04-02 NOTE — Patient Instructions (Signed)

## 2023-04-04 DIAGNOSIS — M5432 Sciatica, left side: Secondary | ICD-10-CM | POA: Diagnosis not present

## 2023-04-04 DIAGNOSIS — M542 Cervicalgia: Secondary | ICD-10-CM | POA: Diagnosis not present

## 2023-04-04 DIAGNOSIS — E1142 Type 2 diabetes mellitus with diabetic polyneuropathy: Secondary | ICD-10-CM | POA: Diagnosis not present

## 2023-04-04 DIAGNOSIS — G894 Chronic pain syndrome: Secondary | ICD-10-CM | POA: Diagnosis not present

## 2023-04-04 DIAGNOSIS — M5431 Sciatica, right side: Secondary | ICD-10-CM | POA: Diagnosis not present

## 2023-04-04 DIAGNOSIS — Z79891 Long term (current) use of opiate analgesic: Secondary | ICD-10-CM | POA: Diagnosis not present

## 2023-04-04 DIAGNOSIS — M545 Low back pain, unspecified: Secondary | ICD-10-CM | POA: Diagnosis not present

## 2023-04-04 DIAGNOSIS — Z79899 Other long term (current) drug therapy: Secondary | ICD-10-CM | POA: Diagnosis not present

## 2023-04-04 DIAGNOSIS — G8929 Other chronic pain: Secondary | ICD-10-CM | POA: Diagnosis not present

## 2023-04-04 DIAGNOSIS — R202 Paresthesia of skin: Secondary | ICD-10-CM | POA: Diagnosis not present

## 2023-04-04 DIAGNOSIS — M5136 Other intervertebral disc degeneration, lumbar region: Secondary | ICD-10-CM | POA: Diagnosis not present

## 2023-04-06 ENCOUNTER — Other Ambulatory Visit: Payer: Self-pay | Admitting: "Endocrinology

## 2023-04-06 ENCOUNTER — Other Ambulatory Visit: Payer: Self-pay | Admitting: Internal Medicine

## 2023-04-06 DIAGNOSIS — I10 Essential (primary) hypertension: Secondary | ICD-10-CM

## 2023-04-17 DIAGNOSIS — I509 Heart failure, unspecified: Secondary | ICD-10-CM | POA: Diagnosis not present

## 2023-04-17 DIAGNOSIS — J449 Chronic obstructive pulmonary disease, unspecified: Secondary | ICD-10-CM | POA: Diagnosis not present

## 2023-04-23 ENCOUNTER — Other Ambulatory Visit: Payer: Self-pay | Admitting: Internal Medicine

## 2023-04-23 DIAGNOSIS — I10 Essential (primary) hypertension: Secondary | ICD-10-CM

## 2023-04-27 ENCOUNTER — Ambulatory Visit (HOSPITAL_COMMUNITY)
Admission: RE | Admit: 2023-04-27 | Discharge: 2023-04-27 | Disposition: A | Discharge status: Home / Self Care | Payer: 59 | Source: Ambulatory Visit | Attending: Acute Care | Admitting: Acute Care

## 2023-04-27 DIAGNOSIS — Z122 Encounter for screening for malignant neoplasm of respiratory organs: Secondary | ICD-10-CM | POA: Diagnosis not present

## 2023-04-27 DIAGNOSIS — Z87891 Personal history of nicotine dependence: Secondary | ICD-10-CM | POA: Diagnosis not present

## 2023-04-27 DIAGNOSIS — I2510 Atherosclerotic heart disease of native coronary artery without angina pectoris: Secondary | ICD-10-CM | POA: Insufficient documentation

## 2023-04-27 DIAGNOSIS — J439 Emphysema, unspecified: Secondary | ICD-10-CM | POA: Diagnosis not present

## 2023-04-27 DIAGNOSIS — I700 Atherosclerosis of aorta: Secondary | ICD-10-CM | POA: Diagnosis not present

## 2023-05-01 ENCOUNTER — Other Ambulatory Visit: Payer: Self-pay | Admitting: Acute Care

## 2023-05-01 DIAGNOSIS — Z122 Encounter for screening for malignant neoplasm of respiratory organs: Secondary | ICD-10-CM

## 2023-05-01 DIAGNOSIS — Z87891 Personal history of nicotine dependence: Secondary | ICD-10-CM

## 2023-05-02 DIAGNOSIS — M5432 Sciatica, left side: Secondary | ICD-10-CM | POA: Diagnosis not present

## 2023-05-02 DIAGNOSIS — M5431 Sciatica, right side: Secondary | ICD-10-CM | POA: Diagnosis not present

## 2023-05-02 DIAGNOSIS — R202 Paresthesia of skin: Secondary | ICD-10-CM | POA: Diagnosis not present

## 2023-05-02 DIAGNOSIS — Z79891 Long term (current) use of opiate analgesic: Secondary | ICD-10-CM | POA: Diagnosis not present

## 2023-05-02 DIAGNOSIS — G894 Chronic pain syndrome: Secondary | ICD-10-CM | POA: Diagnosis not present

## 2023-05-02 DIAGNOSIS — Z79899 Other long term (current) drug therapy: Secondary | ICD-10-CM | POA: Diagnosis not present

## 2023-05-02 DIAGNOSIS — E1142 Type 2 diabetes mellitus with diabetic polyneuropathy: Secondary | ICD-10-CM | POA: Diagnosis not present

## 2023-05-02 DIAGNOSIS — M5136 Other intervertebral disc degeneration, lumbar region: Secondary | ICD-10-CM | POA: Diagnosis not present

## 2023-05-02 DIAGNOSIS — G8929 Other chronic pain: Secondary | ICD-10-CM | POA: Diagnosis not present

## 2023-05-02 DIAGNOSIS — M542 Cervicalgia: Secondary | ICD-10-CM | POA: Diagnosis not present

## 2023-05-02 DIAGNOSIS — M545 Low back pain, unspecified: Secondary | ICD-10-CM | POA: Diagnosis not present

## 2023-05-08 ENCOUNTER — Other Ambulatory Visit: Payer: Self-pay | Admitting: Internal Medicine

## 2023-05-08 DIAGNOSIS — I10 Essential (primary) hypertension: Secondary | ICD-10-CM

## 2023-05-08 DIAGNOSIS — M5431 Sciatica, right side: Secondary | ICD-10-CM | POA: Diagnosis not present

## 2023-05-08 DIAGNOSIS — R030 Elevated blood-pressure reading, without diagnosis of hypertension: Secondary | ICD-10-CM | POA: Diagnosis not present

## 2023-05-10 DIAGNOSIS — N189 Chronic kidney disease, unspecified: Secondary | ICD-10-CM | POA: Diagnosis not present

## 2023-05-10 DIAGNOSIS — N1831 Chronic kidney disease, stage 3a: Secondary | ICD-10-CM | POA: Diagnosis not present

## 2023-05-10 DIAGNOSIS — R808 Other proteinuria: Secondary | ICD-10-CM | POA: Diagnosis not present

## 2023-05-10 DIAGNOSIS — I10 Essential (primary) hypertension: Secondary | ICD-10-CM | POA: Diagnosis not present

## 2023-05-10 DIAGNOSIS — E1122 Type 2 diabetes mellitus with diabetic chronic kidney disease: Secondary | ICD-10-CM | POA: Diagnosis not present

## 2023-05-14 ENCOUNTER — Ambulatory Visit (INDEPENDENT_AMBULATORY_CARE_PROVIDER_SITE_OTHER): Payer: 59 | Admitting: Internal Medicine

## 2023-05-14 ENCOUNTER — Encounter: Payer: Self-pay | Admitting: Internal Medicine

## 2023-05-14 VITALS — BP 154/82 | HR 85 | Ht 60.0 in | Wt 204.4 lb

## 2023-05-14 DIAGNOSIS — Z794 Long term (current) use of insulin: Secondary | ICD-10-CM | POA: Diagnosis not present

## 2023-05-14 DIAGNOSIS — E1122 Type 2 diabetes mellitus with diabetic chronic kidney disease: Secondary | ICD-10-CM

## 2023-05-14 DIAGNOSIS — N1831 Chronic kidney disease, stage 3a: Secondary | ICD-10-CM | POA: Diagnosis not present

## 2023-05-14 DIAGNOSIS — M5431 Sciatica, right side: Secondary | ICD-10-CM | POA: Diagnosis not present

## 2023-05-14 MED ORDER — CYCLOBENZAPRINE HCL 5 MG PO TABS
5.0000 mg | ORAL_TABLET | Freq: Two times a day (BID) | ORAL | 1 refills | Status: DC | PRN
Start: 2023-05-14 — End: 2023-06-11

## 2023-05-14 NOTE — Patient Instructions (Signed)
Please take Flexeril as needed for back muscle spasms.  Please perform back exercises as provided.

## 2023-05-16 NOTE — Assessment & Plan Note (Signed)
Recently completed oral steroids Would avoid oral steroids due to history of type II DM Started Flexeril as needed for muscle spasms Avoid heavy lifting and frequent bending Simple back exercises advised

## 2023-05-16 NOTE — Progress Notes (Signed)
Acute Office Visit  Subjective:    Patient ID: Christine Weaver, female    DOB: 1966-09-30, 57 y.o.   MRN: [id number]  Chief Complaint  Patient presents with   Sciatica    Sciatica flare up, patient went to UC and has not gotten any relief from muscle relaxer and steroids     HPI Patient is in today for complaint of right-sided low back pain for the last 1 week.  She went to urgent care, and was given oral steroid.  She also received muscle relaxers, but has run out of it.  She still has constant right-sided low back pain, radiating to right buttock area and upper thigh area.  Pain is sharp and is worse with movement.  Patient has slightly improved since taking oral steroid.  Denies any new numbness or tingling of the LE.  Denies any recent fall or injury.  Past Medical History:  Diagnosis Date   Asthma    Bell's palsy    COPD (chronic obstructive pulmonary disease) (HCC)    Hyperlipidemia    Hypertension    Lyme disease    Type 2 diabetes mellitus (HCC)     Past Surgical History:  Procedure Laterality Date   CESAREAN SECTION     COLONOSCOPY WITH PROPOFOL N/A 12/16/2021   Procedure: COLONOSCOPY WITH PROPOFOL;  Surgeon: Dolores Frame, MD;  Location: [facility];  Service: Gastroenterology;  Laterality: N/A;  945   HOT HEMOSTASIS  12/16/2021   Procedure: HOT HEMOSTASIS (ARGON PLASMA COAGULATION/BICAP);  Surgeon: Marguerita Merles, Reuel Boom, MD;  Location: [facility];  Service: Gastroenterology;;   POLYPECTOMY  12/16/2021   Procedure: POLYPECTOMY;  Surgeon: Dolores Frame, MD;  Location: [facility];  Service: Gastroenterology;;   RENAL BIOPSY  06/02/2022   SUBMUCOSAL TATTOO INJECTION  12/16/2021   Procedure: SUBMUCOSAL TATTOO INJECTION;  Surgeon: Dolores Frame, MD;  Location: [facility];  Service: Gastroenterology;;    Family History  Problem Relation Age of Onset   Thyroid disease Mother    Hypertension Mother    Heart attack Mother     Stroke Mother    Heart failure Mother    Diabetes Mother    Emphysema Mother    Cancer Mother    Diabetes Father    Heart attack Father    Stroke Father    Kidney disease Father    Heart failure Father     Social History   Socioeconomic History   Marital status: Widowed    Spouse name: Not on file   Number of children: Not on file   Years of education: Not on file   Highest education level: 12th grade  Occupational History   Not on file  Tobacco Use   Smoking status: Former    Current packs/day: 0.00    Types: Cigarettes    Quit date: 10/29/2020    Years since quitting: 2.5   Smokeless tobacco: Never  Vaping Use   Vaping status: Never Used  Substance and Sexual Activity   Alcohol use: Never   Drug use: Never   Sexual activity: Not on file  Other Topics Concern   Not on file  Social History Narrative   Not on file   Social Determinants of Health   Financial Resource Strain: Low Risk  (05/10/2023)   Overall Financial Resource Strain (CARDIA)    Difficulty of Paying Living Expenses: Not hard at all  Food Insecurity: No Food Insecurity (05/10/2023)   Hunger Vital Sign  Worried About Programme researcher, broadcasting/film/video in the Last Year: Never true    Ran Out of Food in the Last Year: Never true  Transportation Needs: No Transportation Needs (05/10/2023)   PRAPARE - Administrator, Civil Service (Medical): No    Lack of Transportation (Non-Medical): No  Physical Activity: Unknown (05/10/2023)   Exercise Vital Sign    Days of Exercise per Week: Patient declined    Minutes of Exercise per Session: 30 min  Stress: No Stress Concern Present (05/10/2023)   [employer] of Occupational Health - Occupational Stress Questionnaire    Feeling of Stress : Only a little  Social Connections: Unknown (05/10/2023)   Social Connection and Isolation Panel [NHANES]    Frequency of Communication with Friends and Family: Patient declined    Frequency of Social Gatherings with  Friends and Family: Patient declined    Attends Religious Services: Patient declined    Database administrator or Organizations: No    Attends Banker Meetings: Never    Marital Status: Widowed  Intimate Partner Violence: Not At Risk (12/12/2022)   Humiliation, Afraid, Rape, and Kick questionnaire    Fear of Current or Ex-Partner: No    Emotionally Abused: No    Physically Abused: No    Sexually Abused: No    Outpatient Medications Prior to Visit  Medication Sig Dispense Refill   albuterol (VENTOLIN HFA) 108 (90 Base) MCG/ACT inhaler Inhale 2 puffs into the lungs every 4 (four) hours as needed for wheezing or shortness of breath. 18 g 5   atorvastatin (LIPITOR) 80 MG tablet TAKE 1 TABLET BY MOUTH EVERY DAY 90 tablet 1   BD PEN NEEDLE NANO 2ND GEN 32G X 4 MM MISC USE 1 PEN NEEDLE FOR 6 INJECTIONS DAILY FOR DIABETES E11.9 200 each 11   budesonide-formoterol (SYMBICORT) 80-4.5 MCG/ACT inhaler TAKE 2 PUFFS FIRST THING IN AM AND THEN ANOTHER 2 PUFFS ABOUT 12 HOURS LATER 30.6 each 3   Continuous Blood Gluc Receiver (FREESTYLE LIBRE 2 READER) DEVI As directed 1 each 0   diclofenac Sodium (VOLTAREN) 1 % GEL Apply 1 Application topically 4 (four) times daily as needed (pain).     famotidine (PEPCID) 20 MG tablet TAKE 1 TABLET AFTER SUPPER 90 tablet 3   FARXIGA 5 MG TABS tablet Take 5 mg by mouth daily with breakfast.     Ferrous Sulfate (IRON PO) Take 1 tablet by mouth daily.     fluticasone (FLONASE) 50 MCG/ACT nasal spray Place 1 spray into both nostrils daily as needed for allergies.     furosemide (LASIX) 20 MG tablet TAKE 1 TABLET BY MOUTH TWICE A DAY 180 tablet 2   glucose blood (ACCU-CHEK AVIVA PLUS) test strip      HYDROcodone-acetaminophen (NORCO/VICODIN) 5-325 MG tablet Take 1 tablet by mouth every 12 (twelve) hours as needed for severe pain.     insulin glargine (LANTUS SOLOSTAR) 100 UNIT/ML Solostar Pen INJECT 70 UNITS INTO THE SKIN AT BEDTIME. 30 mL 1   insulin lispro  (HUMALOG KWIKPEN) 100 UNIT/ML KwikPen Inject 10-16 Units into the skin 3 (three) times daily before meals. 30 mL 1   irbesartan (AVAPRO) 75 MG tablet TAKE 1 TABLET BY MOUTH EVERY DAY 90 tablet 3   metFORMIN (GLUCOPHAGE) 500 MG tablet TAKE 1 TABLET BY MOUTH TWICE A DAY WITH FOOD 180 tablet 1   nystatin cream (MYCOSTATIN) Apply 1 application  topically 2 (two) times daily as needed (rash).  omeprazole (PRILOSEC) 40 MG capsule TAKE 1 CAPSULE (40 MG TOTAL) BY MOUTH DAILY. 90 capsule 1   OVER THE COUNTER MEDICATION Calcium one daily     OVER THE COUNTER MEDICATION Iron 65 mg one bid     tirzepatide (MOUNJARO) 5 MG/0.5ML Pen INJECT 5 MG SUBCUTANEOUSLY WEEKLY 6 mL 1   VITAMIN D PO Take 1 capsule by mouth daily.     tirzepatide [facility]) 5 MG/0.5ML Pen Inject 5 mg into the skin once a week.     No facility-administered medications prior to visit.    Allergies  Allergen Reactions   Morphine And Codeine Itching   Other Other (See Comments)    Bells Palsy    Oxycodone-Acetaminophen Other (See Comments)    Constipates patient    Latex Itching and Other (See Comments)    Skin "cracks" open     Review of Systems  Constitutional:  Negative for chills and fever.  HENT:  Negative for congestion, sinus pressure, sinus pain and sore throat.   Eyes:  Negative for pain and discharge.  Respiratory:  Negative for cough, shortness of breath and wheezing.   Cardiovascular:  Negative for chest pain and palpitations.  Gastrointestinal:  Negative for abdominal pain, constipation, diarrhea, nausea and vomiting.  Endocrine: Negative for polydipsia and polyuria.  Genitourinary:  Negative for dysuria and hematuria.  Musculoskeletal:  Positive for arthralgias and back pain. Negative for neck pain and neck stiffness.  Skin:  Negative for rash.  Neurological:  Negative for dizziness and weakness.  Psychiatric/Behavioral:  Negative for agitation and behavioral problems.        Objective:    Physical  Exam Vitals reviewed.  Constitutional:      General: She is not in acute distress.    Appearance: She is not diaphoretic.  HENT:     Head: Normocephalic and atraumatic.     Nose: Nose normal.     Mouth/Throat:     Mouth: Mucous membranes are moist.  Eyes:     General: No scleral icterus.    Extraocular Movements: Extraocular movements intact.  Cardiovascular:     Rate and Rhythm: Normal rate and regular rhythm.     Pulses: Normal pulses.     Heart sounds: Normal heart sounds. No murmur heard. Pulmonary:     Breath sounds: Normal breath sounds. No wheezing or rales.  Musculoskeletal:     Cervical back: Neck supple. No tenderness.     Lumbar back: Tenderness present. Decreased range of motion. Positive right straight leg raise test.     Right lower leg: No edema.     Left lower leg: No edema.     Comments: About 0.5 cm cyst over right hand 3rd digit over proximal phalanx  Skin:    General: Skin is warm.     Findings: No rash.  Neurological:     General: No focal deficit present.     Mental Status: She is alert and oriented to person, place, and time.     Sensory: No sensory deficit.     Motor: No weakness.  Psychiatric:        Mood and Affect: Mood normal.        Behavior: Behavior normal.     BP (!) 154/82 (BP Location: Left Arm, Patient Position: Sitting, Cuff Size: Normal)   Pulse 85   Ht 5' (1.524 m)   Wt 204 lb 6.4 oz (92.7 kg)   SpO2 94%   BMI 39.92 kg/m  Wt Readings from  Last 3 Encounters:  05/14/23 204 lb 6.4 oz (92.7 kg)  04/27/23 207 lb 14.3 oz (94.3 kg)  04/02/23 208 lb (94.3 kg)        Assessment & Plan:   Problem List Items Addressed This Visit       Endocrine   Type 2 diabetes mellitus with stage 3a chronic kidney disease, with long-term current use of insulin (HCC)    Lab Results  Component Value Date   HGBA1C 7.8 (A) 11/13/2022   Uncontrolled Takes Basaglar 70 U qHS, Humalog 14-20 U TID according to sliding scale On Mounjaro for better  glycemic profile Metformin 500 mg BID On SGLT2i as she has HFpEF and CKD Follows up with Dr Fransico Him On statin and ARB Referred to Ophthalmology for diabetic eye exam  Avoid oral NSAIDs (CKD) or steroids        Nervous and Auditory   Sciatica of right side - Primary    Recently completed oral steroids Would avoid oral steroids due to history of type II DM Started Flexeril as needed for muscle spasms Avoid heavy lifting and frequent bending Simple back exercises advised      Relevant Medications   cyclobenzaprine (FLEXERIL) 5 MG tablet     Meds ordered this encounter  Medications   cyclobenzaprine (FLEXERIL) 5 MG tablet    Sig: Take 1 tablet (5 mg total) by mouth 2 (two) times daily as needed for muscle spasms.    Dispense:  30 tablet    Refill:  1     Shanikia Kernodle Concha Se, MD

## 2023-05-16 NOTE — Assessment & Plan Note (Signed)
Lab Results  Component Value Date   HGBA1C 7.8 (A) 11/13/2022   Uncontrolled Takes Basaglar 70 U qHS, Humalog 14-20 U TID according to sliding scale On Mounjaro for better glycemic profile Metformin 500 mg BID On SGLT2i as she has HFpEF and CKD Follows up with Dr Fransico Him On statin and ARB Referred to Ophthalmology for diabetic eye exam  Avoid oral NSAIDs (CKD) or steroids

## 2023-05-17 DIAGNOSIS — I509 Heart failure, unspecified: Secondary | ICD-10-CM | POA: Diagnosis not present

## 2023-05-17 DIAGNOSIS — J449 Chronic obstructive pulmonary disease, unspecified: Secondary | ICD-10-CM | POA: Diagnosis not present

## 2023-05-21 DIAGNOSIS — N1831 Chronic kidney disease, stage 3a: Secondary | ICD-10-CM | POA: Diagnosis not present

## 2023-05-21 DIAGNOSIS — R808 Other proteinuria: Secondary | ICD-10-CM | POA: Diagnosis not present

## 2023-05-21 DIAGNOSIS — N189 Chronic kidney disease, unspecified: Secondary | ICD-10-CM | POA: Diagnosis not present

## 2023-05-21 DIAGNOSIS — I10 Essential (primary) hypertension: Secondary | ICD-10-CM | POA: Diagnosis not present

## 2023-05-21 DIAGNOSIS — E1122 Type 2 diabetes mellitus with diabetic chronic kidney disease: Secondary | ICD-10-CM | POA: Diagnosis not present

## 2023-05-24 DIAGNOSIS — N1831 Chronic kidney disease, stage 3a: Secondary | ICD-10-CM | POA: Diagnosis not present

## 2023-05-24 DIAGNOSIS — I5032 Chronic diastolic (congestive) heart failure: Secondary | ICD-10-CM | POA: Diagnosis not present

## 2023-05-24 DIAGNOSIS — E1122 Type 2 diabetes mellitus with diabetic chronic kidney disease: Secondary | ICD-10-CM | POA: Diagnosis not present

## 2023-05-30 ENCOUNTER — Telehealth: Payer: Self-pay | Admitting: Cardiology

## 2023-05-30 DIAGNOSIS — M5431 Sciatica, right side: Secondary | ICD-10-CM | POA: Diagnosis not present

## 2023-05-30 DIAGNOSIS — R202 Paresthesia of skin: Secondary | ICD-10-CM | POA: Diagnosis not present

## 2023-05-30 DIAGNOSIS — M5432 Sciatica, left side: Secondary | ICD-10-CM | POA: Diagnosis not present

## 2023-05-30 DIAGNOSIS — G894 Chronic pain syndrome: Secondary | ICD-10-CM | POA: Diagnosis not present

## 2023-05-30 DIAGNOSIS — M542 Cervicalgia: Secondary | ICD-10-CM | POA: Diagnosis not present

## 2023-05-30 DIAGNOSIS — G8929 Other chronic pain: Secondary | ICD-10-CM | POA: Diagnosis not present

## 2023-05-30 DIAGNOSIS — E1142 Type 2 diabetes mellitus with diabetic polyneuropathy: Secondary | ICD-10-CM | POA: Diagnosis not present

## 2023-05-30 DIAGNOSIS — Z79891 Long term (current) use of opiate analgesic: Secondary | ICD-10-CM | POA: Diagnosis not present

## 2023-05-30 DIAGNOSIS — M545 Low back pain, unspecified: Secondary | ICD-10-CM | POA: Diagnosis not present

## 2023-05-30 DIAGNOSIS — Z79899 Other long term (current) drug therapy: Secondary | ICD-10-CM | POA: Diagnosis not present

## 2023-05-30 DIAGNOSIS — M5136 Other intervertebral disc degeneration, lumbar region: Secondary | ICD-10-CM | POA: Diagnosis not present

## 2023-05-30 NOTE — Telephone Encounter (Signed)
Pt c/o medication issue:  1. Name of Medication: zFurosemide  2. How are you currently taking this medication (dosage and times per day)?   3. Are you having a reaction (difficulty breathing--STAT)?   4. What is your medication issue? Patient saidd her kidney doctor wants her to take 1 pill instead of 2- she wants to know what Dr Diona Browner wants her to do?

## 2023-05-31 NOTE — Telephone Encounter (Signed)
Per DPR, I left message for patient that she should follow the instructions from nephrology.

## 2023-06-01 ENCOUNTER — Other Ambulatory Visit: Payer: Self-pay | Admitting: Cardiology

## 2023-06-01 ENCOUNTER — Other Ambulatory Visit: Payer: Self-pay | Admitting: Internal Medicine

## 2023-06-01 DIAGNOSIS — E1165 Type 2 diabetes mellitus with hyperglycemia: Secondary | ICD-10-CM | POA: Diagnosis not present

## 2023-06-01 DIAGNOSIS — I10 Essential (primary) hypertension: Secondary | ICD-10-CM

## 2023-06-09 ENCOUNTER — Other Ambulatory Visit: Payer: Self-pay | Admitting: Internal Medicine

## 2023-06-09 ENCOUNTER — Other Ambulatory Visit: Payer: Self-pay | Admitting: "Endocrinology

## 2023-06-09 DIAGNOSIS — M5431 Sciatica, right side: Secondary | ICD-10-CM

## 2023-06-09 DIAGNOSIS — Z794 Long term (current) use of insulin: Secondary | ICD-10-CM

## 2023-06-17 DIAGNOSIS — I509 Heart failure, unspecified: Secondary | ICD-10-CM | POA: Diagnosis not present

## 2023-06-17 DIAGNOSIS — J449 Chronic obstructive pulmonary disease, unspecified: Secondary | ICD-10-CM | POA: Diagnosis not present

## 2023-06-21 ENCOUNTER — Ambulatory Visit (INDEPENDENT_AMBULATORY_CARE_PROVIDER_SITE_OTHER): Payer: 59 | Admitting: Internal Medicine

## 2023-06-21 ENCOUNTER — Encounter: Payer: Self-pay | Admitting: Internal Medicine

## 2023-06-21 VITALS — BP 138/77 | HR 85 | Ht 60.0 in | Wt 202.8 lb

## 2023-06-21 DIAGNOSIS — J0100 Acute maxillary sinusitis, unspecified: Secondary | ICD-10-CM | POA: Diagnosis not present

## 2023-06-21 DIAGNOSIS — Z794 Long term (current) use of insulin: Secondary | ICD-10-CM | POA: Diagnosis not present

## 2023-06-21 DIAGNOSIS — I5032 Chronic diastolic (congestive) heart failure: Secondary | ICD-10-CM

## 2023-06-21 DIAGNOSIS — J4489 Other specified chronic obstructive pulmonary disease: Secondary | ICD-10-CM | POA: Diagnosis not present

## 2023-06-21 DIAGNOSIS — N1831 Chronic kidney disease, stage 3a: Secondary | ICD-10-CM

## 2023-06-21 DIAGNOSIS — I10 Essential (primary) hypertension: Secondary | ICD-10-CM

## 2023-06-21 DIAGNOSIS — E1122 Type 2 diabetes mellitus with diabetic chronic kidney disease: Secondary | ICD-10-CM | POA: Diagnosis not present

## 2023-06-21 DIAGNOSIS — E782 Mixed hyperlipidemia: Secondary | ICD-10-CM

## 2023-06-21 LAB — HEMOGLOBIN A1C: Hemoglobin A1C: 7.7

## 2023-06-21 MED ORDER — AZITHROMYCIN 250 MG PO TABS
ORAL_TABLET | ORAL | 0 refills | Status: AC
Start: 2023-06-21 — End: 2023-06-26

## 2023-06-21 MED ORDER — TIRZEPATIDE 7.5 MG/0.5ML ~~LOC~~ SOAJ
7.5000 mg | SUBCUTANEOUS | 0 refills | Status: DC
Start: 2023-08-24 — End: 2023-07-24

## 2023-06-21 NOTE — Assessment & Plan Note (Signed)
On Lipitor Check lipid profile 

## 2023-06-21 NOTE — Progress Notes (Signed)
Established Patient Office Visit  Subjective:  Patient ID: Christine Weaver, female    DOB: September 01, 1966  Age: 57 y.o. MRN: [id number]  CC:  Chief Complaint  Patient presents with   Diabetes    6 month follow up    Chronic Kidney Disease    6 month follow    Nasal Congestion    Has been congested for 1 week. Taking Day-quil and Night-quil.     HPI Christine Weaver is a 57 y.o. female with past medical history of COPD, chronic respiratory failure, HTN, HFpEF and type 2 DM who presents for f/u of her chronic medical conditions.  HTN: BP is well-controlled. She is not sure if she is taking Irbesartan. Patient denies headache, dizziness, chest pain, dyspnea or palpitations.   HFpEF: She denies orthopnea or PND. Takes Lasix 20 mg QD regularly.  COPD: On home O2 - 2 lpm PRN now, she is benefiting from oxygen usage during episodes of dyspnea/hypoxia. Uses Symbicort regularly. Had to use Albuterol BID recently. Denies dyspnea or wheezing currently.  Type II DM with HLD: Her HbA1C has slightly increased to 7.7 today.  She has been taking Basaglar 70 units with Humalog ISS.  She has been tolerating Mounjaro 5 mg qw.  She has been trying to cut down soft drinks, but admits that she has sweets when her kids cook for her.  She is trying to cut down on bread products.  CKD: Followed by Dr Wolfgang Phoenix.  She has nephrotic range proteinuria, and has been placed on [place] every other day and [place]. She denies any dysuria, hematuria, or urinary hesitancy or resistance. She recently had renal biopsy, which showed diabetic nephropathy.  She reports nasal congestion, sinus pressure related headache and postnasal drip for the last 1 week.  She has been taking DayQuil/NyQuil with some relief.  Denies any fever or chills.  She has worsening of cough and dyspnea for the last 1 week as well.  Her home COVID test was negative.  Past Medical History:  Diagnosis Date   Asthma    Bell's palsy    COPD (chronic  obstructive pulmonary disease) (HCC)    Hyperlipidemia    Hypertension    Lyme disease    Type 2 diabetes mellitus (HCC)     Past Surgical History:  Procedure Laterality Date   CESAREAN SECTION     COLONOSCOPY WITH PROPOFOL N/A 12/16/2021   Procedure: COLONOSCOPY WITH PROPOFOL;  Surgeon: Dolores Frame, MD;  Location: [facility];  Service: Gastroenterology;  Laterality: N/A;  945   HOT HEMOSTASIS  12/16/2021   Procedure: HOT HEMOSTASIS (ARGON PLASMA COAGULATION/BICAP);  Surgeon: Marguerita Merles, Reuel Boom, MD;  Location: [facility];  Service: Gastroenterology;;   POLYPECTOMY  12/16/2021   Procedure: POLYPECTOMY;  Surgeon: Dolores Frame, MD;  Location: [facility];  Service: Gastroenterology;;   RENAL BIOPSY  06/02/2022   SUBMUCOSAL TATTOO INJECTION  12/16/2021   Procedure: SUBMUCOSAL TATTOO INJECTION;  Surgeon: Dolores Frame, MD;  Location: [facility];  Service: Gastroenterology;;    Family History  Problem Relation Age of Onset   Thyroid disease Mother    Hypertension Mother    Heart attack Mother    Stroke Mother    Heart failure Mother    Diabetes Mother    Emphysema Mother    Cancer Mother    Diabetes Father    Heart attack Father    Stroke Father    Kidney disease Father    Heart  failure Father     Social History   Socioeconomic History   Marital status: Widowed    Spouse name: Not on file   Number of children: Not on file   Years of education: Not on file   Highest education level: 12th grade  Occupational History   Not on file  Tobacco Use   Smoking status: Former    Current packs/day: 0.00    Types: Cigarettes    Quit date: 10/29/2020    Years since quitting: 2.6   Smokeless tobacco: Never  Vaping Use   Vaping status: Never Used  Substance and Sexual Activity   Alcohol use: Never   Drug use: Never   Sexual activity: Not on file  Other Topics Concern   Not on file  Social History Narrative   Not on  file   Social Determinants of Health   Financial Resource Strain: Low Risk  (05/10/2023)   Overall Financial Resource Strain (CARDIA)    Difficulty of Paying Living Expenses: Not hard at all  Food Insecurity: No Food Insecurity (05/10/2023)   Hunger Vital Sign    Worried About Running Out of Food in the Last Year: Never true    Ran Out of Food in the Last Year: Never true  Transportation Needs: No Transportation Needs (05/10/2023)   PRAPARE - Administrator, Civil Service (Medical): No    Lack of Transportation (Non-Medical): No  Physical Activity: Unknown (05/10/2023)   Exercise Vital Sign    Days of Exercise per Week: Patient declined    Minutes of Exercise per Session: 30 min  Stress: No Stress Concern Present (05/10/2023)   [employer] of Occupational Health - Occupational Stress Questionnaire    Feeling of Stress : Only a little  Social Connections: Unknown (05/10/2023)   Social Connection and Isolation Panel [NHANES]    Frequency of Communication with Friends and Family: Patient declined    Frequency of Social Gatherings with Friends and Family: Patient declined    Attends Religious Services: Patient declined    Database administrator or Organizations: No    Attends Banker Meetings: Never    Marital Status: Widowed  Intimate Partner Violence: Not At Risk (12/12/2022)   Humiliation, Afraid, Rape, and Kick questionnaire    Fear of Current or Ex-Partner: No    Emotionally Abused: No    Physically Abused: No    Sexually Abused: No    Outpatient Medications Prior to Visit  Medication Sig Dispense Refill   albuterol (VENTOLIN HFA) 108 (90 Base) MCG/ACT inhaler Inhale 2 puffs into the lungs every 4 (four) hours as needed for wheezing or shortness of breath. 18 g 5   atorvastatin (LIPITOR) 80 MG tablet TAKE 1 TABLET BY MOUTH EVERY DAY 90 tablet 1   BD PEN NEEDLE NANO 2ND GEN 32G X 4 MM MISC USE 1 PEN NEEDLE FOR 6 INJECTIONS DAILY FOR DIABETES E11.9 200  each 11   budesonide-formoterol (SYMBICORT) 80-4.5 MCG/ACT inhaler TAKE 2 PUFFS FIRST THING IN AM AND THEN ANOTHER 2 PUFFS ABOUT 12 HOURS LATER 30.6 each 3   Continuous Blood Gluc Receiver (FREESTYLE LIBRE 2 READER) DEVI As directed 1 each 0   cyclobenzaprine (FLEXERIL) 5 MG tablet TAKE 1 TABLET BY MOUTH 2 TIMES DAILY AS NEEDED FOR MUSCLE SPASMS. 30 tablet 1   diclofenac Sodium (VOLTAREN) 1 % GEL Apply 1 Application topically 4 (four) times daily as needed (pain).     famotidine (PEPCID) 20 MG tablet TAKE  1 TABLET AFTER SUPPER 90 tablet 3   FARXIGA 5 MG TABS tablet Take 5 mg by mouth daily with breakfast.     Ferrous Sulfate (IRON PO) Take 1 tablet by mouth daily.     fluticasone (FLONASE) 50 MCG/ACT nasal spray Place 1 spray into both nostrils daily as needed for allergies.     furosemide (LASIX) 20 MG tablet Take 1 tablet (20 mg total) by mouth daily. 90 tablet 2   glucose blood (ACCU-CHEK AVIVA PLUS) test strip      HYDROcodone-acetaminophen (NORCO/VICODIN) 5-325 MG tablet Take 1 tablet by mouth every 12 (twelve) hours as needed for severe pain.     insulin glargine (LANTUS SOLOSTAR) 100 UNIT/ML Solostar Pen INJECT 70 UNITS INTO THE SKIN AT BEDTIME. 30 mL 1   insulin lispro (HUMALOG KWIKPEN) 100 UNIT/ML KwikPen Inject 10-16 Units into the skin 3 (three) times daily before meals. 30 mL 1   irbesartan (AVAPRO) 75 MG tablet TAKE 1 TABLET BY MOUTH EVERY DAY 90 tablet 3   metFORMIN (GLUCOPHAGE) 500 MG tablet TAKE 1 TABLET BY MOUTH TWICE A DAY WITH FOOD 180 tablet 0   nystatin cream (MYCOSTATIN) Apply 1 application  topically 2 (two) times daily as needed (rash).     omeprazole (PRILOSEC) 40 MG capsule TAKE 1 CAPSULE (40 MG TOTAL) BY MOUTH DAILY. 90 capsule 1   OVER THE COUNTER MEDICATION Calcium one daily     OVER THE COUNTER MEDICATION Iron 65 mg one bid     VITAMIN D PO Take 1 capsule by mouth daily.     tirzepatide (MOUNJARO) 5 MG/0.5ML Pen INJECT 5 MG SUBCUTANEOUSLY WEEKLY 6 mL 1   No  facility-administered medications prior to visit.    Allergies  Allergen Reactions   Morphine And Codeine Itching   Other Other (See Comments)    Bells Palsy    Oxycodone-Acetaminophen Other (See Comments)    Constipates patient    Latex Itching and Other (See Comments)    Skin "cracks" open     ROS Review of Systems  Constitutional:  Negative for chills and fever.  HENT:  Positive for congestion, postnasal drip, sinus pressure and sinus pain.   Eyes:  Negative for pain and discharge.  Respiratory:  Positive for cough and shortness of breath.   Cardiovascular:  Negative for chest pain and palpitations.  Gastrointestinal:  Negative for abdominal pain, constipation, diarrhea, nausea and vomiting.  Endocrine: Negative for polydipsia and polyuria.  Genitourinary:  Negative for dysuria and hematuria.  Musculoskeletal:  Positive for arthralgias and back pain. Negative for neck pain and neck stiffness.  Skin:  Negative for rash.  Neurological:  Negative for dizziness and weakness.  Psychiatric/Behavioral:  Negative for agitation and behavioral problems.       Objective:    Physical Exam Vitals reviewed.  Constitutional:      General: She is not in acute distress.    Appearance: She is not diaphoretic.  HENT:     Head: Normocephalic and atraumatic.     Nose: Congestion present.     Right Sinus: Maxillary sinus tenderness present.     Left Sinus: Maxillary sinus tenderness present.     Mouth/Throat:     Mouth: Mucous membranes are moist.  Eyes:     General: No scleral icterus.    Extraocular Movements: Extraocular movements intact.  Cardiovascular:     Rate and Rhythm: Normal rate and regular rhythm.     Heart sounds: Normal heart sounds. No murmur heard.  Pulmonary:     Breath sounds: Normal breath sounds. No wheezing or rales.  Musculoskeletal:     Cervical back: Neck supple. No tenderness.     Right lower leg: No edema.     Left lower leg: No edema.     Comments:  About 0.5 cm cyst over right hand 3rd digit over proximal phalanx  Skin:    General: Skin is warm.     Findings: No rash.  Neurological:     General: No focal deficit present.     Mental Status: She is alert and oriented to person, place, and time.     Sensory: No sensory deficit.     Motor: No weakness.  Psychiatric:        Mood and Affect: Mood normal.        Behavior: Behavior normal.     BP 138/77 (BP Location: Left Arm, Patient Position: Sitting, Cuff Size: Normal)   Pulse 85   Ht 5' (1.524 m)   Wt 202 lb 12.8 oz (92 kg)   SpO2 96%   BMI 39.61 kg/m  Wt Readings from Last 3 Encounters:  06/21/23 202 lb 12.8 oz (92 kg)  05/14/23 204 lb 6.4 oz (92.7 kg)  04/27/23 207 lb 14.3 oz (94.3 kg)    Lab Results  Component Value Date   TSH 3.110 04/06/2022   Lab Results  Component Value Date   WBC 9.5 06/02/2022   HGB 12.2 06/02/2022   HCT 39.2 06/02/2022   MCV 76.7 (L) 06/02/2022   PLT 241 06/02/2022   Lab Results  Component Value Date   NA 138 03/27/2023   K 4.4 03/27/2023   CO2 22 03/27/2023   GLUCOSE 236 (H) 03/27/2023   BUN 25 (H) 03/27/2023   CREATININE 1.20 (H) 03/27/2023   BILITOT 0.3 03/27/2023   ALKPHOS 123 (H) 03/27/2023   AST 12 03/27/2023   ALT 14 03/27/2023   PROT 6.2 03/27/2023   ALBUMIN 3.6 (L) 03/27/2023   CALCIUM 9.9 03/27/2023   ANIONGAP 10 12/14/2021   EGFR 53 (L) 03/27/2023   Lab Results  Component Value Date   CHOL 199 03/27/2023   Lab Results  Component Value Date   HDL 41 03/27/2023   Lab Results  Component Value Date   LDLCALC 104 (H) 03/27/2023   Lab Results  Component Value Date   TRIG 318 (H) 03/27/2023   Lab Results  Component Value Date   CHOLHDL 4.9 (H) 03/27/2023   Lab Results  Component Value Date   HGBA1C 7.7 06/21/2023      Assessment & Plan:   Problem List Items Addressed This Visit       Cardiovascular and Mediastinum   Essential hypertension, benign    BP Readings from Last 1 Encounters:   06/21/23 138/77   Well-controlled with Irbesartan now, but needs to take it regularly for proteinuria Counseled for compliance with the medications Advised DASH diet and moderate exercise/walking, at least 150 mins/week      (HFpEF) heart failure with preserved ejection fraction (HCC)    Appears euvolemic Continue Lasix On Farxiga Follows up with Cardiologist        Respiratory   Asthmatic bronchitis , chronic    Usually well-controlled, current worsening likely due to acute sinusitis On Symbicort Uses Albuterol nebs and inhaler PRN On home O2 - 2 lpm PRN      Acute non-recurrent maxillary sinusitis    Started empiric azithromycin considering her persistent symptoms despite symptomatic treatment Continue  DayQuil/NyQuil for nasal congestion Flonase for allergies      Relevant Medications   azithromycin (ZITHROMAX) 250 MG tablet     Endocrine   Type 2 diabetes mellitus with stage 3a chronic kidney disease, with long-term current use of insulin (HCC) - Primary    Lab Results  Component Value Date   HGBA1C 7.8 (A) 11/13/2022   HbA1c: 7.7 today Uncontrolled, but improving Takes Basaglar 70 U qHS, Humalog 10-16 U TID according to sliding scale On Mounjaro for better glycemic profile - plan to increase dose to 7.5 mg qw in 10/24 when she completes the 5 mg doses Metformin 500 mg BID On SGLT2i as she has HFpEF and CKD Follows up with Dr Fransico Him On statin and ARB Referred to Ophthalmology for diabetic eye exam  Avoid oral NSAIDs (CKD) or steroids      Relevant Medications   tirzepatide (MOUNJARO) 7.5 MG/0.5ML Pen (Start on 08/24/2023)   Other Relevant Orders   CMP14+EGFR   Urine Microalbumin w/creat. ratio   Bayer DCA Hb A1c Waived     Genitourinary   Stage 3a chronic kidney disease (HCC)    Followed by Dr Wolfgang Phoenix Last BMP reviewed On Farxiga and [place] for proteinuria Needs to take Irbesartan        Other   Mixed hyperlipidemia    On Lipitor Check lipid  profile      Relevant Orders   Lipid Profile    Meds ordered this encounter  Medications   azithromycin (ZITHROMAX) 250 MG tablet    Sig: Take 2 tablets on day 1, then 1 tablet daily on days 2 through 5    Dispense:  6 tablet    Refill:  0   tirzepatide (MOUNJARO) 7.5 MG/0.5ML Pen    Sig: Inject 7.5 mg into the skin once a week.    Dispense:  2 mL    Refill:  0    Dose change - Please do not send refill request of 5 mg.    Follow-up: Return in about 4 months (around 10/21/2023) for HTN and COPD.    Anabel Halon, MD

## 2023-06-21 NOTE — Assessment & Plan Note (Addendum)
Followed by Dr Theador Hawthorne Last BMP reviewed On Farxiga and Asa Lente for proteinuria Needs to take Irbesartan

## 2023-06-21 NOTE — Assessment & Plan Note (Signed)
Appears euvolemic Continue Lasix On Farxiga Follows up with Cardiologist

## 2023-06-21 NOTE — Assessment & Plan Note (Signed)
BP Readings from Last 1 Encounters:  06/21/23 138/77   Well-controlled with Irbesartan now, but needs to take it regularly for proteinuria Counseled for compliance with the medications Advised DASH diet and moderate exercise/walking, at least 150 mins/week

## 2023-06-21 NOTE — Assessment & Plan Note (Signed)
Started empiric azithromycin considering her persistent symptoms despite symptomatic treatment Continue DayQuil/NyQuil for nasal congestion Flonase for allergies

## 2023-06-21 NOTE — Assessment & Plan Note (Addendum)
Usually well-controlled, current worsening likely due to acute sinusitis On Symbicort Uses Albuterol nebs and inhaler PRN On home O2 - 2 lpm PRN

## 2023-06-21 NOTE — Patient Instructions (Addendum)
Please start taking Azithromycin as prescribed. Okay to take Dayquil/Nyquil as needed for nasal congestion.  Please continue taking Irbesartan as prescribed.  Please start Mounjaro 7.5 mg once weekly as prescribed once you complete 5 mg dose.  Please continue to take other medications as prescribed.  Please continue to follow low carb diet and perform moderate exercise/walking as tolerated.  Please get fasting blood tests done before the next visit.

## 2023-06-21 NOTE — Assessment & Plan Note (Addendum)
Lab Results  Component Value Date   HGBA1C 7.8 (A) 11/13/2022   HbA1c: 7.7 today Uncontrolled, but improving Takes Basaglar 70 U qHS, Humalog 10-16 U TID according to sliding scale On Mounjaro for better glycemic profile - plan to increase dose to 7.5 mg qw in 10/24 when she completes the 5 mg doses Metformin 500 mg BID On SGLT2i as she has HFpEF and CKD Follows up with Dr Fransico Him On statin and ARB Referred to Ophthalmology for diabetic eye exam  Avoid oral NSAIDs (CKD) or steroids

## 2023-06-27 DIAGNOSIS — G894 Chronic pain syndrome: Secondary | ICD-10-CM | POA: Diagnosis not present

## 2023-06-27 DIAGNOSIS — M545 Low back pain, unspecified: Secondary | ICD-10-CM | POA: Diagnosis not present

## 2023-06-27 DIAGNOSIS — G8929 Other chronic pain: Secondary | ICD-10-CM | POA: Diagnosis not present

## 2023-06-27 DIAGNOSIS — E1142 Type 2 diabetes mellitus with diabetic polyneuropathy: Secondary | ICD-10-CM | POA: Diagnosis not present

## 2023-06-27 DIAGNOSIS — M5431 Sciatica, right side: Secondary | ICD-10-CM | POA: Diagnosis not present

## 2023-06-27 DIAGNOSIS — R202 Paresthesia of skin: Secondary | ICD-10-CM | POA: Diagnosis not present

## 2023-06-27 DIAGNOSIS — M542 Cervicalgia: Secondary | ICD-10-CM | POA: Diagnosis not present

## 2023-06-27 DIAGNOSIS — M5432 Sciatica, left side: Secondary | ICD-10-CM | POA: Diagnosis not present

## 2023-06-27 DIAGNOSIS — M5136 Other intervertebral disc degeneration, lumbar region: Secondary | ICD-10-CM | POA: Diagnosis not present

## 2023-06-27 DIAGNOSIS — Z79891 Long term (current) use of opiate analgesic: Secondary | ICD-10-CM | POA: Diagnosis not present

## 2023-07-09 ENCOUNTER — Other Ambulatory Visit: Payer: Self-pay | Admitting: "Endocrinology

## 2023-07-11 ENCOUNTER — Other Ambulatory Visit: Payer: Self-pay | Admitting: "Endocrinology

## 2023-07-11 ENCOUNTER — Encounter: Payer: Self-pay | Admitting: "Endocrinology

## 2023-07-11 ENCOUNTER — Ambulatory Visit (INDEPENDENT_AMBULATORY_CARE_PROVIDER_SITE_OTHER): Payer: 59 | Admitting: "Endocrinology

## 2023-07-11 VITALS — BP 124/68 | HR 84 | Ht 60.0 in | Wt 204.2 lb

## 2023-07-11 DIAGNOSIS — I10 Essential (primary) hypertension: Secondary | ICD-10-CM | POA: Diagnosis not present

## 2023-07-11 DIAGNOSIS — Z794 Long term (current) use of insulin: Secondary | ICD-10-CM | POA: Diagnosis not present

## 2023-07-11 DIAGNOSIS — E119 Type 2 diabetes mellitus without complications: Secondary | ICD-10-CM | POA: Insufficient documentation

## 2023-07-11 DIAGNOSIS — E782 Mixed hyperlipidemia: Secondary | ICD-10-CM

## 2023-07-11 LAB — POCT GLYCOSYLATED HEMOGLOBIN (HGB A1C): HbA1c, POC (controlled diabetic range): 7.9 % — AB (ref 0.0–7.0)

## 2023-07-11 MED ORDER — EMPAGLIFLOZIN 10 MG PO TABS: 10.0000 mg | ORAL_TABLET | Freq: Every day | ORAL | 1 refills | Status: DC

## 2023-07-11 MED ORDER — DAPAGLIFLOZIN PROPANEDIOL 10 MG PO TABS
10.0000 mg | ORAL_TABLET | Freq: Every day | ORAL | 1 refills | Status: DC
Start: 2023-07-11 — End: 2023-07-11

## 2023-07-11 MED ORDER — LANTUS SOLOSTAR 100 UNIT/ML ~~LOC~~ SOPN: 80.0000 [IU] | PEN_INJECTOR | Freq: Every day | SUBCUTANEOUS | 1 refills | Status: DC

## 2023-07-11 NOTE — Patient Instructions (Signed)

## 2023-07-11 NOTE — Progress Notes (Signed)
07/11/2023, 2:05 PM  Endocrinology follow-up note   Subjective:    Patient ID: Christine Weaver, female    DOB: Sep 20, 1966.  Christine Weaver is being seen in follow-up after she was seen in consultation for management of currently uncontrolled symptomatic diabetes requested by  Anabel Halon, MD.   Past Medical History:  Diagnosis Date   Asthma    Bell's palsy    COPD (chronic obstructive pulmonary disease) (HCC)    Hyperlipidemia    Hypertension    Lyme disease    Type 2 diabetes mellitus (HCC)     Past Surgical History:  Procedure Laterality Date   CESAREAN SECTION     COLONOSCOPY WITH PROPOFOL N/A 12/16/2021   Procedure: COLONOSCOPY WITH PROPOFOL;  Surgeon: Dolores Frame, MD;  Location: [facility];  Service: Gastroenterology;  Laterality: N/A;  945   HOT HEMOSTASIS  12/16/2021   Procedure: HOT HEMOSTASIS (ARGON PLASMA COAGULATION/BICAP);  Surgeon: Marguerita Merles, Reuel Boom, MD;  Location: [facility];  Service: Gastroenterology;;   POLYPECTOMY  12/16/2021   Procedure: POLYPECTOMY;  Surgeon: Dolores Frame, MD;  Location: [facility];  Service: Gastroenterology;;   RENAL BIOPSY  06/02/2022   SUBMUCOSAL TATTOO INJECTION  12/16/2021   Procedure: SUBMUCOSAL TATTOO INJECTION;  Surgeon: Dolores Frame, MD;  Location: [facility];  Service: Gastroenterology;;    Social History   Socioeconomic History   Marital status: Widowed    Spouse name: Not on file   Number of children: Not on file   Years of education: Not on file   Highest education level: 12th grade  Occupational History   Not on file  Tobacco Use   Smoking status: Former    Current packs/day: 0.00    Types: Cigarettes    Quit date: 10/29/2020    Years since quitting: 2.6   Smokeless tobacco: Never  Vaping Use   Vaping status: Never Used  Substance and Sexual Activity   Alcohol use: Never   Drug use: Never   Sexual activity: Not on file   Other Topics Concern   Not on file  Social History Narrative   Not on file   Social Determinants of Health   Financial Resource Strain: Low Risk  (05/10/2023)   Overall Financial Resource Strain (CARDIA)    Difficulty of Paying Living Expenses: Not hard at all  Food Insecurity: No Food Insecurity (05/10/2023)   Hunger Vital Sign    Worried About Running Out of Food in the Last Year: Never true    Ran Out of Food in the Last Year: Never true  Transportation Needs: No Transportation Needs (05/10/2023)   PRAPARE - Administrator, Civil Service (Medical): No    Lack of Transportation (Non-Medical): No  Physical Activity: Unknown (05/10/2023)   Exercise Vital Sign    Days of Exercise per Week: Patient declined    Minutes of Exercise per Session: 30 min  Stress: No Stress Concern Present (05/10/2023)   [employer] of Occupational Health - Occupational Stress Questionnaire    Feeling of Stress : Only a little  Social Connections: Unknown (05/10/2023)   Social Connection and Isolation Panel [NHANES]    Frequency of Communication with Friends and Family: Patient declined    Frequency of Social Gatherings with Friends and Family: Patient declined    Attends Religious Services: Patient declined    Database administrator or Organizations: No    Attends Banker  Meetings: Never    Marital Status: Widowed    Family History  Problem Relation Age of Onset   Thyroid disease Mother    Hypertension Mother    Heart attack Mother    Stroke Mother    Heart failure Mother    Diabetes Mother    Emphysema Mother    Cancer Mother    Diabetes Father    Heart attack Father    Stroke Father    Kidney disease Father    Heart failure Father     Outpatient Encounter Medications as of 07/11/2023  Medication Sig   dapagliflozin propanediol (FARXIGA) 10 MG TABS tablet Take 1 tablet (10 mg total) by mouth daily before breakfast.   albuterol (VENTOLIN HFA) 108 (90 Base)  MCG/ACT inhaler Inhale 2 puffs into the lungs every 4 (four) hours as needed for wheezing or shortness of breath.   atorvastatin (LIPITOR) 80 MG tablet TAKE 1 TABLET BY MOUTH EVERY DAY   BD PEN NEEDLE NANO 2ND GEN 32G X 4 MM MISC USE 1 PEN NEEDLE FOR 6 INJECTIONS DAILY FOR DIABETES E11.9   budesonide-formoterol (SYMBICORT) 80-4.5 MCG/ACT inhaler TAKE 2 PUFFS FIRST THING IN AM AND THEN ANOTHER 2 PUFFS ABOUT 12 HOURS LATER   Continuous Blood Gluc Receiver (FREESTYLE LIBRE 2 READER) DEVI As directed   cyclobenzaprine (FLEXERIL) 5 MG tablet TAKE 1 TABLET BY MOUTH 2 TIMES DAILY AS NEEDED FOR MUSCLE SPASMS.   diclofenac Sodium (VOLTAREN) 1 % GEL Apply 1 Application topically 4 (four) times daily as needed (pain).   famotidine (PEPCID) 20 MG tablet TAKE 1 TABLET AFTER SUPPER   Ferrous Sulfate (IRON PO) Take 1 tablet by mouth daily.   fluticasone (FLONASE) 50 MCG/ACT nasal spray Place 1 spray into both nostrils daily as needed for allergies.   furosemide (LASIX) 20 MG tablet Take 1 tablet (20 mg total) by mouth daily.   glucose blood (ACCU-CHEK AVIVA PLUS) test strip    HYDROcodone-acetaminophen (NORCO/VICODIN) 5-325 MG tablet Take 1 tablet by mouth every 12 (twelve) hours as needed for severe pain.   insulin glargine (LANTUS SOLOSTAR) 100 UNIT/ML Solostar Pen Inject 80 Units into the skin at bedtime.   irbesartan (AVAPRO) 75 MG tablet TAKE 1 TABLET BY MOUTH EVERY DAY   metFORMIN (GLUCOPHAGE) 500 MG tablet TAKE 1 TABLET BY MOUTH TWICE A DAY WITH FOOD   nystatin cream (MYCOSTATIN) Apply 1 application  topically 2 (two) times daily as needed (rash).   omeprazole (PRILOSEC) 40 MG capsule TAKE 1 CAPSULE (40 MG TOTAL) BY MOUTH DAILY.   OVER THE COUNTER MEDICATION Calcium one daily   OVER THE COUNTER MEDICATION Iron 65 mg one bid   [START ON 08/24/2023] tirzepatide (MOUNJARO) 7.5 MG/0.5ML Pen Inject 7.5 mg into the skin once a week.   VITAMIN D PO Take 1 capsule by mouth daily.   [DISCONTINUED] FARXIGA 5 MG  TABS tablet Take 5 mg by mouth daily with breakfast.   [DISCONTINUED] insulin glargine (LANTUS SOLOSTAR) 100 UNIT/ML Solostar Pen INJECT 70 UNITS INTO THE SKIN AT BEDTIME.   [DISCONTINUED] insulin lispro (HUMALOG KWIKPEN) 100 UNIT/ML KwikPen Inject 10-16 Units into the skin 3 (three) times daily before meals.   No facility-administered encounter medications on file as of 07/11/2023.    ALLERGIES: Allergies  Allergen Reactions   Morphine And Codeine Itching   Other Other (See Comments)    Bells Palsy    Oxycodone-Acetaminophen Other (See Comments)    Constipates patient    Latex Itching and Other (See  Comments)    Skin "cracks" open     VACCINATION STATUS: Immunization History  Administered Date(s) Administered   Moderna SARS-COV2 Booster Vaccination 09/03/2021   Moderna Sars-Covid-2 Vaccination 12/11/2019, 11/13/2020   PNEUMOCOCCAL CONJUGATE-20 10/06/2021   Tdap 03/13/2018   Zoster Recombinant(Shingrix) 10/06/2021, 02/06/2022    Diabetes She presents for her follow-up diabetic visit. She has type 2 diabetes mellitus. Onset time: She was diagnosed at approximately age of 21 years. Her disease course has been improving. There are no hypoglycemic associated symptoms. Pertinent negatives for hypoglycemia include no confusion, headaches, pallor or seizures. Pertinent negatives for diabetes include no chest pain, no fatigue, no polydipsia, no polyphagia and no polyuria. There are no hypoglycemic complications. Symptoms are improving. Risk factors for coronary artery disease include dyslipidemia, diabetes mellitus, hypertension, obesity, sedentary lifestyle and tobacco exposure. Current diabetic treatment includes insulin injections. Her weight is increasing steadily. She is following a generally unhealthy diet. When asked about meal planning, she reported none. She has not had a previous visit with a dietitian. She never participates in exercise. Her home blood glucose trend is decreasing  steadily. Her breakfast blood glucose range is generally 140-180 mg/dl. Her lunch blood glucose range is generally 140-180 mg/dl. Her dinner blood glucose range is generally 140-180 mg/dl. Her bedtime blood glucose range is generally 140-180 mg/dl. Her overall blood glucose range is 140-180 mg/dl. (Christine Weaver presents with improved glycemic profile.  Her CGM AGP report shows 66% time range, 29% level 1 hyperglycemia, 5% level 2 hyperglycemia.  Her average blood glucose 171 and point-of-care A1c is 7.9%, progressively improving.     ) An ACE inhibitor/angiotensin II receptor blocker is being taken. Eye exam is current.  Hyperlipidemia This is a chronic problem. The problem is uncontrolled. Recent lipid tests were reviewed and are high. Exacerbating diseases include diabetes and obesity. Pertinent negatives include no chest pain, myalgias or shortness of breath. Current antihyperlipidemic treatment includes statins. Risk factors for coronary artery disease include dyslipidemia, diabetes mellitus, family history, hypertension, obesity and a sedentary lifestyle.  Hypertension This is a chronic problem. The current episode started more than 1 year ago. The problem is uncontrolled. Pertinent negatives include no chest pain, headaches, palpitations or shortness of breath. Risk factors for coronary artery disease include diabetes mellitus, dyslipidemia, obesity, sedentary lifestyle, smoking/tobacco exposure and family history. Past treatments include ACE inhibitors.   Review of systems: Limited as above.    Objective:       07/11/2023   10:16 AM 06/21/2023    9:55 AM 05/14/2023   10:10 AM  Vitals with BMI  Height 5\' 0"  5\' 0"  5\' 0"   Weight 204 lbs 3 oz 202 lbs 13 oz 204 lbs 6 oz  BMI 39.88 39.61 39.92  Systolic 124 138 782  Diastolic 68 77 82  Pulse 84 85 85    BP 124/68   Pulse 84   Ht 5' (1.524 m)   Wt 204 lb 3.2 oz (92.6 kg)   BMI 39.88 kg/m   Wt Readings from Last 3 Encounters:  07/11/23 204  lb 3.2 oz (92.6 kg)  06/21/23 202 lb 12.8 oz (92 kg)  05/14/23 204 lb 6.4 oz (92.7 kg)     CMP ( most recent) CMP     Component Value Date/Time   NA 138 03/27/2023 0855   K 4.4 03/27/2023 0855   CL 102 03/27/2023 0855   CO2 22 03/27/2023 0855   GLUCOSE 236 (H) 03/27/2023 0855   GLUCOSE 184 (H) 12/14/2021 0901  BUN 25 (H) 03/27/2023 0855   CREATININE 1.20 (H) 03/27/2023 0855   CALCIUM 9.9 03/27/2023 0855   PROT 6.2 03/27/2023 0855   ALBUMIN 3.6 (L) 03/27/2023 0855   AST 12 03/27/2023 0855   ALT 14 03/27/2023 0855   ALKPHOS 123 (H) 03/27/2023 0855   BILITOT 0.3 03/27/2023 0855   GFRNONAA >60 12/14/2021 0901   GFRAA 80 11/11/2020 0817    Diabetic Labs (most recent): Lab Results  Component Value Date   HGBA1C 7.9 (A) 07/11/2023   HGBA1C 7.7 06/21/2023   HGBA1C 7.8 (A) 11/13/2022     Lipid Panel ( most recent) Lipid Panel     Component Value Date/Time   CHOL 199 03/27/2023 0855   TRIG 318 (H) 03/27/2023 0855   HDL 41 03/27/2023 0855   CHOLHDL 4.9 (H) 03/27/2023 0855   LDLCALC 104 (H) 03/27/2023 0855   LABVLDL 54 (H) 03/27/2023 0855      Lab Results  Component Value Date   TSH 3.110 04/06/2022   TSH 1.210 10/13/2021   TSH 1.730 01/27/2021   TSH 2.640 11/11/2020   TSH 1.42 09/23/2019   FREET4 1.25 04/06/2022   FREET4 1.25 01/27/2021   FREET4 1.28 11/11/2020      Assessment & Plan:   1. Type 2 diabetes mellitus with hyperglycemia, with long-term current use of insulin (HCC)  - Christine Weaver has currently uncontrolled symptomatic type 2 DM since  57 years of age.  Christine Weaver presents with improved glycemic profile.  Her CGM AGP report shows 66% time range, 29% level 1 hyperglycemia, 5% level 2 hyperglycemia.  Her average blood glucose 171 and point-of-care A1c is 7.9%, progressively improving.     Recent labs reviewed.  - I had a long discussion with her about the progressive nature of diabetes and the pathology behind its complications. -her diabetes is  complicated by obesity/sedentary life, smoking and she remains at a high risk for more acute and chronic complications which include CAD, CVA, CKD, retinopathy, and neuropathy. These are all discussed in detail with her.  -  she is advised to stick to a routine mealtimes to eat 3 meals  a day and avoid unnecessary snacks ( to snack only to correct hypoglycemia).  - she acknowledges that there is a room for improvement in her food and drink choices. - Suggestion is made for her to avoid simple carbohydrates  from her diet including Cakes, Sweet Desserts, Ice Cream, Soda (diet and regular), Sweet Tea, Candies, Chips, Cookies, Store Bought Juices, Alcohol in Excess of  1-2 drinks a day, Artificial Sweeteners,  Coffee Creamer, and "Sugar-free" Products, Lemonade. This will help patient to have more stable blood glucose profile and potentially avoid unintended weight gain.  Change  - she will be scheduled with Norm Salt, RDN, CDE for diabetes education.  - I have approached her with the following individualized plan to manage  her diabetes and patient agrees:    She will continue to need multiple modality treatments in order for her to maintain control of diabetes to target.    -She would benefit from simplified regimen.  Accordingly, I advised her to increase her Lantus to 80 units nightly, hold Humalog for now.   -She is advised to continue to use her CGM continuously.   - she is warned not to take insulin without proper monitoring per orders.  - she is encouraged to call clinic for blood glucose levels less than 70 or above 200 mg /dl. - she is advised to  continue metformin 500 mg p.o. twice daily.   -I discussed and increased her Marcelline Deist to 10 mg p.o. daily at breakfast.   -She has tolerated Mounjaro 5 mg and it was already adjusted to 7.5 mg subcutaneously weekly.  Side effects and precautions discussed with her.     - Specific targets for  A1c;  LDL, HDL,  and Triglycerides were  discussed with the patient.  2) Blood Pressure /Hypertension:   -Her blood pressure was controlled to target.   she is advised to continue her current medications including lisinopril 20 mg p.o. daily with breakfast .  3) Lipids/Hyperlipidemia:   Review of her recent lipid panel showed improved LDL to 104 from 121.  She is advised to continue atorvastatin 80 mg p.o. nightly.     Whole food plant-based diet was discussed and recommended to her.   Side effects and precautions discussed with her.  4)  Weight/Diet: Her BMI is 39.88--  clearly complicating her diabetes care.   she is  a candidate for weight loss. I discussed with her the fact that loss of 5 - 10% of her  current body weight will have the most impact on her diabetes management.  Exercise, and detailed carbohydrates information provided  -  detailed on discharge instructions.  5) Chronic Care/Health Maintenance:  -she  is on ACEI/ARB and Statin medications and  is encouraged to initiate and continue to follow up with Ophthalmology, Dentist,  Podiatrist at least yearly or according to recommendations, and advised to  quit smoking. I have recommended yearly flu vaccine and pneumonia vaccine at least every 5 years; moderate intensity exercise for up to 150 minutes weekly; and  sleep for at least 7 hours a day.  Her recent screening ABI was negative for PAD in February 2022.  Her next study will be due in February 2027 or sooner if needed.   - she is  advised to maintain close follow up with Anabel Halon, MD for primary care needs, as well as her other providers for optimal and coordinated care.   I spent  41  minutes in the care of the patient today including review of labs from CMP, Lipids, Thyroid Function, Hematology (current and previous including abstractions from other facilities); face-to-face time discussing  her blood glucose readings/logs, discussing hypoglycemia and hyperglycemia episodes and symptoms, medications doses,  her options of short and long term treatment based on the latest standards of care / guidelines;  discussion about incorporating lifestyle medicine;  and documenting the encounter. Risk reduction counseling performed per USPSTF guidelines to reduce  obesity and cardiovascular risk factors.     Please refer to Patient Instructions for Blood Glucose Monitoring and Insulin/Medications Dosing Guide"  in media tab for additional information. Please  also refer to " Patient Self Inventory" in the Media  tab for reviewed elements of pertinent patient history.  Christine Weaver participated in the discussions, expressed understanding, and voiced agreement with the above plans.  All questions were answered to her satisfaction. she is encouraged to contact clinic should she have any questions or concerns prior to her return visit.    Follow up plan: - Return in about 4 months (around 11/10/2023) for F/U with Pre-visit Labs, Meter/CGM/Logs, A1c here.  Marquis Lunch, MD [facility] [address] [place], [place] [address] Phone: [phone]  Fax: [phone]    07/11/2023, 2:05 PM  This note was partially dictated with voice recognition software. Similar sounding words can  be transcribed inadequately or may not  be corrected upon review.

## 2023-07-12 ENCOUNTER — Telehealth: Payer: Self-pay | Admitting: Internal Medicine

## 2023-07-12 NOTE — Telephone Encounter (Signed)
Patient goes to [facility] in [place] for eye exams

## 2023-07-12 NOTE — Telephone Encounter (Signed)
Requested eye exam

## 2023-07-18 DIAGNOSIS — I509 Heart failure, unspecified: Secondary | ICD-10-CM | POA: Diagnosis not present

## 2023-07-18 DIAGNOSIS — J449 Chronic obstructive pulmonary disease, unspecified: Secondary | ICD-10-CM | POA: Diagnosis not present

## 2023-07-24 ENCOUNTER — Other Ambulatory Visit: Payer: Self-pay | Admitting: Internal Medicine

## 2023-07-24 DIAGNOSIS — I10 Essential (primary) hypertension: Secondary | ICD-10-CM

## 2023-07-24 DIAGNOSIS — M5431 Sciatica, right side: Secondary | ICD-10-CM

## 2023-07-24 DIAGNOSIS — Z794 Long term (current) use of insulin: Secondary | ICD-10-CM

## 2023-07-25 ENCOUNTER — Ambulatory Visit (INDEPENDENT_AMBULATORY_CARE_PROVIDER_SITE_OTHER): Payer: 59

## 2023-07-25 VITALS — Ht 60.0 in | Wt 202.0 lb

## 2023-07-25 DIAGNOSIS — Z0000 Encounter for general adult medical examination without abnormal findings: Secondary | ICD-10-CM | POA: Diagnosis not present

## 2023-07-25 DIAGNOSIS — Z1231 Encounter for screening mammogram for malignant neoplasm of breast: Secondary | ICD-10-CM

## 2023-07-25 NOTE — Patient Instructions (Signed)
Christine Weaver , Thank you for taking time to come for your Medicare Wellness Visit. I appreciate your ongoing commitment to your health goals. Please review the following plan we discussed and let me know if I can assist you in the future.   Referrals/Orders/Follow-Ups/Clinician Recommendations:  Next Medicare Annual Wellness Visit: October 29, 2024 at 9:20am virtual visit.   You have an order for:  []   2D Mammogram  [x]   3D Mammogram  []   Bone Density   []   Lung Cancer Screening  Please call for appointment:   [facility] at [facility] [address] [place], [place] [address] [phone]  Make sure to wear two-piece clothing.  No lotions powders or deodorants the day of the appointment Make sure to bring picture ID and insurance card.  Bring list of medications you are currently taking including any supplements.   Schedule your [place] screening mammogram through MyChart!   Log into your MyChart account.  Go to 'Visit' (or 'Appointments' if on mobile App) --> Schedule an Appointment  Under 'Select a Reason for Visit' choose the Mammogram Screening option.  Complete the pre-visit questions and select the time and place that best fits your schedule.    This is a list of the screening recommended for you and due dates:  Health Maintenance  Topic Date Due   COVID-19 Vaccine (3 - Moderna risk series) 10/01/2021   Eye exam for diabetics  05/31/2022   Yearly kidney health urinalysis for diabetes  02/07/2023   Flu Shot  Never done   Mammogram  06/21/2023   Medicare Annual Wellness Visit  07/18/2023   Hemoglobin A1C  01/08/2024   Yearly kidney function blood test for diabetes  03/26/2024   Complete foot exam   06/20/2024   Colon Cancer Screening  12/16/2024   Pap with HPV screening  07/11/2026   DTaP/Tdap/Td vaccine (2 - Td or Tdap) 03/13/2028   Hepatitis C Screening  Completed   HIV Screening  Completed   Zoster (Shingles) Vaccine  Completed    HPV Vaccine  Aged Out    Advanced directives: (Declined) Advance directive discussed with you today. Even though you declined this today, please call our office should you change your mind, and we can give you the proper paperwork for you to fill out.  Next Medicare Annual Wellness Visit scheduled for next year: Yes  Preventive Care 14-15 Years Old, Female Preventive care refers to lifestyle choices and visits with your health care provider that can promote health and wellness. Preventive care visits are also called wellness exams. What can I expect for my preventive care visit? Counseling Your health care provider may ask you questions about your: Medical history, including: Past medical problems. Family medical history. Pregnancy history. Current health, including: Menstrual cycle. Method of birth control. Emotional well-being. Home life and relationship well-being. Sexual activity and sexual health. Lifestyle, including: Alcohol, nicotine or tobacco, and drug use. Access to firearms. Diet, exercise, and sleep habits. Work and work Astronomer. Sunscreen use. Safety issues such as seatbelt and bike helmet use. Physical exam Your health care provider will check your: Height and weight. These may be used to calculate your BMI (body mass index). BMI is a measurement that tells if you are at a healthy weight. Waist circumference. This measures the distance around your waistline. This measurement also tells if you are at a healthy weight and may help predict your risk of certain diseases, such as type 2 diabetes and high  blood pressure. Heart rate and blood pressure. Body temperature. Skin for abnormal spots. What immunizations do I need?  Vaccines are usually given at various ages, according to a schedule. Your health care provider will recommend vaccines for you based on your age, medical history, and lifestyle or other factors, such as travel or where you work. What tests do I  need? Screening Your health care provider may recommend screening tests for certain conditions. This may include: Lipid and cholesterol levels. Diabetes screening. This is done by checking your blood sugar (glucose) after you have not eaten for a while (fasting). Pelvic exam and Pap test. Hepatitis B test. Hepatitis C test. HIV (human immunodeficiency virus) test. STI (sexually transmitted infection) testing, if you are at risk. Lung cancer screening. Colorectal cancer screening. Mammogram. Talk with your health care provider about when you should start having regular mammograms. This may depend on whether you have a family history of breast cancer. BRCA-related cancer screening. This may be done if you have a family history of breast, ovarian, tubal, or peritoneal cancers. Bone density scan. This is done to screen for osteoporosis. Talk with your health care provider about your test results, treatment options, and if necessary, the need for more tests. Follow these instructions at home: Eating and drinking  Eat a diet that includes fresh fruits and vegetables, whole grains, lean protein, and low-fat dairy products. Take vitamin and mineral supplements as recommended by your health care provider. Do not drink alcohol if: Your health care provider tells you not to drink. You are pregnant, may be pregnant, or are planning to become pregnant. If you drink alcohol: Limit how much you have to 0-1 drink a day. Know how much alcohol is in your drink. In the [place] one drink equals one 12 oz bottle of beer (355 mL), one 5 oz glass of wine (148 mL), or one 1 oz glass of hard liquor (44 mL). Lifestyle Brush your teeth every morning and night with fluoride toothpaste. Floss one time each day. Exercise for at least 30 minutes 5 or more days each week. Do not use any products that contain nicotine or tobacco. These products include cigarettes, chewing tobacco, and vaping devices, such as  e-cigarettes. If you need help quitting, ask your health care provider. Do not use drugs. If you are sexually active, practice safe sex. Use a condom or other form of protection to prevent STIs. If you do not wish to become pregnant, use a form of birth control. If you plan to become pregnant, see your health care provider for a prepregnancy visit. Take aspirin only as told by your health care provider. Make sure that you understand how much to take and what form to take. Work with your health care provider to find out whether it is safe and beneficial for you to take aspirin daily. Find healthy ways to manage stress, such as: Meditation, yoga, or listening to music. Journaling. Talking to a trusted person. Spending time with friends and family. Minimize exposure to UV radiation to reduce your risk of skin cancer. Safety Always wear your seat belt while driving or riding in a vehicle. Do not drive: If you have been drinking alcohol. Do not ride with someone who has been drinking. When you are tired or distracted. While texting. If you have been using any mind-altering substances or drugs. Wear a helmet and other protective equipment during sports activities. If you have firearms in your house, make sure you follow all gun safety procedures.  Seek help if you have been physically or sexually abused. What's next? Visit your health care provider once a year for an annual wellness visit. Ask your health care provider how often you should have your eyes and teeth checked. Stay up to date on all vaccines. This information is not intended to replace advice given to you by your health care provider. Make sure you discuss any questions you have with your health care provider. Document Revised: 04/13/2021 Document Reviewed: 04/13/2021 Elsevier Patient Education  2024 [employer].

## 2023-07-25 NOTE — Progress Notes (Signed)
 Because this visit was a virtual/telehealth visit,  certain criteria was not obtained, such a blood pressure, CBG if applicable, and timed get up and go. Any medications not marked as "taking" were not mentioned during the medication reconciliation part of the visit. Any vitals not documented were not able to be obtained due to this being a telehealth visit or patient was unable to self-report a recent blood pressure reading due to a lack of equipment at home via telehealth. Vitals that have been documented are verbally provided by the patient.   Subjective:   Christine Weaver is a 57 y.o. female who presents for Medicare Annual (Subsequent) preventive examination.  Visit Complete: Virtual  I connected with  Christine Weaver on 07/25/23 by a audio enabled telemedicine application and verified that I am speaking with the correct person using two identifiers.  Patient Location: Home  Provider Location: Home Office  I discussed the limitations of evaluation and management by telemedicine. The patient expressed understanding and agreed to proceed.  Patient Medicare AWV questionnaire was completed by the patient on na; I have confirmed that all information answered by patient is correct and no changes since this date.  Cardiac Risk Factors include: advanced age (>24men, >45 women);diabetes mellitus;dyslipidemia;hypertension;obesity (BMI >30kg/m2)     Objective:    Today's Vitals   07/25/23 1401 07/25/23 1409  Weight: 202 lb (91.6 kg)   Height: 5' (1.524 m)   PainSc:  8    Body mass index is 39.45 kg/m.     07/25/2023    2:01 PM 07/17/2022    8:49 AM 12/16/2021    7:52 AM 07/15/2021    2:10 PM 12/10/2020    4:45 PM 12/09/2020    9:50 AM 06/19/2018   10:24 AM  Advanced Directives  Does Patient Have a Medical Advance Directive? No No No No No No No  Would patient like information on creating a medical advance directive? No - Patient declined No - Patient declined No - Patient declined         Current Medications (verified) Outpatient Encounter Medications as of 07/25/2023  Medication Sig   albuterol (VENTOLIN HFA) 108 (90 Base) MCG/ACT inhaler Inhale 2 puffs into the lungs every 4 (four) hours as needed for wheezing or shortness of breath.   atorvastatin (LIPITOR) 80 MG tablet TAKE 1 TABLET BY MOUTH EVERY DAY   BD PEN NEEDLE NANO 2ND GEN 32G X 4 MM MISC USE 1 PEN NEEDLE FOR 6 INJECTIONS DAILY FOR DIABETES E11.9   budesonide-formoterol (SYMBICORT) 80-4.5 MCG/ACT inhaler TAKE 2 PUFFS FIRST THING IN AM AND THEN ANOTHER 2 PUFFS ABOUT 12 HOURS LATER   Continuous Blood Gluc Receiver (FREESTYLE LIBRE 2 READER) DEVI As directed   cyclobenzaprine (FLEXERIL) 5 MG tablet TAKE 1 TABLET BY MOUTH 2 TIMES DAILY AS NEEDED FOR MUSCLE SPASMS.   diclofenac Sodium (VOLTAREN) 1 % GEL Apply 1 Application topically 4 (four) times daily as needed (pain).   empagliflozin (JARDIANCE) 10 MG TABS tablet Take 1 tablet (10 mg total) by mouth daily before breakfast.   famotidine (PEPCID) 20 MG tablet TAKE 1 TABLET AFTER SUPPER   Ferrous Sulfate (IRON PO) Take 1 tablet by mouth daily.   fluticasone (FLONASE) 50 MCG/ACT nasal spray Place 1 spray into both nostrils daily as needed for allergies.   furosemide (LASIX) 20 MG tablet Take 1 tablet (20 mg total) by mouth daily.   glucose blood (ACCU-CHEK AVIVA PLUS) test strip    HYDROcodone-acetaminophen (NORCO/VICODIN) 5-325 MG tablet Take 1  tablet by mouth every 12 (twelve) hours as needed for severe pain.   insulin glargine (LANTUS SOLOSTAR) 100 UNIT/ML Solostar Pen Inject 80 Units into the skin at bedtime.   irbesartan (AVAPRO) 75 MG tablet TAKE 1 TABLET BY MOUTH EVERY DAY   metFORMIN (GLUCOPHAGE) 500 MG tablet TAKE 1 TABLET BY MOUTH TWICE A DAY WITH FOOD   nystatin cream (MYCOSTATIN) Apply 1 application  topically 2 (two) times daily as needed (rash).   omeprazole (PRILOSEC) 40 MG capsule TAKE 1 CAPSULE (40 MG TOTAL) BY MOUTH DAILY.   OVER THE COUNTER  MEDICATION Calcium one daily   OVER THE COUNTER MEDICATION Iron 65 mg one bid   tirzepatide (MOUNJARO) 7.5 MG/0.5ML Pen INJECT 7.5 MG SUBCUTANEOUSLY WEEKLY   VITAMIN D PO Take 1 capsule by mouth daily.   No facility-administered encounter medications on file as of 07/25/2023.    Allergies (verified) Morphine and codeine, Other, Oxycodone-acetaminophen, and Latex   History: Past Medical History:  Diagnosis Date   Asthma    Bell's palsy    COPD (chronic obstructive pulmonary disease) (HCC)    Hyperlipidemia    Hypertension    Lyme disease    Type 2 diabetes mellitus (HCC)    Past Surgical History:  Procedure Laterality Date   CESAREAN SECTION     COLONOSCOPY WITH PROPOFOL N/A 12/16/2021   Procedure: COLONOSCOPY WITH PROPOFOL;  Surgeon: Dolores Frame, MD;  Location: [facility];  Service: Gastroenterology;  Laterality: N/A;  945   HOT HEMOSTASIS  12/16/2021   Procedure: HOT HEMOSTASIS (ARGON PLASMA COAGULATION/BICAP);  Surgeon: Marguerita Merles, Reuel Boom, MD;  Location: [facility];  Service: Gastroenterology;;   POLYPECTOMY  12/16/2021   Procedure: POLYPECTOMY;  Surgeon: Dolores Frame, MD;  Location: [facility];  Service: Gastroenterology;;   RENAL BIOPSY  06/02/2022   SUBMUCOSAL TATTOO INJECTION  12/16/2021   Procedure: SUBMUCOSAL TATTOO INJECTION;  Surgeon: Dolores Frame, MD;  Location: [facility];  Service: Gastroenterology;;   Family History  Problem Relation Age of Onset   Thyroid disease Mother    Hypertension Mother    Heart attack Mother    Stroke Mother    Heart failure Mother    Diabetes Mother    Emphysema Mother    Cancer Mother    Diabetes Father    Heart attack Father    Stroke Father    Kidney disease Father    Heart failure Father    Social History   Socioeconomic History   Marital status: Widowed    Spouse name: Not on file   Number of children: Not on file   Years of education: Not on file   Highest  education level: 12th grade  Occupational History   Not on file  Tobacco Use   Smoking status: Former    Current packs/day: 0.00    Types: Cigarettes    Quit date: 10/29/2020    Years since quitting: 2.7   Smokeless tobacco: Never  Vaping Use   Vaping status: Never Used  Substance and Sexual Activity   Alcohol use: Never   Drug use: Never   Sexual activity: Not on file  Other Topics Concern   Not on file  Social History Narrative   Not on file   Social Determinants of Health   Financial Resource Strain: Low Risk  (07/25/2023)   Overall Financial Resource Strain (CARDIA)    Difficulty of Paying Living Expenses: Not hard at all  Food Insecurity: No Food Insecurity (  07/25/2023)   Hunger Vital Sign    Worried About Running Out of Food in the Last Year: Never true    Ran Out of Food in the Last Year: Never true  Transportation Needs: No Transportation Needs (07/25/2023)   PRAPARE - Administrator, Civil Service (Medical): No    Lack of Transportation (Non-Medical): No  Physical Activity: Sufficiently Active (07/25/2023)   Exercise Vital Sign    Days of Exercise per Week: 7 days    Minutes of Exercise per Session: 30 min  Stress: No Stress Concern Present (07/25/2023)   [employer] of Occupational Health - Occupational Stress Questionnaire    Feeling of Stress : Not at all  Social Connections: Unknown (07/25/2023)   Social Connection and Isolation Panel [NHANES]    Frequency of Communication with Friends and Family: Patient declined    Frequency of Social Gatherings with Friends and Family: Patient declined    Attends Religious Services: Patient declined    Database administrator or Organizations: Patient declined    Attends Banker Meetings: Patient declined    Marital Status: Widowed    Tobacco Counseling Counseling given: Yes   Clinical Intake:  Pre-visit preparation completed: Yes  Pain : 0-10 Pain Score: 8  Pain Type: Chronic  pain Pain Location:  (pt states all over) Pain Orientation:  ("all over") Pain Descriptors / Indicators: Burning, Constant, Aching, Throbbing, Shooting Pain Onset: More than a month ago Pain Frequency: Constant     BMI - recorded: 39.45 Nutritional Status: BMI > 30  Obese Nutritional Risks: None Diabetes: Yes CBG done?: No (telehealth visit) Did pt. bring in CBG monitor from home?: No  How often do you need to have someone help you when you read instructions, pamphlets, or other written materials from your doctor or pharmacy?: 1 - Never  Interpreter Needed?: No  Information entered by ::  Adelaido Nicklaus, CMA   Activities of Daily Living    07/25/2023    2:17 PM  In your present state of health, do you have any difficulty performing the following activities:  Hearing? 0  Vision? 0  Difficulty concentrating or making decisions? 0  Walking or climbing stairs? 0  Dressing or bathing? 0  Doing errands, shopping? 0  Preparing Food and eating ? N  Using the Toilet? N  In the past six months, have you accidently leaked urine? N  Do you have problems with loss of bowel control? N  Managing your Medications? N  Managing your Finances? N  Housekeeping or managing your Housekeeping? N    Patient Care Team: Anabel Halon, MD as PCP - General (Internal Medicine) Jonelle Sidle, MD as PCP - Cardiology (Cardiology) Daisy Lazar, DO (Optometry)  Indicate any recent Medical Services you may have received from other than [facility] providers in the past year (date may be approximate).     Assessment:   This is a routine wellness examination for Christine Weaver.  Hearing/Vision screen Hearing Screening - Comments:: Patient denies any hearing difficulties.    Vision Screening - Comments:: Patient is utd with yearly eye exams w/ Dr. Daisy Lazar   Goals Addressed             This Visit's Progress    Patient Stated   On track    Work out        Depression Screen    07/25/2023     2:11 PM 06/21/2023    9:58 AM 05/14/2023   10:10  AM 12/14/2022    1:45 PM 12/12/2022    3:31 PM 07/17/2022    8:50 AM 07/17/2022    8:48 AM  PHQ 2/9 Scores  PHQ - 2 Score 0 0 0 0 0 0 0  PHQ- 9 Score 0 0   0      Fall Risk    07/25/2023    2:17 PM 06/21/2023    9:58 AM 05/14/2023   10:10 AM 12/14/2022    1:45 PM 07/17/2022    8:50 AM  Fall Risk   Falls in the past year? 0 0 1 0 0  Number falls in past yr: 0 0 0 0 0  Injury with Fall? 0 0 0 0 0  Risk for fall due to : No Fall Risks    No Fall Risks  Follow up Falls prevention discussed    Falls evaluation completed    MEDICARE RISK AT HOME: Medicare Risk at Home Any stairs in or around the home?: No If so, are there any without handrails?: No Home free of loose throw rugs in walkways, pet beds, electrical cords, etc?: Yes Adequate lighting in your home to reduce risk of falls?: Yes Life alert?: No Use of a cane, walker or w/c?: No Grab bars in the bathroom?: No Shower chair or bench in shower?: No Elevated toilet seat or a handicapped toilet?: No  TIMED UP AND GO:  Was the test performed?  No    Cognitive Function:    07/17/2022    8:50 AM  MMSE - Mini Mental State Exam  Not completed: Unable to complete        07/25/2023    2:11 PM 07/17/2022    8:50 AM 07/15/2021    2:11 PM  6CIT Screen  What Year? 0 points 0 points 0 points  What month? 0 points 0 points 0 points  What time? 0 points 0 points 0 points  Count back from 20 0 points 0 points 0 points  Months in reverse 0 points 0 points 0 points  Repeat phrase 0 points 0 points 0 points  Total Score 0 points 0 points 0 points    Immunizations Immunization History  Administered Date(s) Administered   Moderna SARS-COV2 Booster Vaccination 09/03/2021   Moderna Sars-Covid-2 Vaccination 12/11/2019, 11/13/2020   PNEUMOCOCCAL CONJUGATE-20 10/06/2021   Tdap 03/13/2018   Zoster Recombinant(Shingrix) 10/06/2021, 02/06/2022    TDAP status: Up to date  Flu Vaccine  status: Declined, Education has been provided regarding the importance of this vaccine but patient still declined. Advised may receive this vaccine at local pharmacy or Health Dept. Aware to provide a copy of the vaccination record if obtained from local pharmacy or Health Dept. Verbalized acceptance and understanding.  Pneumococcal vaccine status: Not age appropriate for this patient.   Covid-19 vaccine status: Information provided on how to obtain vaccines.   Qualifies for Shingles Vaccine? No   Zostavax completed No   Shingrix Completed?: Yes  Screening Tests Health Maintenance  Topic Date Due   COVID-19 Vaccine (3 - Moderna risk series) 10/01/2021   OPHTHALMOLOGY EXAM  05/31/2022   Diabetic kidney evaluation - Urine ACR  02/07/2023   INFLUENZA VACCINE  Never done   MAMMOGRAM  06/21/2023   HEMOGLOBIN A1C  01/08/2024   Diabetic kidney evaluation - eGFR measurement  03/26/2024   FOOT EXAM  06/20/2024   Medicare Annual Wellness (AWV)  07/24/2024   Colonoscopy  12/16/2024   Cervical Cancer Screening (HPV/Pap Cotest)  07/11/2026  DTaP/Tdap/Td (2 - Td or Tdap) 03/13/2028   Hepatitis C Screening  Completed   HIV Screening  Completed   Zoster Vaccines- Shingrix  Completed   HPV VACCINES  Aged Out    Health Maintenance  Health Maintenance Due  Topic Date Due   COVID-19 Vaccine (3 - Moderna risk series) 10/01/2021   OPHTHALMOLOGY EXAM  05/31/2022   Diabetic kidney evaluation - Urine ACR  02/07/2023   INFLUENZA VACCINE  Never done   MAMMOGRAM  06/21/2023    Colorectal cancer screening: Type of screening: Colonoscopy. Completed 2/17/. Repeat every 3 years  Mammogram status: Ordered 07/25/23. Pt provided with contact info and advised to call to schedule appt.   Bone Density Screening: Not age appropriate for this patient.   Lung Cancer Screening: (Low Dose CT Chest recommended if Age 36-80 years, 20 pack-year currently smoking OR have quit w/in 15years.) does not qualify.    Lung Cancer Screening Referral: na  Additional Screening:  Hepatitis C Screening: does not qualify; Completed 01/27/2021  Vision Screening: Recommended annual ophthalmology exams for early detection of glaucoma and other disorders of the eye. Is the patient up to date with their annual eye exam?  Yes  Who is the provider or what is the name of the office in which the patient attends annual eye exams? Daisy Lazar If pt is not established with a provider, would they like to be referred to a provider to establish care? No .   Dental Screening: Recommended annual dental exams for proper oral hygiene  Diabetic Foot Exam: Diabetic Foot Exam: Completed 06/21/2023  Community Resource Referral / Chronic Care Management: CRR required this visit?  No   CCM required this visit?  No     Plan:     I have personally reviewed and noted the following in the patient's chart:   Medical and social history Use of alcohol, tobacco or illicit drugs  Current medications and supplements including opioid prescriptions. Patient is currently taking opioid prescriptions. Information provided to patient regarding non-opioid alternatives. Patient advised to discuss non-opioid treatment plan with their provider. Functional ability and status Nutritional status Physical activity Advanced directives List of other physicians Hospitalizations, surgeries, and ER visits in previous 12 months Vitals Screenings to include cognitive, depression, and falls Referrals and appointments  In addition, I have reviewed and discussed with patient certain preventive protocols, quality metrics, and best practice recommendations. A written personalized care plan for preventive services as well as general preventive health recommendations were provided to patient.     Jordan Hawks Fujie Dickison, CMA   07/25/2023   After Visit Summary: (MyChart) Due to this being a telephonic visit, the after visit summary with patients personalized  plan was offered to patient via MyChart

## 2023-08-06 ENCOUNTER — Telehealth: Payer: Self-pay

## 2023-08-06 NOTE — Telephone Encounter (Signed)
Left a message requesting pt to return call to the office. 

## 2023-08-08 ENCOUNTER — Telehealth: Payer: Self-pay

## 2023-08-08 NOTE — Telephone Encounter (Signed)
Pt called with high BG readings.   Date Before breakfast Before lunch Before supper Bedtime  08/06/23 [phone]  08/07/23 [phone]  08/08/23 [phone]            Pt taking: Metformin 500mg  bid, Mounjaro 7.5mg  weekly, started this dose as of yesterday and Lantus 80 units at bedtime. Pt did not start Jardiance.

## 2023-08-08 NOTE — Telephone Encounter (Signed)
Spoke with pt advising to restart Humalog 10-16 units tidac and start Jardiance also. Call back if BG below 70 or greater than 200 x 3 per Dr.Nida's orders. Understanding voiced.

## 2023-08-10 DIAGNOSIS — M5432 Sciatica, left side: Secondary | ICD-10-CM | POA: Diagnosis not present

## 2023-08-10 DIAGNOSIS — G894 Chronic pain syndrome: Secondary | ICD-10-CM | POA: Diagnosis not present

## 2023-08-10 DIAGNOSIS — R202 Paresthesia of skin: Secondary | ICD-10-CM | POA: Diagnosis not present

## 2023-08-10 DIAGNOSIS — M542 Cervicalgia: Secondary | ICD-10-CM | POA: Diagnosis not present

## 2023-08-10 DIAGNOSIS — E1142 Type 2 diabetes mellitus with diabetic polyneuropathy: Secondary | ICD-10-CM | POA: Diagnosis not present

## 2023-08-10 DIAGNOSIS — G8929 Other chronic pain: Secondary | ICD-10-CM | POA: Diagnosis not present

## 2023-08-10 DIAGNOSIS — Z79891 Long term (current) use of opiate analgesic: Secondary | ICD-10-CM | POA: Diagnosis not present

## 2023-08-10 DIAGNOSIS — M5431 Sciatica, right side: Secondary | ICD-10-CM | POA: Diagnosis not present

## 2023-08-10 DIAGNOSIS — M545 Low back pain, unspecified: Secondary | ICD-10-CM | POA: Diagnosis not present

## 2023-08-10 DIAGNOSIS — M5136 Other intervertebral disc degeneration, lumbar region with discogenic back pain only: Secondary | ICD-10-CM | POA: Diagnosis not present

## 2023-08-17 DIAGNOSIS — J449 Chronic obstructive pulmonary disease, unspecified: Secondary | ICD-10-CM | POA: Diagnosis not present

## 2023-08-17 DIAGNOSIS — I509 Heart failure, unspecified: Secondary | ICD-10-CM | POA: Diagnosis not present

## 2023-08-19 NOTE — Progress Notes (Unsigned)
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  Quit work October 03, 2003 at 220 lb and gained up 250 until husband died in 2020-10-02  After moving to [facility] 10/03/2011 saw [facility] doctor feeling sluggish and dx with asthma > meds didn't help much then started doe which seemed better with alb neb then added advair around 2014/15 then 02     History of Present Illness  06/30/2021  Pulmonary/ 1st office eval/ Christine Weaver / [place] Office  Chief Complaint  Patient presents with   Follow-up    Pt. Is on 2L O2 when sleeping and out running errands but tries not to wear it in the house unless she needs it. Coughing but not normally coughing up mucus.   Dyspnea:  foodlion 50% using 02 2lpm/  Cough: always coughing some very hoarse x years >minimally productive  Sleep: 45 degrees with pillows  SABA use: no longer needing since 02  02 :  2lpm at bedtime and prn daytime  Intol of dpi  Rec Omeprazole 40 mg Take  30-60 min before first meal of the day and Pepcid (famotidine)  20 mg after supper until return to office - this is the best way to tell whether stomach acid is contributing to your problem.   GERD rx Stop advair and start symbicort 80 Take 2 puffs first thing in am and then another 2 puffs about 12 hours later.  Work on inhaler technique: Marland Kitchen  Make sure you check your oxygen saturation  at your highest level of activity   Please schedule a follow up office visit in 6-8  weeks, call sooner if needed with pfts on return   08/30/2021  f/u ov/Christine Weaver office/Christine Weaver re:  maint on symbicort 80 2bid     Chief Complaint  Patient presents with   Follow-up    2 lpm all of the time. Persistent dry cough. Feels inhaler is helping.   Dyspnea:   push mower x 20 min s 02  says after stopped 89% but not checking  while  exerting  Cough: dry  Sleeping: flat bed 45 degrees SABA use: minimal  02: 2lpm hs and prn  Covid status: vax 3  Lung cancer screening: referred  Rec We will have our lung screening nurse practioner call you to set  up follow up CT not due til Feb 2023 Make sure you check your oxygen saturation  at your highest level of activity  to be sure it stays over 90% Keep your appt for your PFTs  Please schedule a follow up visit in 6  months but call sooner if needed  Late add not on amlodipine or lisinopril so started avapro 75 mg daily     03/06/2022  f/u ov/[place] office/Christine Weaver re: COPD/AB maint on symbicort 80 2bid   Chief Complaint  Patient presents with   Follow-up    Since allergy season has started patient has been having to use portable O2 while out   Dyspnea:  says can walk the whole store@ [employer] or [employer] / mall walking slower than others = MMRC2 = can't walk a nl pace on a flat grade s sob but does fine slow and flat eg  Cough: min mucoid at hs but not does not keep her up once  asleep Sleeping: 45 degrees with pillows / sleeps with cat  SABA use: up to 3 x daily  due to "allergy season" 02: 2lpm NP when  "cat wakes her up because 02 drops"  Covid status: x 3  Lung cancer screening: due  Rec I will be referring you to ENT for chronic hoarseness > not done as of 02/19/2023  We will be calling to arrange for you to start lung cancer screening  We will be scheduling PFTs  in [place] either when you go for ENT or pain management  Work on inhaler technique:   Please schedule a follow up visit in 6 months but call sooner if needed       02/19/2023  f/u ov/[place] office/Christine Weaver re: AB GOLD 0  maint on symbicort 80   Chief Complaint  Patient presents with   Follow-up    Pt f/u states that she is feeling well other than having allergy problems  Dyspnea:  working out with treadmill 4 x weekly x 30 min at top speed 1.2 mph x incline all the way up with doe s 02 or monitoring  sats as rec  Cough: with pollen/ min prod white mucus  Sleeping: flat bed pillows  SABA use: 2x per week 02: 2lpm/  prn daytime  Rec For allergies > zyrtec at bedtime will cover 24 hours  Make sure you check your oxygen saturation  AT  your highest level of activity (not after you stop)   to be sure it stays over 90%  My office will be contacting you by phone for referral to ENT   - if you don't hear back from my office within one week please call us back or notify us thru MyChart and we'll address it right away.    08/20/2023  13m f/u ov/ office/Christine Weaver re: GOLD 0/AB  maint on symbiocrt 80 and at least 3 x yearly  with uri  and allergy fall  > spring with rec benadryl per [facility] allergist  Chief Complaint  Patient presents with   Hoarse  Dyspnea:  working out on 02 2lpm POC on treadmill x 30 min  Cough: ok / still hoarse  Sleeping: flat bed/ 2 pillows s    resp cc  SABA use: none now / need solution  02: 1.5 lpm hs and prn  Lung cancer screening:due q June    No obvious day to day or daytime variability or assoc excess/ purulent sputum or mucus plugs or hemoptysis or cp or chest tightness, subjective wheeze or overt sinus or hb symptoms.    Also denies any obvious fluctuation of symptoms with weather or environmental changes or other aggravating or alleviating factors except as outlined above   No unusual exposure hx or h/o childhood pna/ asthma or knowledge of premature birth.  Current Allergies, Complete Past Medical History, Past Surgical History, Family History, and Social History were reviewed in [employer] record.  ROS  The following are not active complaints unless bolded Hoarseness, sore throat, dysphagia, dental problems, itching, sneezing,  nasal congestion or discharge of excess mucus or purulent secretions, ear ache,   fever, chills, sweats, unintended wt loss or wt gain, classically pleuritic or exertional cp,  orthopnea pnd or arm/hand  swelling  or leg swelling, presyncope, palpitations, abdominal pain, anorexia, nausea, vomiting, diarrhea  or change in bowel habits or change in bladder habits, change in stools or change in urine, dysuria, hematuria,  rash, arthralgias, visual complaints, headache, numbness, weakness or  ataxia or problems with walking or coordination,  change in mood or  memory.        Current Meds  Medication Sig   albuterol (VENTOLIN HFA) 108 (90 Base) MCG/ACT inhaler Inhale 2 puffs into the lungs every 4 (four) hours as needed for wheezing or shortness of breath.   atorvastatin (LIPITOR) 80 MG tablet TAKE 1 TABLET BY MOUTH EVERY DAY   BD PEN NEEDLE NANO 2ND GEN 32G X 4 MM MISC USE 1 PEN NEEDLE FOR 6 INJECTIONS DAILY FOR DIABETES E11.9   budesonide-formoterol (SYMBICORT) 80-4.5 MCG/ACT inhaler TAKE 2 PUFFS FIRST THING IN AM AND THEN ANOTHER 2 PUFFS ABOUT 12 HOURS LATER   Continuous Blood Gluc Receiver (FREESTYLE LIBRE 2 READER) DEVI As directed   cyclobenzaprine (FLEXERIL) 5 MG tablet TAKE 1 TABLET BY MOUTH 2 TIMES DAILY AS NEEDED FOR MUSCLE SPASMS.   diclofenac (CATAFLAM) 50 MG tablet Take 50 mg by mouth daily as needed.   diclofenac Sodium (VOLTAREN) 1 % GEL Apply 1 Application topically 4 (four) times daily as needed (pain).   empagliflozin (JARDIANCE) 10 MG TABS tablet Take 1 tablet (10 mg total) by mouth daily before breakfast.   famotidine (PEPCID) 20 MG tablet TAKE 1 TABLET AFTER SUPPER   Ferrous Sulfate (IRON PO) Take 1 tablet by mouth daily.   fluticasone (FLONASE) 50 MCG/ACT nasal spray Place 1 spray into both nostrils daily as needed for allergies.   furosemide (LASIX) 20 MG tablet Take 1 tablet (20 mg total) by mouth daily.   glucose blood (ACCU-CHEK AVIVA PLUS) test strip    HYDROcodone-acetaminophen (NORCO/VICODIN) 5-325 MG tablet Take 1 tablet by mouth every 12 (twelve) hours as needed for severe pain.   insulin glargine (LANTUS SOLOSTAR) 100 UNIT/ML Solostar Pen Inject 80 Units into the skin  at bedtime.   irbesartan (AVAPRO) 75 MG tablet TAKE 1 TABLET BY MOUTH EVERY DAY   KERENDIA 10 MG TABS Take 1 tablet by mouth daily.   metFORMIN (GLUCOPHAGE) 500 MG tablet TAKE 1 TABLET BY MOUTH TWICE A DAY WITH FOOD   naloxone (NARCAN) nasal spray 4 mg/0.1 mL Place 1 spray into the nose once.   nystatin cream (MYCOSTATIN) Apply 1 application  topically 2 (two) times daily as needed (rash).   omeprazole (PRILOSEC) 40 MG capsule TAKE 1 CAPSULE (40 MG TOTAL) BY MOUTH DAILY.   OVER THE COUNTER MEDICATION Calcium one daily   OVER THE COUNTER MEDICATION Iron 65 mg one bid   tirzepatide (MOUNJARO) 7.5 MG/0.5ML Pen INJECT 7.5 MG SUBCUTANEOUSLY WEEKLY   VITAMIN D PO Take 1 capsule by mouth daily.                Past Medical History:  Diagnosis Date   Asthma    COPD (chronic obstructive pulmonary disease) (HCC)    Hyperlipidemia    Hypertension    Type 2 diabetes mellitus (HCC)         Objective:    Wts  08/20/2023     199  02/19/2023       206 11/16/2022       200  03/06/2022         206   08/30/21 212 lb (96.2 kg)  07/11/21 209 lb 6.4 oz (95 kg)  06/30/21 210 lb 1.6 oz (95.3 kg)    Vital signs reviewed  08/20/2023  - Note at rest 02 sats  95% on RA   General appearance:   very  hoarse mod obese (by bmi)  wf wf  and      HEENT : Oropharynx  clear/ edentulous   NECK :  without  apparent JVD/ palpable Nodes/TM    LUNGS: no acc muscle use,  Nl contour chest which is clear to A and P bilaterally without cough on insp or exp maneuvers   CV:  RRR  no s3 or murmur or increase in P2, and no edema   ABD:  obese soft and nontender    MS:  Nl gait/ ext warm without deformities Or obvious joint restrictions  calf tenderness, cyanosis or clubbing    SKIN: warm and dry without lesions    NEURO:  alert, approp, nl sensorium with  no motor or cerebellar deficits apparent.            Assessment

## 2023-08-20 ENCOUNTER — Ambulatory Visit (INDEPENDENT_AMBULATORY_CARE_PROVIDER_SITE_OTHER): Payer: 59 | Admitting: Internal Medicine

## 2023-08-20 ENCOUNTER — Other Ambulatory Visit: Payer: Self-pay | Admitting: Internal Medicine

## 2023-08-20 ENCOUNTER — Encounter: Payer: Self-pay | Admitting: Internal Medicine

## 2023-08-20 VITALS — BP 158/91 | HR 91 | Ht 60.0 in | Wt 199.0 lb

## 2023-08-20 DIAGNOSIS — Z87891 Personal history of nicotine dependence: Secondary | ICD-10-CM | POA: Insufficient documentation

## 2023-08-20 DIAGNOSIS — I10 Essential (primary) hypertension: Secondary | ICD-10-CM

## 2023-08-20 DIAGNOSIS — J4489 Other specified chronic obstructive pulmonary disease: Secondary | ICD-10-CM | POA: Diagnosis not present

## 2023-08-20 DIAGNOSIS — K219 Gastro-esophageal reflux disease without esophagitis: Secondary | ICD-10-CM | POA: Diagnosis not present

## 2023-08-20 DIAGNOSIS — E7800 Pure hypercholesterolemia, unspecified: Secondary | ICD-10-CM | POA: Insufficient documentation

## 2023-08-20 DIAGNOSIS — R490 Dysphonia: Secondary | ICD-10-CM | POA: Diagnosis not present

## 2023-08-20 DIAGNOSIS — E7849 Other hyperlipidemia: Secondary | ICD-10-CM

## 2023-08-20 MED ORDER — ALBUTEROL SULFATE (2.5 MG/3ML) 0.083% IN NEBU: 2.5000 mg | INHALATION_SOLUTION | RESPIRATORY_TRACT | 12 refills | Status: DC | PRN

## 2023-08-20 MED ORDER — OMEPRAZOLE 40 MG PO CPDR: 40.0000 mg | DELAYED_RELEASE_CAPSULE | Freq: Every day | ORAL | 1 refills | Status: DC

## 2023-08-20 MED ORDER — BUDESONIDE-FORMOTEROL FUMARATE 160-4.5 MCG/ACT IN AERO: INHALATION_SPRAY | RESPIRATORY_TRACT | 12 refills | Status: AC

## 2023-08-20 NOTE — Assessment & Plan Note (Addendum)
Quit smoking 09/2020  - try off acei 06/30/2021  - change advair to symb 80 2bid and max rx for gerd  - alpha one  06/30/21    MM   Level  142 - Allergy profile 06/30/21  >  Eos 0.1 /  IgE  124 -Chest LDSCT   04/25/22  mild centrilobular emphysema/bronchiolitis - PFT's  11/27/22  FEV1 1.97 (82 % ) ratio 0.81  p 20 % improvement from saba p 0 prior to study with DLCO  14.06 (83%)   and FV curve mildly concave and ERV 56% at wt 195   - 08/20/2023  After extensive coaching inhaler device,  effectiveness =    90% with hfa so continue symbiort 80 with 160 for flares   Concerned about hoarsness with high dose ICS so will just change to symbicort 160 for flares as I doubt AirSupra covered by united/ medicaid   Discussed in detail all the  indications, usual  risks and alternatives  relative to the benefits with patient who agrees to proceed with Rx as outlined.         Each maintenance medication was reviewed in detail including emphasizing most importantly the difference between maintenance and prns and under what circumstances the prns are to be triggered using an action plan format where appropriate.  Total time for H and P, chart review, counseling, reviewing hfa/02/pulse ox  device(s) and generating customized AVS unique to this office visit / same day charting > 30 min

## 2023-08-20 NOTE — Assessment & Plan Note (Addendum)
Referred to ENT 03/06/2022 >>> again 11/16/2022 > again 02/19/2023  - 08/20/2023 try [facility] / continue gerd rx

## 2023-08-20 NOTE — Assessment & Plan Note (Addendum)
Referred for LCS done q June   Low-dose CT lung cancer screening is recommended for patients who are 11-55 years of age with a 20+ pack-year history of smoking and who are currently smoking or quit <=15 years ago. No coughing up blood  No unintentional weight loss of > 15 pounds in the last 6 months - pt is eligible for scanning yearly until 2036    Pt needed to be reminded she is already in the program and should keep up with all recs/appts.

## 2023-08-20 NOTE — Patient Instructions (Addendum)
Make sure you check your oxygen saturation  AT  your highest level of activity (not after you stop)   to be sure it stays over 90% and adjust  02 flow upward to maintain this level if needed but remember to turn it back to previous settings when you stop (to conserve your supply).    Plan A = Automatic = Always=    Symbicort 80 Take 2 puffs first thing in am and then another 2 puffs about 12 hours later.     Plan B = Backup (to supplement plan A, not to replace it) Only use your albuterol inhaler as a rescue medication to be used if you can't catch your breath by resting or doing a relaxed purse lip breathing pattern.  - The less you use it, the better it will work when you need it. - Ok to use the inhaler up to 2 puffs  every 4 hours if you must but call for appointment if use goes up over your usual need - Don't leave home without it !!  (think of it like the spare tire for your car)   Plan C = Crisis (instead of Plan B but only if Plan B stops working) - only use your albuterol nebulizer if you first try Plan B and it fails to help > ok to use the nebulizer up to every 4 hours but if start needing it regularly call for immediate appointment  Plan D = when start allergy or cold change to Symbicort 160 Take 2 puffs first thing in am and then another 2 puffs about 12 hours later.   My office will be contacting you by phone for referral to ENT and lung cancer screening program   - if you don't hear back from my office within one week please call us back or notify us thru MyChart and we'll address it right away.   Please schedule a follow up visit in 6  months but call sooner if needed   Error - already in LCS program

## 2023-08-31 DIAGNOSIS — N1831 Chronic kidney disease, stage 3a: Secondary | ICD-10-CM | POA: Diagnosis not present

## 2023-08-31 DIAGNOSIS — N189 Chronic kidney disease, unspecified: Secondary | ICD-10-CM | POA: Diagnosis not present

## 2023-08-31 DIAGNOSIS — E1122 Type 2 diabetes mellitus with diabetic chronic kidney disease: Secondary | ICD-10-CM | POA: Diagnosis not present

## 2023-08-31 DIAGNOSIS — R808 Other proteinuria: Secondary | ICD-10-CM | POA: Diagnosis not present

## 2023-09-07 DIAGNOSIS — G8929 Other chronic pain: Secondary | ICD-10-CM | POA: Diagnosis not present

## 2023-09-07 DIAGNOSIS — M5136 Other intervertebral disc degeneration, lumbar region with discogenic back pain only: Secondary | ICD-10-CM | POA: Diagnosis not present

## 2023-09-07 DIAGNOSIS — E1142 Type 2 diabetes mellitus with diabetic polyneuropathy: Secondary | ICD-10-CM | POA: Diagnosis not present

## 2023-09-07 DIAGNOSIS — Z79891 Long term (current) use of opiate analgesic: Secondary | ICD-10-CM | POA: Diagnosis not present

## 2023-09-07 DIAGNOSIS — M5451 Vertebrogenic low back pain: Secondary | ICD-10-CM | POA: Diagnosis not present

## 2023-09-07 DIAGNOSIS — G894 Chronic pain syndrome: Secondary | ICD-10-CM | POA: Diagnosis not present

## 2023-09-13 DIAGNOSIS — E1165 Type 2 diabetes mellitus with hyperglycemia: Secondary | ICD-10-CM | POA: Diagnosis not present

## 2023-09-17 DIAGNOSIS — I509 Heart failure, unspecified: Secondary | ICD-10-CM | POA: Diagnosis not present

## 2023-09-17 DIAGNOSIS — J449 Chronic obstructive pulmonary disease, unspecified: Secondary | ICD-10-CM | POA: Diagnosis not present

## 2023-09-19 ENCOUNTER — Other Ambulatory Visit: Payer: Self-pay | Admitting: "Endocrinology

## 2023-09-19 ENCOUNTER — Other Ambulatory Visit: Payer: Self-pay | Admitting: Internal Medicine

## 2023-09-19 DIAGNOSIS — Z794 Long term (current) use of insulin: Secondary | ICD-10-CM

## 2023-09-19 DIAGNOSIS — M5431 Sciatica, right side: Secondary | ICD-10-CM

## 2023-09-19 DIAGNOSIS — E119 Type 2 diabetes mellitus without complications: Secondary | ICD-10-CM

## 2023-09-19 DIAGNOSIS — I10 Essential (primary) hypertension: Secondary | ICD-10-CM

## 2023-09-21 ENCOUNTER — Ambulatory Visit: Payer: 59 | Attending: Cardiology | Admitting: Cardiology

## 2023-09-21 ENCOUNTER — Encounter: Payer: Self-pay | Admitting: Cardiology

## 2023-09-21 VITALS — BP 130/82 | HR 87 | Ht 60.0 in | Wt 198.8 lb

## 2023-09-21 DIAGNOSIS — I5032 Chronic diastolic (congestive) heart failure: Secondary | ICD-10-CM | POA: Diagnosis not present

## 2023-09-21 DIAGNOSIS — I10 Essential (primary) hypertension: Secondary | ICD-10-CM

## 2023-09-21 NOTE — Progress Notes (Signed)
    Cardiology Office Note  Date: 09/21/2023   ID: Christine Weaver, DOB [birth year], MRN [id number]  History of Present Illness: Christine Weaver is a 57 y.o. female last seen in January 2023.  She is here for a follow-up visit.  Reports no exertional chest pain, stable use of diuretics with control of edema, NYHA class II dyspnea.  She exercises at [employer].  She had a visit with Dr. Sherene Weaver in October, I reviewed the note.  She uses supplemental oxygen as needed with activity.  I reviewed her medications.  Current regimen includes Lipitor, Jardiance, Lasix, Avapro, and Mounjaro.  Her most recent hemoglobin A1c was 7.9%.  She is hoping to lose some more weight.  ECG today shows normal sinus rhythm.  Physical Exam: VS:  BP 130/82 (BP Location: Left Arm)   Pulse 87   Ht 5' (1.524 m)   Wt 198 lb 12.8 oz (90.2 kg)   SpO2 97%   BMI 38.83 kg/m , BMI Body mass index is 38.83 kg/m.  Wt Readings from Last 3 Encounters:  09/21/23 198 lb 12.8 oz (90.2 kg)  08/20/23 199 lb (90.3 kg)  07/25/23 202 lb (91.6 kg)    General: Patient appears comfortable at rest. HEENT: Conjunctiva and lids normal. Neck: Supple, no elevated JVP or carotid bruits. Lungs: Clear to auscultation, nonlabored breathing at rest. Cardiac: Regular rate and rhythm, no S3 or significant systolic murmur. Extremities: No pitting edema.  ECG:  An ECG dated 11/09/2021 was personally reviewed today and demonstrated:  Sinus tachycardia with rightward axis and decreased R wave progression.  Labwork: 03/27/2023: ALT 14; AST 12; BUN 25; Creatinine, Ser 1.20; Potassium 4.4; Sodium 138     Component Value Date/Time   CHOL 199 03/27/2023 0855   TRIG 318 (H) 03/27/2023 0855   HDL 41 03/27/2023 0855   CHOLHDL 4.9 (H) 03/27/2023 0855   LDLCALC 104 (H) 03/27/2023 4098   Other Studies Reviewed Today:  No interval cardiac testing for review today.  Assessment and Plan:  1.  HFpEF, LVEF 60 to 65% with mild diastolic dysfunction  by echocardiogram in February 2023.  Clinically stable, NYHA class II dyspnea.  She remains on Lasix and reports good control of edema.  Also on [place].  2.  COPD with chronic hypoxia respiratory failure followed by pulmonary.  Uses supplemental oxygen as needed with activity.  3.  Primary hypertension.  Continue Avapro.  4.  Type 2 diabetes mellitus.  Hemoglobin A1c 7.9% in September.  She follows with Dr. Allena Weaver.  Currently on Mounjaro, Glucophage, insulin, and Jardiance.  Disposition:  Follow up  1 year.  Signed, Christine Weaver, M.D., F.A.C.C. [facility]

## 2023-09-21 NOTE — Patient Instructions (Signed)
Medication Instructions:  Your physician recommends that you continue on your current medications as directed. Please refer to the Current Medication list given to you today.   Labwork: None today  Testing/Procedures: None today  Follow-Up: 1 year  Any Other Special Instructions Will Be Listed Below (If Applicable).  If you need a refill on your cardiac medications before your next appointment, please call your pharmacy.

## 2023-10-01 ENCOUNTER — Other Ambulatory Visit: Payer: Self-pay | Admitting: Internal Medicine

## 2023-10-05 DIAGNOSIS — G8929 Other chronic pain: Secondary | ICD-10-CM | POA: Diagnosis not present

## 2023-10-05 DIAGNOSIS — M5136 Other intervertebral disc degeneration, lumbar region with discogenic back pain only: Secondary | ICD-10-CM | POA: Diagnosis not present

## 2023-10-05 DIAGNOSIS — Z79891 Long term (current) use of opiate analgesic: Secondary | ICD-10-CM | POA: Diagnosis not present

## 2023-10-05 DIAGNOSIS — M545 Low back pain, unspecified: Secondary | ICD-10-CM | POA: Diagnosis not present

## 2023-10-05 DIAGNOSIS — G894 Chronic pain syndrome: Secondary | ICD-10-CM | POA: Diagnosis not present

## 2023-10-05 DIAGNOSIS — E1142 Type 2 diabetes mellitus with diabetic polyneuropathy: Secondary | ICD-10-CM | POA: Diagnosis not present

## 2023-10-10 DIAGNOSIS — E1129 Type 2 diabetes mellitus with other diabetic kidney complication: Secondary | ICD-10-CM | POA: Diagnosis not present

## 2023-10-10 DIAGNOSIS — E1122 Type 2 diabetes mellitus with diabetic chronic kidney disease: Secondary | ICD-10-CM | POA: Diagnosis not present

## 2023-10-10 DIAGNOSIS — I5032 Chronic diastolic (congestive) heart failure: Secondary | ICD-10-CM | POA: Diagnosis not present

## 2023-10-10 DIAGNOSIS — R808 Other proteinuria: Secondary | ICD-10-CM | POA: Diagnosis not present

## 2023-10-17 ENCOUNTER — Other Ambulatory Visit: Payer: Self-pay | Admitting: Internal Medicine

## 2023-10-17 ENCOUNTER — Other Ambulatory Visit: Payer: Self-pay | Admitting: "Endocrinology

## 2023-10-17 DIAGNOSIS — J449 Chronic obstructive pulmonary disease, unspecified: Secondary | ICD-10-CM | POA: Diagnosis not present

## 2023-10-17 DIAGNOSIS — I509 Heart failure, unspecified: Secondary | ICD-10-CM | POA: Diagnosis not present

## 2023-10-17 DIAGNOSIS — I10 Essential (primary) hypertension: Secondary | ICD-10-CM

## 2023-10-19 ENCOUNTER — Other Ambulatory Visit: Payer: Self-pay

## 2023-10-19 DIAGNOSIS — E119 Type 2 diabetes mellitus without complications: Secondary | ICD-10-CM

## 2023-10-19 MED ORDER — BASAGLAR KWIKPEN 100 UNIT/ML ~~LOC~~ SOPN: 80.0000 [IU] | PEN_INJECTOR | Freq: Every day | SUBCUTANEOUS | 0 refills | Status: DC

## 2023-10-28 ENCOUNTER — Other Ambulatory Visit: Payer: Self-pay | Admitting: "Endocrinology

## 2023-10-28 ENCOUNTER — Other Ambulatory Visit: Payer: Self-pay | Admitting: Internal Medicine

## 2023-10-28 DIAGNOSIS — E119 Type 2 diabetes mellitus without complications: Secondary | ICD-10-CM

## 2023-10-28 DIAGNOSIS — M5431 Sciatica, right side: Secondary | ICD-10-CM

## 2023-11-02 DIAGNOSIS — Z79891 Long term (current) use of opiate analgesic: Secondary | ICD-10-CM | POA: Diagnosis not present

## 2023-11-02 DIAGNOSIS — M5136 Other intervertebral disc degeneration, lumbar region with discogenic back pain only: Secondary | ICD-10-CM | POA: Diagnosis not present

## 2023-11-02 DIAGNOSIS — G894 Chronic pain syndrome: Secondary | ICD-10-CM | POA: Diagnosis not present

## 2023-11-02 DIAGNOSIS — M545 Low back pain, unspecified: Secondary | ICD-10-CM | POA: Diagnosis not present

## 2023-11-02 DIAGNOSIS — E1142 Type 2 diabetes mellitus with diabetic polyneuropathy: Secondary | ICD-10-CM | POA: Diagnosis not present

## 2023-11-13 DIAGNOSIS — E1165 Type 2 diabetes mellitus with hyperglycemia: Secondary | ICD-10-CM | POA: Diagnosis not present

## 2023-11-14 DIAGNOSIS — R809 Proteinuria, unspecified: Secondary | ICD-10-CM | POA: Diagnosis not present

## 2023-11-14 DIAGNOSIS — N189 Chronic kidney disease, unspecified: Secondary | ICD-10-CM | POA: Diagnosis not present

## 2023-11-14 DIAGNOSIS — E119 Type 2 diabetes mellitus without complications: Secondary | ICD-10-CM | POA: Diagnosis not present

## 2023-11-14 DIAGNOSIS — Z794 Long term (current) use of insulin: Secondary | ICD-10-CM | POA: Diagnosis not present

## 2023-11-14 DIAGNOSIS — D631 Anemia in chronic kidney disease: Secondary | ICD-10-CM | POA: Diagnosis not present

## 2023-11-15 ENCOUNTER — Other Ambulatory Visit: Payer: Self-pay | Admitting: Internal Medicine

## 2023-11-15 ENCOUNTER — Other Ambulatory Visit: Payer: Self-pay | Admitting: "Endocrinology

## 2023-11-15 LAB — COMPREHENSIVE METABOLIC PANEL
ALT: 15 [IU]/L (ref 0–32)
AST: 14 [IU]/L (ref 0–40)
Albumin: 3.6 g/dL — ABNORMAL LOW (ref 3.8–4.9)
Alkaline Phosphatase: 139 [IU]/L — ABNORMAL HIGH (ref 44–121)
BUN/Creatinine Ratio: 14 (ref 9–23)
BUN: 18 mg/dL (ref 6–24)
Bilirubin Total: 0.2 mg/dL (ref 0.0–1.2)
CO2: 23 mmol/L (ref 20–29)
Calcium: 9.7 mg/dL (ref 8.7–10.2)
Chloride: 99 mmol/L (ref 96–106)
Creatinine, Ser: 1.32 mg/dL — ABNORMAL HIGH (ref 0.57–1.00)
Globulin, Total: 2.8 g/dL (ref 1.5–4.5)
Glucose: 161 mg/dL — ABNORMAL HIGH (ref 70–99)
Potassium: 4.6 mmol/L (ref 3.5–5.2)
Sodium: 139 mmol/L (ref 134–144)
Total Protein: 6.4 g/dL (ref 6.0–8.5)
eGFR: 48 mL/min/{1.73_m2} — ABNORMAL LOW (ref >59)

## 2023-11-15 LAB — LIPID PANEL
Chol/HDL Ratio: 5.9 {ratio} — ABNORMAL HIGH (ref 0.0–4.4)
Cholesterol, Total: 212 mg/dL — ABNORMAL HIGH (ref 100–199)
HDL: 36 mg/dL — ABNORMAL LOW (ref >39)
LDL Chol Calc (NIH): 119 mg/dL — ABNORMAL HIGH (ref 0–99)
Triglycerides: 328 mg/dL — ABNORMAL HIGH (ref 0–149)
VLDL Cholesterol Cal: 57 mg/dL — ABNORMAL HIGH (ref 5–40)

## 2023-11-15 LAB — T4, FREE: Free T4: 1.13 ng/dL (ref 0.82–1.77)

## 2023-11-15 LAB — TSH: TSH: 1.94 u[IU]/mL (ref 0.450–4.500)

## 2023-11-17 DIAGNOSIS — J449 Chronic obstructive pulmonary disease, unspecified: Secondary | ICD-10-CM | POA: Diagnosis not present

## 2023-11-17 DIAGNOSIS — I509 Heart failure, unspecified: Secondary | ICD-10-CM | POA: Diagnosis not present

## 2023-11-19 ENCOUNTER — Ambulatory Visit (INDEPENDENT_AMBULATORY_CARE_PROVIDER_SITE_OTHER): Payer: 59 | Admitting: "Endocrinology

## 2023-11-19 ENCOUNTER — Encounter: Payer: Self-pay | Admitting: "Endocrinology

## 2023-11-19 VITALS — BP 134/80 | HR 76 | Ht 60.0 in | Wt 202.2 lb

## 2023-11-19 DIAGNOSIS — I10 Essential (primary) hypertension: Secondary | ICD-10-CM

## 2023-11-19 DIAGNOSIS — E119 Type 2 diabetes mellitus without complications: Secondary | ICD-10-CM | POA: Diagnosis not present

## 2023-11-19 DIAGNOSIS — E782 Mixed hyperlipidemia: Secondary | ICD-10-CM | POA: Diagnosis not present

## 2023-11-19 DIAGNOSIS — Z794 Long term (current) use of insulin: Secondary | ICD-10-CM | POA: Diagnosis not present

## 2023-11-19 LAB — POCT GLYCOSYLATED HEMOGLOBIN (HGB A1C): HbA1c, POC (controlled diabetic range): 8.4 % — AB (ref 0.0–7.0)

## 2023-11-19 NOTE — Patient Instructions (Signed)

## 2023-11-19 NOTE — Progress Notes (Signed)
11/19/2023, 10:55 AM  Endocrinology follow-up note   Subjective:    Patient ID: Christine Weaver, female    DOB: 05-24-1966.  Christine Weaver is being seen in follow-up after she was seen in consultation for management of currently uncontrolled symptomatic diabetes requested by  Anabel Halon, MD.   Past Medical History:  Diagnosis Date   Asthma    Bell's palsy    COPD (chronic obstructive pulmonary disease) (HCC)    Hyperlipidemia    Hypertension    Lyme disease    Type 2 diabetes mellitus (HCC)     Past Surgical History:  Procedure Laterality Date   CESAREAN SECTION     COLONOSCOPY WITH PROPOFOL N/A 12/16/2021   Procedure: COLONOSCOPY WITH PROPOFOL;  Surgeon: Dolores Frame, MD;  [facility]: [facility];  Service: Gastroenterology;  Laterality: N/A;  945   HOT HEMOSTASIS  12/16/2021   Procedure: HOT HEMOSTASIS (ARGON PLASMA COAGULATION/BICAP);  Surgeon: Marguerita Merles, Reuel Boom, MD;  Location: [facility];  Service: Gastroenterology;;   POLYPECTOMY  12/16/2021   Procedure: POLYPECTOMY;  Surgeon: Dolores Frame, MD;  Location: [facility];  Service: Gastroenterology;;   RENAL BIOPSY  06/02/2022   SUBMUCOSAL TATTOO INJECTION  12/16/2021   Procedure: SUBMUCOSAL TATTOO INJECTION;  Surgeon: Dolores Frame, MD;  Location: [facility];  Service: Gastroenterology;;    Social History   Socioeconomic History   Marital status: Widowed    Spouse name: Not on file   Number of children: Not on file   Years of education: Not on file   Highest education level: 12th grade  Occupational History   Not on file  Tobacco Use   Smoking status: Former    Current packs/day: 0.00    Types: Cigarettes    Quit date: 10/29/2020    Years since quitting: 3.0   Smokeless tobacco: Never  Vaping Use   Vaping status: Never Used  Substance and Sexual Activity   Alcohol use: Never   Drug use: Never   Sexual activity: Not on file   Other Topics Concern   Not on file  Social History Narrative   Not on file   Social Drivers of Health   Financial Resource Strain: Low Risk  (07/25/2023)   Overall Financial Resource Strain (CARDIA)    Difficulty of Paying Living Expenses: Not hard at all  Food Insecurity: No Food Insecurity (07/25/2023)   Hunger Vital Sign    Worried About Running Out of Food in the Last Year: Never true    Ran Out of Food in the Last Year: Never true  Transportation Needs: No Transportation Needs (07/25/2023)   PRAPARE - Administrator, Civil Service (Medical): No    Lack of Transportation (Non-Medical): No  Physical Activity: Sufficiently Active (07/25/2023)   Exercise Vital Sign    Days of Exercise per Week: 7 days    Minutes of Exercise per Session: 30 min  Stress: No Stress Concern Present (07/25/2023)   [employer] of Occupational Health - Occupational Stress Questionnaire    Feeling of Stress : Not at all  Social Connections: Unknown (07/25/2023)   Social Connection and Isolation Panel [NHANES]    Frequency of Communication with Friends and Family: Patient declined    Frequency of Social Gatherings with Friends and Family: Patient declined    Attends Religious Services: Patient declined    Active Member of Clubs or Organizations: Patient declined    Attends [employer]  or Organization Meetings: Patient declined    Marital Status: Widowed    Family History  Problem Relation Age of Onset   Thyroid disease Mother    Hypertension Mother    Heart attack Mother    Stroke Mother    Heart failure Mother    Diabetes Mother    Emphysema Mother    Cancer Mother    Diabetes Father    Heart attack Father    Stroke Father    Kidney disease Father    Heart failure Father     Outpatient Encounter Medications as of 11/19/2023  Medication Sig   calcitRIOL (ROCALTROL) 0.25 MCG capsule Take 0.25 mcg by mouth 3 (three) times a week.   insulin lispro (HUMALOG KWIKPEN) 100 UNIT/ML KwikPen  Inject 10-16 Units into the skin 3 (three) times daily before meals.   albuterol (PROVENTIL) (2.5 MG/3ML) 0.083% nebulizer solution Take 3 mLs (2.5 mg total) by nebulization every 4 (four) hours as needed for wheezing or shortness of breath.   albuterol (VENTOLIN HFA) 108 (90 Base) MCG/ACT inhaler Inhale 2 puffs into the lungs every 4 (four) hours as needed for wheezing or shortness of breath.   amLODipine (NORVASC) 10 MG tablet TAKE 1 TABLET BY MOUTH EVERY DAY   atorvastatin (LIPITOR) 80 MG tablet TAKE 1 TABLET BY MOUTH EVERY DAY   BD PEN NEEDLE NANO 2ND GEN 32G X 4 MM MISC USE 1 PEN NEEDLE FOR 6 INJECTIONS DAILY FOR DIABETES E11.9   budesonide-formoterol (SYMBICORT) 160-4.5 MCG/ACT inhaler In event of flares:  take 2 puffs first thing in am and then another 2 puffs about 12 hours later.   Continuous Blood Gluc Receiver (FREESTYLE LIBRE 2 READER) DEVI As directed   cyclobenzaprine (FLEXERIL) 5 MG tablet TAKE 1 TABLET BY MOUTH 2 TIMES DAILY AS NEEDED FOR MUSCLE SPASMS.   diclofenac (CATAFLAM) 50 MG tablet Take 50 mg by mouth daily as needed.   diclofenac Sodium (VOLTAREN) 1 % GEL Apply 1 Application topically 4 (four) times daily as needed (pain).   famotidine (PEPCID) 20 MG tablet TAKE 1 TABLET AFTER SUPPER   Ferrous Sulfate (IRON PO) Take 1 tablet by mouth daily.   fluticasone (FLONASE) 50 MCG/ACT nasal spray Place 1 spray into both nostrils daily as needed for allergies.   furosemide (LASIX) 20 MG tablet Take 1 tablet (20 mg total) by mouth daily.   glucose blood (ACCU-CHEK AVIVA PLUS) test strip    HYDROcodone-acetaminophen (NORCO/VICODIN) 5-325 MG tablet Take 1 tablet by mouth every 12 (twelve) hours as needed for severe pain.   insulin glargine (LANTUS SOLOSTAR) 100 UNIT/ML Solostar Pen INJECT 80 UNITS INTO THE SKIN AT BEDTIME.   irbesartan (AVAPRO) 75 MG tablet TAKE 1 TABLET BY MOUTH EVERY DAY   JARDIANCE 10 MG TABS tablet TAKE 1 TABLET BY MOUTH DAILY BEFORE BREAKFAST.   KERENDIA 10 MG  TABS Take 1 tablet by mouth daily.   metFORMIN (GLUCOPHAGE) 500 MG tablet TAKE 1 TABLET BY MOUTH TWICE A DAY WITH FOOD   naloxone (NARCAN) nasal spray 4 mg/0.1 mL Place 1 spray into the nose once.   nystatin cream (MYCOSTATIN) Apply 1 application  topically 2 (two) times daily as needed (rash).   omeprazole (PRILOSEC) 40 MG capsule Take 1 capsule (40 mg total) by mouth daily.   OVER THE COUNTER MEDICATION Calcium one daily   OVER THE COUNTER MEDICATION Iron 65 mg one bid   tirzepatide (MOUNJARO) 7.5 MG/0.5ML Pen INJECT 7.5 MG SUBCUTANEOUSLY WEEKLY   VITAMIN D  PO Take 1 capsule by mouth daily.   [DISCONTINUED] FARXIGA 10 MG TABS tablet TAKE 1 TABLET BY MOUTH DAILY BEFORE BREAKFAST.   No facility-administered encounter medications on file as of 11/19/2023.    ALLERGIES: Allergies  Allergen Reactions   Morphine And Codeine Itching   Other Other (See Comments)    Bells Palsy    Oxycodone-Acetaminophen Other (See Comments)    Constipates patient    Latex Itching and Other (See Comments)    Skin "cracks" open     VACCINATION STATUS: Immunization History  Administered Date(s) Administered   Moderna SARS-COV2 Booster Vaccination 09/03/2021   Moderna Sars-Covid-2 Vaccination 12/11/2019, 11/13/2020   PNEUMOCOCCAL CONJUGATE-20 10/06/2021   Tdap 03/13/2018   Zoster Recombinant(Shingrix) 10/06/2021, 02/06/2022    Diabetes She presents for her follow-up diabetic visit. She has type 2 diabetes mellitus. Onset time: She was diagnosed at approximately age of 82 years. Her disease course has been worsening. There are no hypoglycemic associated symptoms. Pertinent negatives for hypoglycemia include no confusion, headaches, pallor or seizures. Pertinent negatives for diabetes include no chest pain, no fatigue, no polydipsia, no polyphagia and no polyuria. There are no hypoglycemic complications. Symptoms are worsening. Risk factors for coronary artery disease include dyslipidemia, diabetes  mellitus, hypertension, obesity, sedentary lifestyle and tobacco exposure. Current diabetic treatment includes insulin injections. Her weight is increasing steadily. She is following a generally unhealthy diet. When asked about meal planning, she reported none. She has not had a previous visit with a dietitian. She never participates in exercise. Her home blood glucose trend is increasing steadily. Her breakfast blood glucose range is generally 180-200 mg/dl. Her Weaver blood glucose range is generally 180-200 mg/dl. Her dinner blood glucose range is generally 180-200 mg/dl. Her bedtime blood glucose range is generally 180-200 mg/dl. Her overall blood glucose range is 180-200 mg/dl. (Christine Weaver presents with worsening glycemic profile.  Her CGM AGP shows 47% time range, 44% level 1 hyperglycemia, 9% level 2 hyperglycemia.  Her average blood glucose is 186 for the last 14 days.  Her point-of-care A1c is 8.4% increasing from 7.9% during her last visit.     ) An ACE inhibitor/angiotensin II receptor blocker is being taken. Eye exam is current.  Hyperlipidemia This is a chronic problem. The problem is uncontrolled. Recent lipid tests were reviewed and are high. Exacerbating diseases include diabetes and obesity. Pertinent negatives include no chest pain, myalgias or shortness of breath. Current antihyperlipidemic treatment includes statins. Risk factors for coronary artery disease include dyslipidemia, diabetes mellitus, family history, hypertension, obesity and a sedentary lifestyle.  Hypertension This is a chronic problem. The current episode started more than 1 year ago. The problem is uncontrolled. Pertinent negatives include no chest pain, headaches, palpitations or shortness of breath. Risk factors for coronary artery disease include diabetes mellitus, dyslipidemia, obesity, sedentary lifestyle, smoking/tobacco exposure and family history. Past treatments include ACE inhibitors.   Review of systems: Limited  as above.    Objective:       11/19/2023    9:49 AM 09/21/2023   10:33 AM 09/21/2023   10:00 AM  Vitals with BMI  Height 5\' 0"   5\' 0"   Weight 202 lbs 3 oz  198 lbs 13 oz  BMI 39.49  38.83  Systolic 134 130 782  Diastolic 80 82 88  Pulse 76  87    BP 134/80   Pulse 76   Ht 5' (1.524 m)   Wt 202 lb 3.2 oz (91.7 kg)   BMI 39.49 kg/m  Wt Readings from Last 3 Encounters:  11/19/23 202 lb 3.2 oz (91.7 kg)  09/21/23 198 lb 12.8 oz (90.2 kg)  08/20/23 199 lb (90.3 kg)     CMP ( most recent) CMP     Component Value Date/Time   NA 139 11/14/2023 0812   K 4.6 11/14/2023 0812   CL 99 11/14/2023 0812   CO2 23 11/14/2023 0812   GLUCOSE 161 (H) 11/14/2023 0812   GLUCOSE 184 (H) 12/14/2021 0901   BUN 18 11/14/2023 0812   CREATININE 1.32 (H) 11/14/2023 0812   CALCIUM 9.7 11/14/2023 0812   PROT 6.4 11/14/2023 0812   ALBUMIN 3.6 (L) 11/14/2023 0812   AST 14 11/14/2023 0812   ALT 15 11/14/2023 0812   ALKPHOS 139 (H) 11/14/2023 0812   BILITOT 0.2 11/14/2023 0812   GFRNONAA >60 12/14/2021 0901   GFRAA 80 11/11/2020 0817    Diabetic Labs (most recent): Lab Results  Component Value Date   HGBA1C 8.4 (A) 11/19/2023   HGBA1C 7.9 (A) 07/11/2023   HGBA1C 7.7 06/21/2023     Lipid Panel ( most recent) Lipid Panel     Component Value Date/Time   CHOL 212 (H) 11/14/2023 0812   TRIG 328 (H) 11/14/2023 0812   HDL 36 (L) 11/14/2023 0812   CHOLHDL 5.9 (H) 11/14/2023 0812   LDLCALC 119 (H) 11/14/2023 0812   LABVLDL 57 (H) 11/14/2023 0812      Lab Results  Component Value Date   TSH 1.940 11/14/2023   TSH 3.110 04/06/2022   TSH 1.210 10/13/2021   TSH 1.730 01/27/2021   TSH 2.640 11/11/2020   TSH 1.42 09/23/2019   FREET4 1.13 11/14/2023   FREET4 1.25 04/06/2022   FREET4 1.25 01/27/2021   FREET4 1.28 11/11/2020      Assessment & Plan:   1. Type 2 diabetes mellitus with hyperglycemia, with long-term current use of insulin (HCC)  - Christine Weaver has currently  uncontrolled symptomatic type 2 DM since  58 years of age.  Christine Weaver presents with worsening glycemic profile.  Her CGM AGP shows 47% time range, 44% level 1 hyperglycemia, 9% level 2 hyperglycemia.  Her average blood glucose is 186 for the last 14 days.  Her point-of-care A1c is 8.4% increasing from 7.9% during her last visit.    Recent labs reviewed.  - I had a long discussion with her about the progressive nature of diabetes and the pathology behind its complications. -her diabetes is complicated by obesity/sedentary life, smoking and she remains at a high risk for more acute and chronic complications which include CAD, CVA, CKD, retinopathy, and neuropathy. These are all discussed in detail with her.  -  she is advised to stick to a routine mealtimes to eat 3 meals  a day and avoid unnecessary snacks ( to snack only to correct hypoglycemia).  - she acknowledges that there is a room for improvement in her food and drink choices. - Suggestion is made for her to avoid simple carbohydrates  from her diet including Cakes, Sweet Desserts, Ice Cream, Soda (diet and regular), Sweet Tea, Candies, Chips, Cookies, Store Bought Juices, Alcohol in Excess of  1-2 drinks a day, Artificial Sweeteners,  Coffee Creamer, and "Sugar-free" Products, Lemonade. This will help patient to have more stable blood glucose profile and potentially avoid unintended weight gain.    - she will be scheduled with Norm Salt, RDN, CDE for diabetes education.  - I have approached her with the following individualized plan to manage  her diabetes and patient agrees:    She will continue to need multiple modality treatments in order for her to maintain control of diabetes to target.    -She presents with loss of control of glycemia after her prandial insulin was withheld.   -Accordingly, she is advised to continue Lantus 80 units nightly, resume and continue Humalog 10 -16 units 3 times daily AC.   -She is advised to  continue to use her CGM continuously.   - she is warned not to take insulin without proper monitoring per orders.  - she is encouraged to call clinic for blood glucose levels less than 70 or above 200 mg /dl. - she is advised to continue metformin 500 mg p.o. twice daily.   -It was noted she is on duplicate prescription for [place] and Jardiance.  She is advised to hold [place] for now.  She is encouraged to continue Jardiance 10 mg p.o. daily at breakfast and then switch to [place] afterwards instead of taking both at the same time.   -She is tolerating Mounjaro 7.5 mg subcutaneously weekly, however does not want to increase the dose at this time.  - Specific targets for  A1c;  LDL, HDL,  and Triglycerides were discussed with the patient.  2) Blood Pressure /Hypertension:   -Her blood pressure is controlled to target.   she is advised to continue her current medications including lisinopril 20 mg p.o. daily with breakfast .  3) Lipids/Hyperlipidemia:   Review of her recent lipid panel showed uncontrolled LDL at 119, increasing from 104.    She is advised to continue atorvastatin 80 mg p.o. nightly.     Whole food plant-based diet was discussed and recommended to her.   Side effects and precautions discussed with her.  4)  Weight/Diet: Her BMI is 39.49-  clearly complicating her diabetes care.   she is  a candidate for weight loss. I discussed with her the fact that loss of 5 - 10% of her  current body weight will have the most impact on her diabetes management.  Exercise, and detailed carbohydrates information provided  -  detailed on discharge instructions.  5) Chronic Care/Health Maintenance:  -she  is on ACEI/ARB and Statin medications and  is encouraged to initiate and continue to follow up with Ophthalmology, Dentist,  Podiatrist at least yearly or according to recommendations, and advised to  quit smoking. I have recommended yearly flu vaccine and pneumonia vaccine at least every 5  years; moderate intensity exercise for up to 150 minutes weekly; and  sleep for at least 7 hours a day.  Her recent screening ABI was negative for PAD in February 2022.  Her next study will be due in February 2027 or sooner if needed.   - she is  advised to maintain close follow up with Anabel Halon, MD for primary care needs, as well as her other providers for optimal and coordinated care.   I spent  42  minutes in the care of the patient today including review of labs from CMP, Lipids, Thyroid Function, Hematology (current and previous including abstractions from other facilities); face-to-face time discussing  her blood glucose readings/logs, discussing hypoglycemia and hyperglycemia episodes and symptoms, medications doses, her options of short and long term treatment based on the latest standards of care / guidelines;  discussion about incorporating lifestyle medicine;  and documenting the encounter. Risk reduction counseling performed per USPSTF guidelines to reduce  obesity and cardiovascular risk factors.  Please refer to Patient Instructions for Blood Glucose Monitoring and Insulin/Medications Dosing Guide"  in media tab for additional information. Please  also refer to " Patient Self Inventory" in the Media  tab for reviewed elements of pertinent patient history.  Christine Weaver participated in the discussions, expressed understanding, and voiced agreement with the above plans.  All questions were answered to her satisfaction. she is encouraged to contact clinic should she have any questions or concerns prior to her return visit.     Follow up plan: - Return in about 4 months (around 03/18/2024) for Bring Meter/CGM Device/Logs- A1c in Office.  Christine Lunch, MD [facility] [address] [place], [place] [address] Phone: [phone]  Fax: [phone]    11/19/2023, 10:55 AM  This note was partially dictated with voice  recognition software. Similar sounding words can be transcribed inadequately or may not  be corrected upon review.

## 2023-11-27 ENCOUNTER — Other Ambulatory Visit: Payer: Self-pay | Admitting: Internal Medicine

## 2023-11-27 DIAGNOSIS — M5431 Sciatica, right side: Secondary | ICD-10-CM

## 2023-11-30 DIAGNOSIS — M545 Low back pain, unspecified: Secondary | ICD-10-CM | POA: Diagnosis not present

## 2023-11-30 DIAGNOSIS — R202 Paresthesia of skin: Secondary | ICD-10-CM | POA: Diagnosis not present

## 2023-11-30 DIAGNOSIS — Z79891 Long term (current) use of opiate analgesic: Secondary | ICD-10-CM | POA: Diagnosis not present

## 2023-11-30 DIAGNOSIS — G894 Chronic pain syndrome: Secondary | ICD-10-CM | POA: Diagnosis not present

## 2023-11-30 DIAGNOSIS — M5431 Sciatica, right side: Secondary | ICD-10-CM | POA: Diagnosis not present

## 2023-12-17 ENCOUNTER — Other Ambulatory Visit: Payer: Self-pay | Admitting: "Endocrinology

## 2023-12-17 ENCOUNTER — Other Ambulatory Visit: Payer: Self-pay | Admitting: Internal Medicine

## 2023-12-17 DIAGNOSIS — K219 Gastro-esophageal reflux disease without esophagitis: Secondary | ICD-10-CM

## 2023-12-17 DIAGNOSIS — Z794 Long term (current) use of insulin: Secondary | ICD-10-CM

## 2023-12-18 DIAGNOSIS — J449 Chronic obstructive pulmonary disease, unspecified: Secondary | ICD-10-CM | POA: Diagnosis not present

## 2023-12-18 DIAGNOSIS — I509 Heart failure, unspecified: Secondary | ICD-10-CM | POA: Diagnosis not present

## 2023-12-21 ENCOUNTER — Other Ambulatory Visit: Payer: Self-pay | Admitting: Internal Medicine

## 2023-12-31 ENCOUNTER — Other Ambulatory Visit: Payer: Self-pay | Admitting: Internal Medicine

## 2023-12-31 DIAGNOSIS — M5431 Sciatica, right side: Secondary | ICD-10-CM

## 2024-01-01 DIAGNOSIS — G894 Chronic pain syndrome: Secondary | ICD-10-CM | POA: Diagnosis not present

## 2024-01-01 DIAGNOSIS — M546 Pain in thoracic spine: Secondary | ICD-10-CM | POA: Diagnosis not present

## 2024-01-01 DIAGNOSIS — M5432 Sciatica, left side: Secondary | ICD-10-CM | POA: Diagnosis not present

## 2024-01-01 DIAGNOSIS — Z79891 Long term (current) use of opiate analgesic: Secondary | ICD-10-CM | POA: Diagnosis not present

## 2024-01-01 DIAGNOSIS — M545 Low back pain, unspecified: Secondary | ICD-10-CM | POA: Diagnosis not present

## 2024-01-09 ENCOUNTER — Other Ambulatory Visit: Payer: Self-pay | Admitting: "Endocrinology

## 2024-01-09 DIAGNOSIS — E119 Type 2 diabetes mellitus without complications: Secondary | ICD-10-CM

## 2024-01-10 DIAGNOSIS — R809 Proteinuria, unspecified: Secondary | ICD-10-CM | POA: Diagnosis not present

## 2024-01-10 DIAGNOSIS — D631 Anemia in chronic kidney disease: Secondary | ICD-10-CM | POA: Diagnosis not present

## 2024-01-10 DIAGNOSIS — N189 Chronic kidney disease, unspecified: Secondary | ICD-10-CM | POA: Diagnosis not present

## 2024-01-14 IMAGING — MG MM DIGITAL DIAGNOSTIC UNILAT*R* W/ TOMO W/ CAD
6 series · 6 of 18 positions shown · non-contrast
Comparison: Previous exam(s).

CLINICAL DATA: 56-year-old female presenting for evaluation a
tender thickened erythematous site just lateral to the right nipple.
She was placed on antibiotics last week, but has not noticed any
improvement.

EXAM:
DIGITAL DIAGNOSTIC UNILATERAL RIGHT MAMMOGRAM WITH TOMOSYNTHESIS AND
CAD; ULTRASOUND RIGHT BREAST LIMITED
TECHNIQUE: Right digital diagnostic mammography and breast tomosynthesis was
performed. The images were evaluated with computer-aided detection.;
Targeted ultrasound examination of the right breast was performed

[R CC synth-2D (1 of 2)]
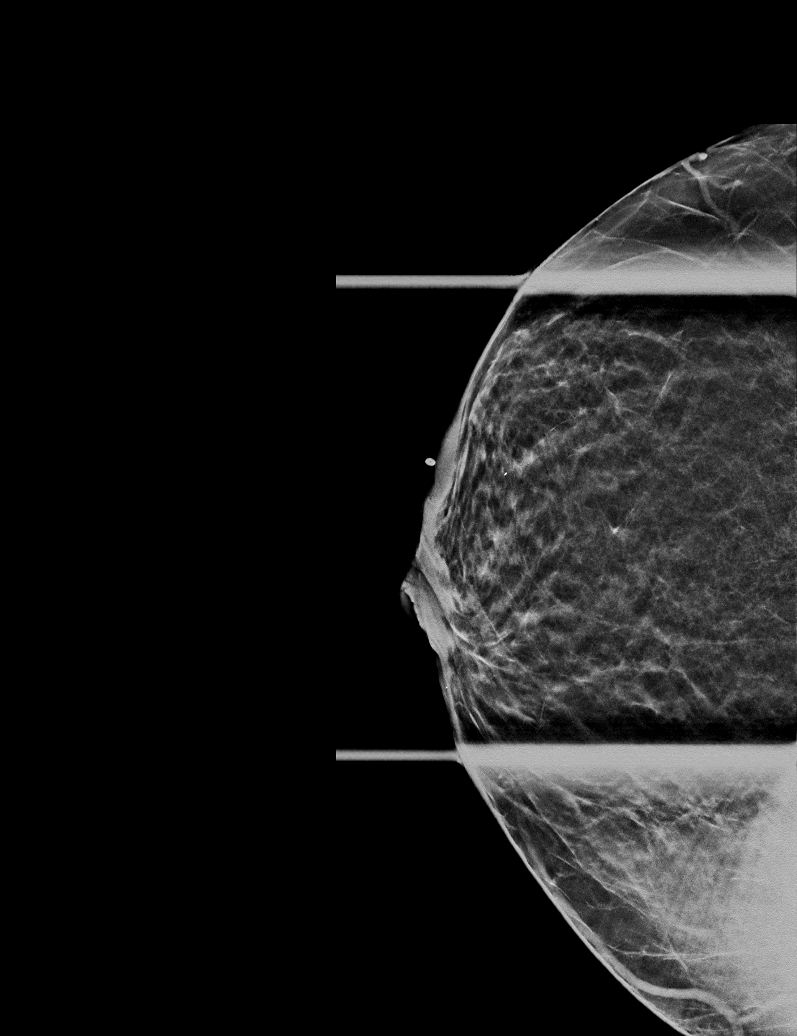

[R CC synth-2D (2 of 2)]
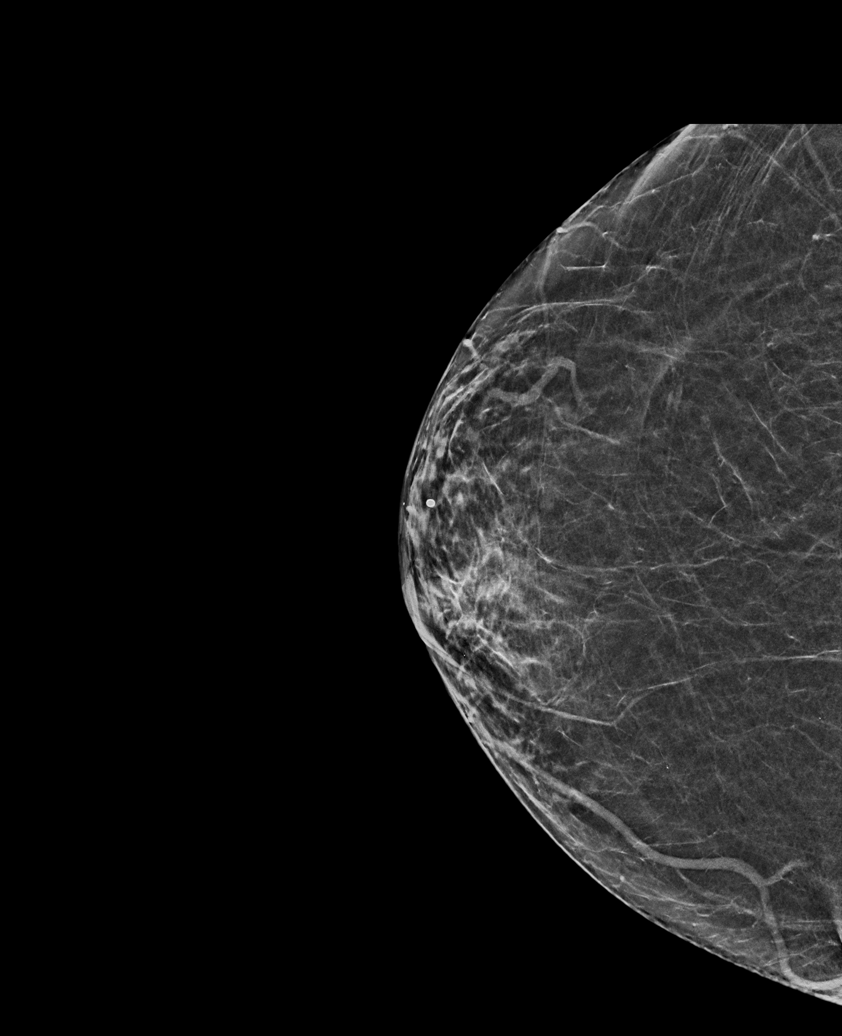

[R MLO synth-2D]
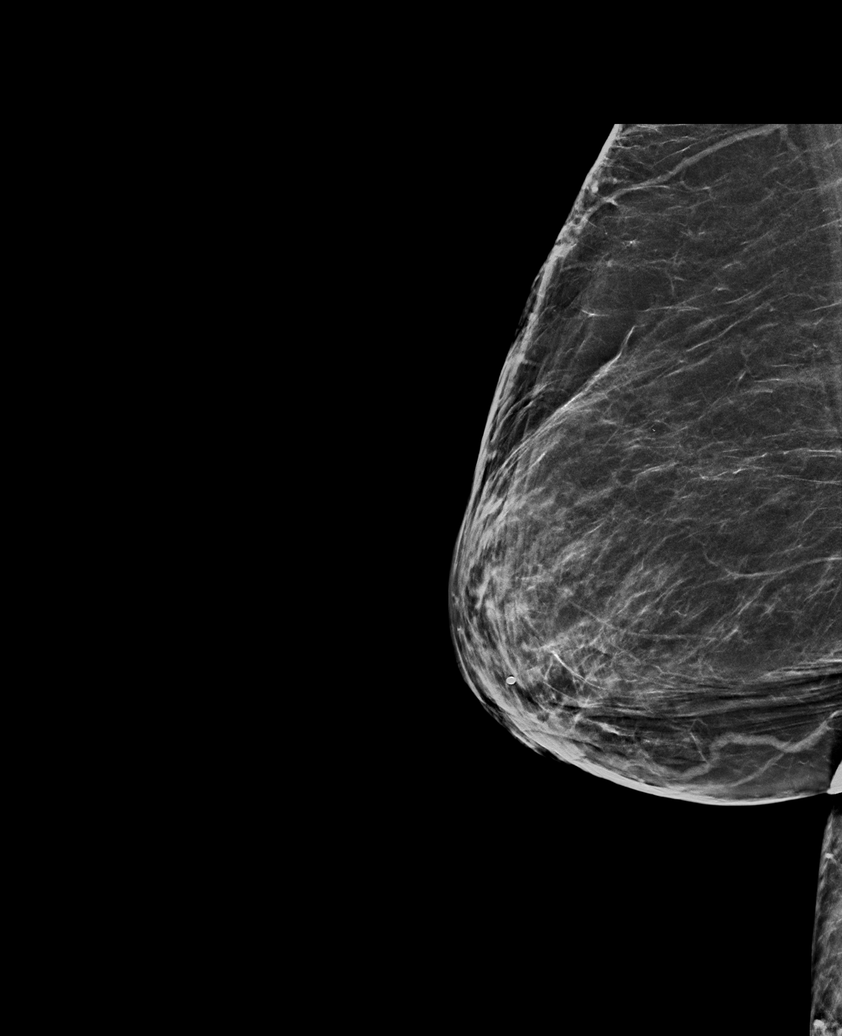

[R CC tomo (1 of 2) · tomo slice 26/51.0]
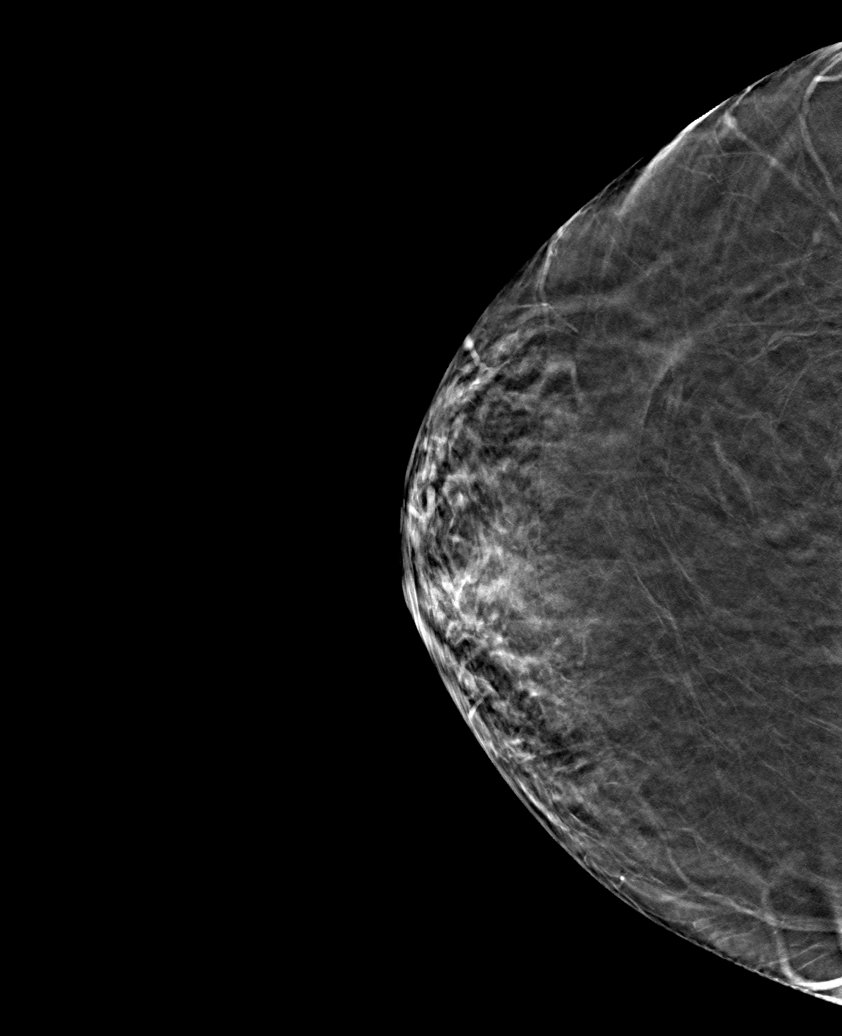

[R CC tomo (2 of 2) · tomo slice 19/37.0]
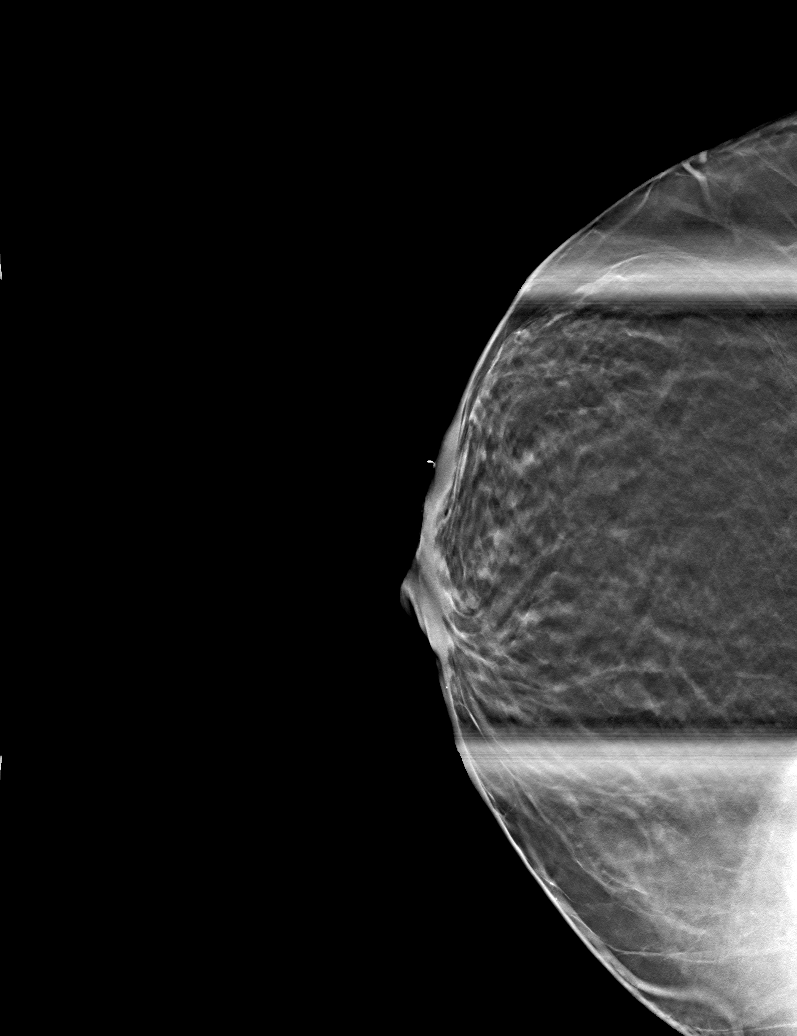

[R MLO tomo · tomo slice 33/64.0]
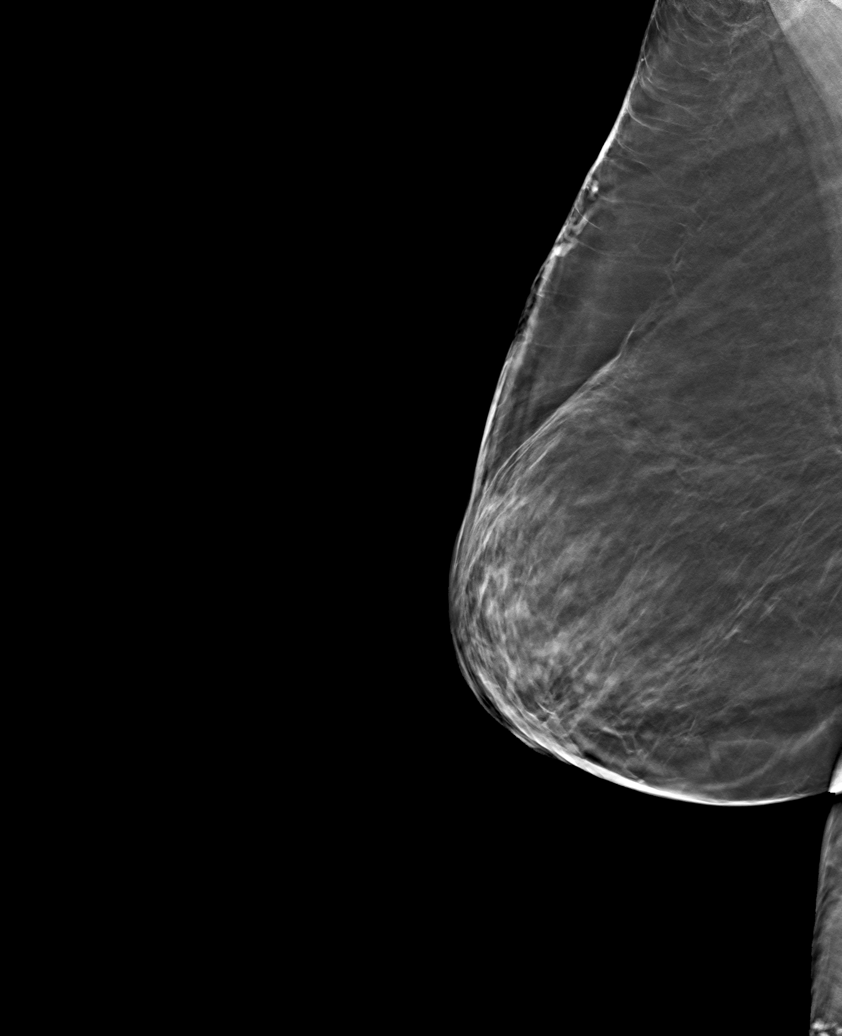

[6 of 18 positions shown; findings below may reference images not displayed]

ACR Breast Density Category b: There are scattered areas of
fibroglandular density.
FINDINGS: A BB has been placed at the symptomatic site just lateral to the
right nipple. There is some skin thickening at the skin marker, but
no masses or other mammographic abnormalities are seen deep to the
BB. No suspicious calcifications, masses or areas of distortion are
seen in the bilateral breasts.

Physical exam demonstrates a focal palpable area of thickened smooth
reddish discoloration.

Ultrasound of the periareolar lateral right breast demonstrates
focal skin thickening. No suspicious masses are found within the
breast tissue.
IMPRESSION: 1. The palpable discoloration along the lateral periareolar right
breast is compatible with a sebaceous cyst. This is a benign
finding.

2.  No mammographic evidence of malignancy in the bilateral breasts.

RECOMMENDATION:
1.  Clinical follow-up as needed for the sebaceous cyst.

2.  Screening mammogram in one year.(Code:K4-L-3YQ)

I have discussed the findings and recommendations with the patient.
If applicable, a reminder letter will be sent to the patient
regarding the next appointment.

BI-RADS CATEGORY  2: Benign.

## 2024-01-14 IMAGING — US US BREAST*R* LIMITED INC AXILLA
1 series · 6 of 6 positions shown · non-contrast
Comparison: Previous exam(s).

CLINICAL DATA: 56-year-old female presenting for evaluation a
tender thickened erythematous site just lateral to the right nipple.
She was placed on antibiotics last week, but has not noticed any
improvement.

EXAM:
DIGITAL DIAGNOSTIC UNILATERAL RIGHT MAMMOGRAM WITH TOMOSYNTHESIS AND
CAD; ULTRASOUND RIGHT BREAST LIMITED
TECHNIQUE: Right digital diagnostic mammography and breast tomosynthesis was
performed. The images were evaluated with computer-aided detection.;
Targeted ultrasound examination of the right breast was performed

[Series 1: us breast*right* limited inc axilla · 0.06mm/px · 6 of 6 slices shown]
[im 1/6]
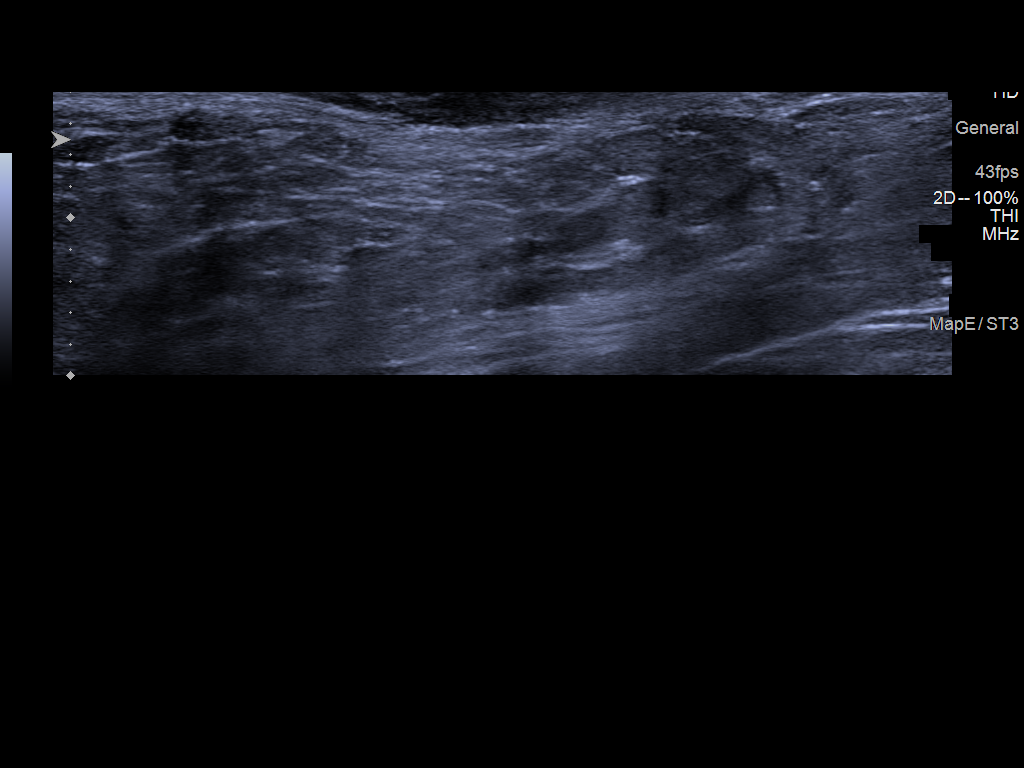
[im 2/6]
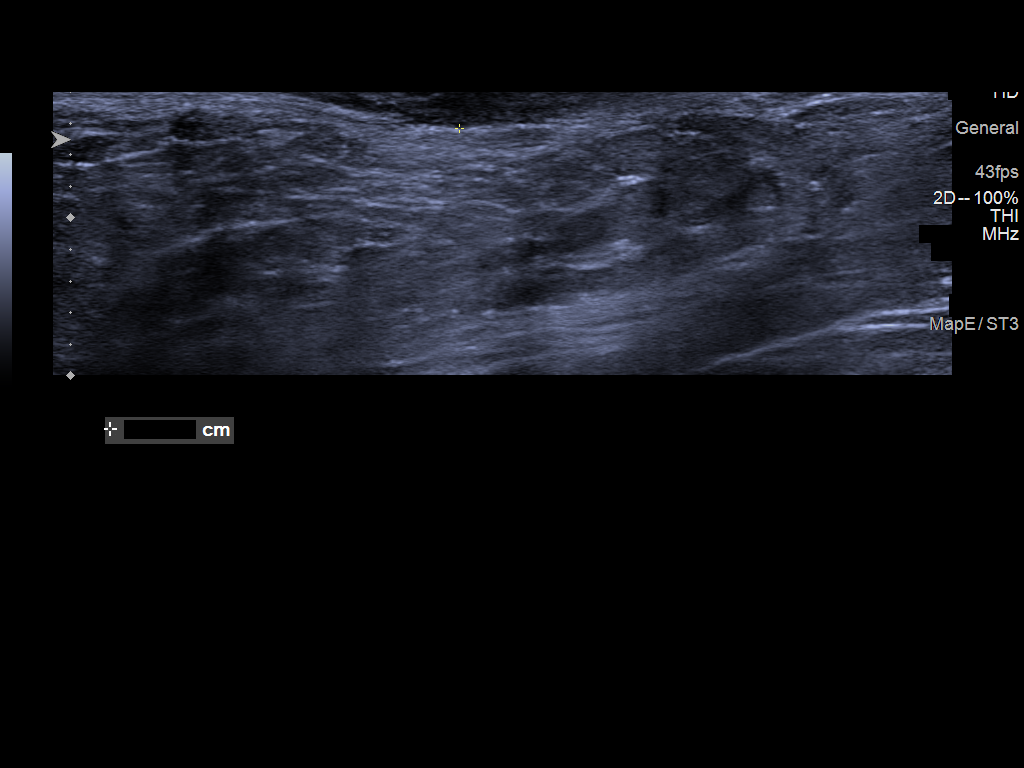
[im 3/6]
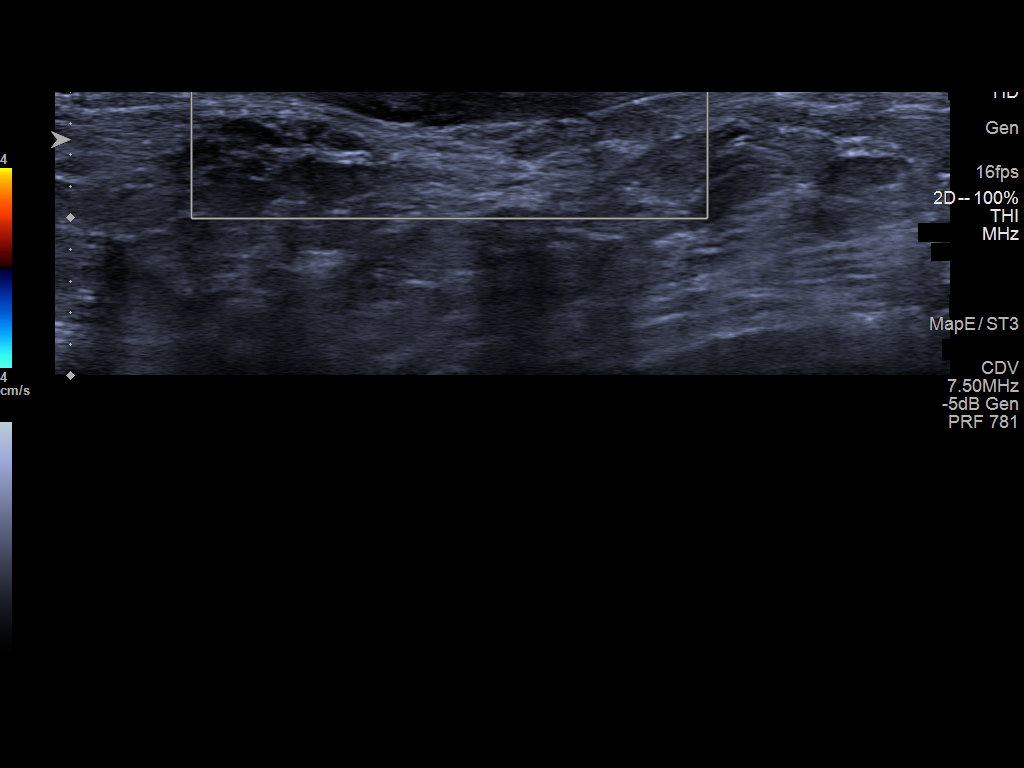
[im 4/6]
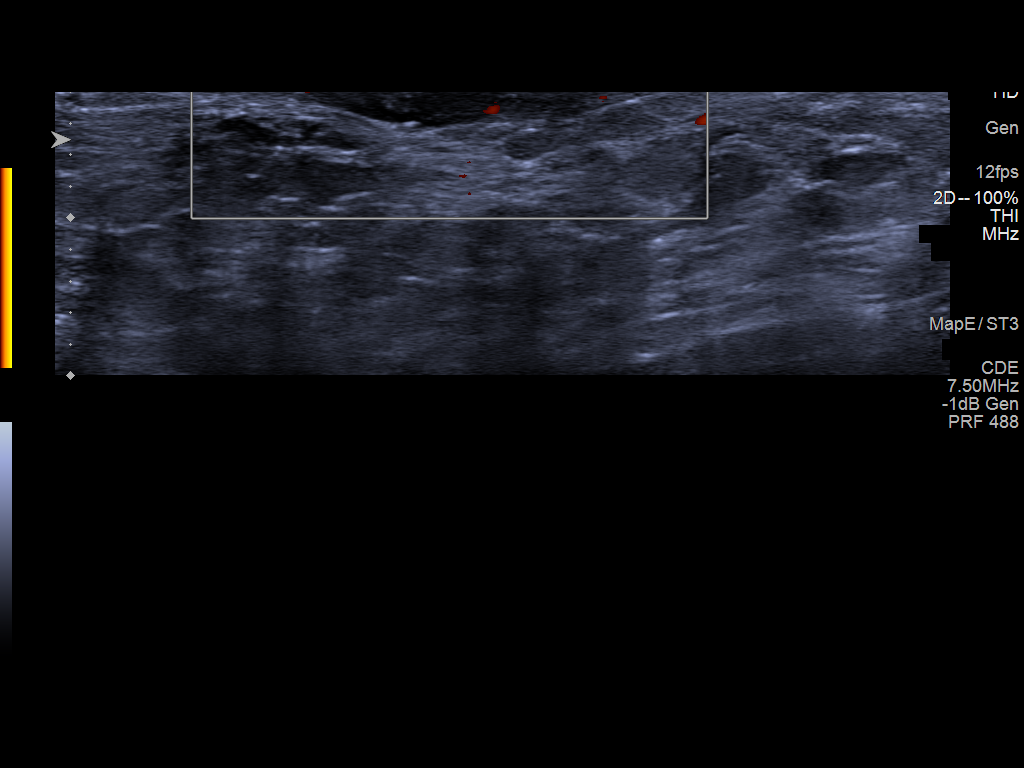
[im 5/6]
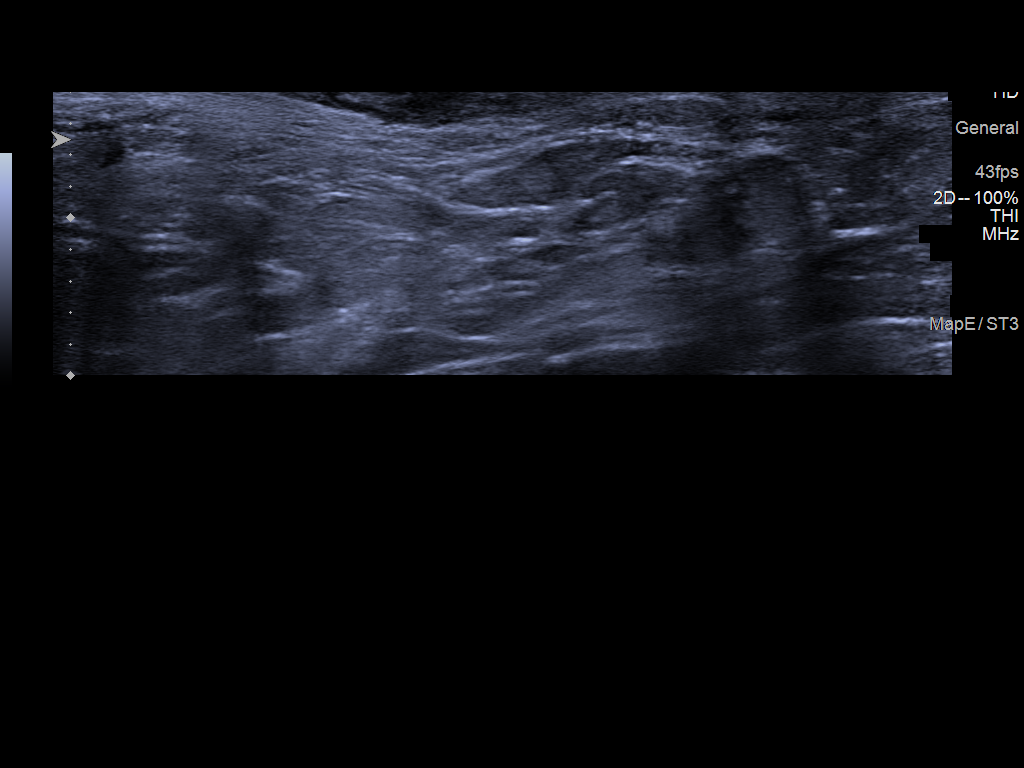
[im 6/6]
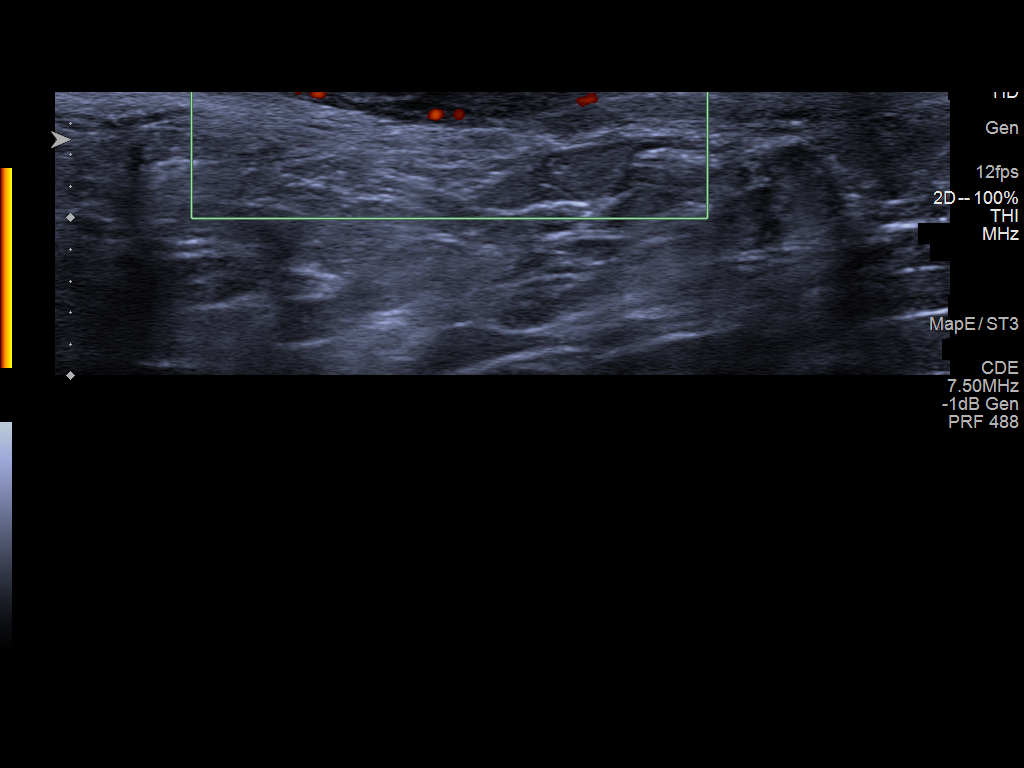

[6 of 6 positions shown; findings below may reference images not displayed]

ACR Breast Density Category b: There are scattered areas of
fibroglandular density.
FINDINGS: A BB has been placed at the symptomatic site just lateral to the
right nipple. There is some skin thickening at the skin marker, but
no masses or other mammographic abnormalities are seen deep to the
BB. No suspicious calcifications, masses or areas of distortion are
seen in the bilateral breasts.

Physical exam demonstrates a focal palpable area of thickened smooth
reddish discoloration.

Ultrasound of the periareolar lateral right breast demonstrates
focal skin thickening. No suspicious masses are found within the
breast tissue.
IMPRESSION: 1. The palpable discoloration along the lateral periareolar right
breast is compatible with a sebaceous cyst. This is a benign
finding.

2.  No mammographic evidence of malignancy in the bilateral breasts.

RECOMMENDATION:
1.  Clinical follow-up as needed for the sebaceous cyst.

2.  Screening mammogram in one year.(Code:K4-L-3YQ)

I have discussed the findings and recommendations with the patient.
If applicable, a reminder letter will be sent to the patient
regarding the next appointment.

BI-RADS CATEGORY  2: Benign.

## 2024-01-15 DIAGNOSIS — I509 Heart failure, unspecified: Secondary | ICD-10-CM | POA: Diagnosis not present

## 2024-01-15 DIAGNOSIS — J449 Chronic obstructive pulmonary disease, unspecified: Secondary | ICD-10-CM | POA: Diagnosis not present

## 2024-01-16 DIAGNOSIS — E1122 Type 2 diabetes mellitus with diabetic chronic kidney disease: Secondary | ICD-10-CM | POA: Diagnosis not present

## 2024-01-16 DIAGNOSIS — E1129 Type 2 diabetes mellitus with other diabetic kidney complication: Secondary | ICD-10-CM | POA: Diagnosis not present

## 2024-01-16 DIAGNOSIS — N1831 Chronic kidney disease, stage 3a: Secondary | ICD-10-CM | POA: Diagnosis not present

## 2024-01-16 DIAGNOSIS — I5032 Chronic diastolic (congestive) heart failure: Secondary | ICD-10-CM | POA: Diagnosis not present

## 2024-01-19 ENCOUNTER — Other Ambulatory Visit: Payer: Self-pay | Admitting: Internal Medicine

## 2024-01-19 DIAGNOSIS — M5431 Sciatica, right side: Secondary | ICD-10-CM

## 2024-02-01 DIAGNOSIS — Z79891 Long term (current) use of opiate analgesic: Secondary | ICD-10-CM | POA: Diagnosis not present

## 2024-02-01 DIAGNOSIS — G894 Chronic pain syndrome: Secondary | ICD-10-CM | POA: Diagnosis not present

## 2024-02-01 DIAGNOSIS — E1142 Type 2 diabetes mellitus with diabetic polyneuropathy: Secondary | ICD-10-CM | POA: Diagnosis not present

## 2024-02-01 DIAGNOSIS — M545 Low back pain, unspecified: Secondary | ICD-10-CM | POA: Diagnosis not present

## 2024-02-01 DIAGNOSIS — M542 Cervicalgia: Secondary | ICD-10-CM | POA: Diagnosis not present

## 2024-02-02 ENCOUNTER — Other Ambulatory Visit: Payer: Self-pay | Admitting: Internal Medicine

## 2024-02-02 DIAGNOSIS — M5431 Sciatica, right side: Secondary | ICD-10-CM

## 2024-02-03 ENCOUNTER — Other Ambulatory Visit: Payer: Self-pay | Admitting: "Endocrinology

## 2024-02-03 DIAGNOSIS — E119 Type 2 diabetes mellitus without complications: Secondary | ICD-10-CM

## 2024-02-15 DIAGNOSIS — J449 Chronic obstructive pulmonary disease, unspecified: Secondary | ICD-10-CM | POA: Diagnosis not present

## 2024-02-15 DIAGNOSIS — I509 Heart failure, unspecified: Secondary | ICD-10-CM | POA: Diagnosis not present

## 2024-02-17 NOTE — Progress Notes (Unsigned)
 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  Quit work 2004 at 220 lb and gained up 250 until husband died in 10/02/20  After moving to [facility] 2012 saw [facility] doctor feeling sluggish and dx with asthma > meds didn't help much then started doe which seemed better with alb neb then added advair around 2014/15 then 02     History of Present Illness  06/30/2021  Pulmonary/ 1st office eval/ Christine Weaver / [place] [facility]  Chief Complaint  Patient presents with   Follow-up    Pt. Is on 2L O2 when sleeping and out running errands but tries not to wear it in the house unless she needs it. Coughing but not normally coughing up mucus.   Dyspnea:  foodlion 50% using 02 2lpm/  Cough: always coughing some very hoarse x years >minimally productive  Sleep: 45 degrees with pillows  SABA use: no longer needing since 02  02 :  2lpm at bedtime and prn daytime  Intol of dpi  Rec Omeprazole  40 mg Take  30-60 min before first meal of the day and Pepcid  (famotidine )  20 mg after supper until return to office - this is the best way to tell whether stomach acid is contributing to your problem.   GERD rx Stop advair and start symbicort  80 Take 2 puffs first thing in am and then another 2 puffs about 12 hours later.  Work on inhaler technique: Aaron Aas  Make sure you check your oxygen  saturation  at your highest level of activity   Please schedule a follow up office visit in 6-8  weeks, call sooner if needed with pfts on return   08/30/2021  f/u ov/[name] office/Christine Weaver re:  maint on symbicort  80 2bid     Chief Complaint  Patient presents with   Follow-up    2 lpm all of the time. Persistent dry cough. Feels inhaler is helping.   Dyspnea:   push mower x 20 min s 02  says after stopped 89% but not checking  while  exerting  Cough: dry  Sleeping: flat bed 45 degrees SABA use: minimal  02: 2lpm hs and prn  Covid status: vax 3  Lung cancer screening: referred  Rec We will have our lung screening nurse practioner call you to set  up follow up CT not due til Feb 2023 Make sure you check your oxygen  saturation  at your highest level of activity  to be sure it stays over 90% Keep your appt for your PFTs  Please schedule a follow up visit in 6  months but call sooner if needed  Late add not on amlodipine  or lisinopril  so started avapro  75 mg daily     03/06/2022  f/u ov/ office/Christine Weaver re: COPD/AB maint on symbicort  80 2bid   Chief Complaint  Patient presents with   Follow-up    Since allergy season has started patient has been having to use portable O2 while out   Dyspnea:  says can walk the whole store@ [employer] or [employer] / mall walking slower than others = MMRC2 = can't walk a nl pace on a flat grade s sob but does fine slow and flat eg  Cough: min mucoid at hs but not does not keep her up  once asleep Sleeping: 45 degrees with pillows / sleeps with cat  SABA use: up to 3 x daily  due to "allergy season" 02: 2lpm NP when  "cat wakes her up because 02 drops"  Covid status: x 3  Lung cancer screening: due  Rec I will be referring you to ENT for chronic hoarseness > not done as of 02/19/2023  We will be calling to arrange for you to start lung cancer screening  We will be scheduling PFTs  in [place] either when you go for ENT or pain management  Work on inhaler technique:   Please schedule a follow up visit in 6 months but call sooner if needed       02/19/2023  f/u ov/[place] office/Christine Weaver re: AB GOLD 0  maint on symbicort  80   Chief Complaint  Patient presents with   Follow-up    Pt f/u states that she is feeling well other than having allergy problems  Dyspnea:  working out with treadmill 4 x weekly x 30 min at top speed 1.2 mph x incline all the way up with doe s 02 or monitoring  sats as rec  Cough: with pollen/ min prod white mucus  Sleeping: flat bed pillows  SABA use: 2x per week 02: 2lpm/  prn daytime  Rec For allergies > zyrtec at bedtime will cover 24 hours  Make sure you check your oxygen  saturation  AT  your highest level of activity (not after you stop)   to be sure it stays over 90%  My office will be contacting you by phone for referral to ENT   - if you don't hear back from my office within one week please call us  back or notify us  thru MyChart and we'll address it right away.    08/20/2023  31m f/u ov/[place] office/Christine Weaver re: GOLD 0/AB  maint on symbiocrt 80 and at least 3 x yearly  with uri  and allergy fall  > spring with rec benadryl per [facility] allergist  Chief Complaint  Patient presents with   Hoarse  Dyspnea:  working out on 02 2lpm POC on treadmill x 30 min  Cough: ok / still hoarse  Sleeping: flat bed/ 2 pillows s    resp cc  SABA use: none now / need solution  02: 1.5 lpm hs and prn Lung cancer screening:due q June    02/18/2024  f/u ov/[place] office/Christine Weaver re: *** maint on ***  No chief complaint on file.   Dyspnea:  *** Cough: *** Sleeping: ***   resp cc  SABA use: *** 02: ***  Lung cancer screening: ***   No obvious day to day or daytime variability or assoc excess/ purulent sputum or mucus plugs or hemoptysis or cp or chest tightness, subjective wheeze or overt sinus or hb symptoms.    Also denies any obvious fluctuation of symptoms with weather or environmental changes or other aggravating or alleviating factors except as outlined above   No unusual exposure hx or h/o childhood pna/ asthma or knowledge of premature birth.  Current Allergies, Complete Past Medical History, Past Surgical History, Family History, and Social History were reviewed in [employer] record.  ROS  The following are not active complaints unless bolded Hoarseness, sore throat, dysphagia, dental problems, itching, sneezing,   nasal congestion or discharge of excess mucus or purulent secretions, ear ache,   fever, chills, sweats, unintended wt loss or wt gain, classically pleuritic or exertional cp,  orthopnea pnd or arm/hand  swelling  or leg swelling, presyncope, palpitations, abdominal pain, anorexia, nausea, vomiting, diarrhea  or change in bowel habits or change in bladder habits, change in stools or change in urine, dysuria, hematuria,  rash, arthralgias, visual complaints, headache, numbness, weakness or ataxia or problems with walking or coordination,  change in mood or  memory.        Current Meds  Medication Sig   BOOSTRIX  5-2.5-18.5 LF-MCG/0.5 injection    FARXIGA  10 MG TABS tablet Take 10 mg by mouth every morning.   FARXIGA  5 MG TABS tablet Take 5 mg by mouth every morning.   telmisartan (MICARDIS) 20 MG tablet Take 20 mg by mouth 2 (two) times daily.              Past Medical History:  Diagnosis Date   Asthma    COPD (chronic obstructive pulmonary disease) (HCC)    Hyperlipidemia    Hypertension    Type 2 diabetes mellitus (HCC)         Objective:    Wts  02/18/2024        ***  08/20/2023     199  02/19/2023       206 11/16/2022       200  03/06/2022         206   08/30/21 212 lb (96.2 kg)  07/11/21 209 lb 6.4 oz (95 kg)  06/30/21 210 lb 1.6 oz (95.3 kg)    Vital signs reviewed  02/18/2024  - Note at rest 02 sats  ***% on ***   General appearance:    ***     edentulous***             Assessment

## 2024-02-18 ENCOUNTER — Other Ambulatory Visit: Payer: Self-pay | Admitting: Cardiology

## 2024-02-18 ENCOUNTER — Ambulatory Visit (INDEPENDENT_AMBULATORY_CARE_PROVIDER_SITE_OTHER): Payer: 59 | Admitting: Internal Medicine

## 2024-02-18 ENCOUNTER — Encounter: Payer: Self-pay | Admitting: Internal Medicine

## 2024-02-18 ENCOUNTER — Other Ambulatory Visit: Payer: Self-pay | Admitting: Internal Medicine

## 2024-02-18 VITALS — BP 111/67 | HR 103 | Ht 60.0 in | Wt 203.5 lb

## 2024-02-18 DIAGNOSIS — M5431 Sciatica, right side: Secondary | ICD-10-CM

## 2024-02-18 DIAGNOSIS — R490 Dysphonia: Secondary | ICD-10-CM | POA: Diagnosis not present

## 2024-02-18 DIAGNOSIS — J4489 Other specified chronic obstructive pulmonary disease: Secondary | ICD-10-CM

## 2024-02-18 DIAGNOSIS — Z87891 Personal history of nicotine dependence: Secondary | ICD-10-CM

## 2024-02-18 DIAGNOSIS — J9611 Chronic respiratory failure with hypoxia: Secondary | ICD-10-CM | POA: Diagnosis not present

## 2024-02-18 NOTE — Patient Instructions (Addendum)
 My office will be contacting you by phone for referral to [facility]   - if you don't hear back from my office within one week please call us  back or notify us  thru MyChart and we'll address it right away.     Work on inhaler technique:  relax and gently blow all the way out then take a nice smooth full deep breath back in, triggering the inhaler at same time you start breathing in.  Hold breath in for at least  5 seconds if you can. Blow out symbicort   thru nose. Rinse and gargle with water  when done.  If mouth or throat bother you at all,  try brushing teeth/gums/tongue with arm and hammer toothpaste/ make a slurry and gargle and spit out.        Please schedule a follow up visit in 6 months but call sooner if needed

## 2024-02-18 NOTE — Assessment & Plan Note (Signed)
 Quit smoking 09/2020/MM - try off acei 06/30/2021  - change advair to symb 80 2bid and max rx for gerd  - alpha one  06/30/21    MM   Level  142 - Allergy profile 06/30/21  >  Eos 0.1 /  IgE  124 -Chest LDSCT   04/25/22  mild centrilobular emphysema/bronchiolitis - PFT's  11/27/22  FEV1 1.97 (82 % ) ratio 0.81  p 20 % improvement from saba p 0 prior to study with DLCO  14.06 (83%)   and FV curve mildly concave and ERV 56% at wt 195   - 08/20/2023  continue symbiort 80 with 160 for flares (hoarseness may limit ICS)   >>>  02/18/2024  After extensive coaching inhaler device,  effectiveness =    50% (poor insp) > rec train with empty inhaler but no change rx

## 2024-02-18 NOTE — Assessment & Plan Note (Addendum)
 Quit smoking 2021  -  Referred for LCS started in June 2024   >>> rec repeat q June unless otherwise directed x 2036

## 2024-02-18 NOTE — Assessment & Plan Note (Addendum)
 sats 97% at rest 08/30/2021   - as of 02/18/2024  1.5 lpm hs and prn daytime but c/o nasal dryness   >>> advised on humidification for 02/ keep sats > 90% with exercise ideally to help burn fat/ easier to lose wt that way          Each maintenance medication was reviewed in detail including emphasizing most importantly the difference between maintenance and prns and under what circumstances the prns are to be triggered using an action plan format where appropriate.  Total time for H and P, chart review, counseling, reviewing hfa/ 02/pulseo ox  device(s) and generating customized AVS unique to this office visit / same day charting = 30 min

## 2024-02-19 ENCOUNTER — Other Ambulatory Visit: Payer: Self-pay | Admitting: Internal Medicine

## 2024-02-19 DIAGNOSIS — E7849 Other hyperlipidemia: Secondary | ICD-10-CM

## 2024-02-19 DIAGNOSIS — M5431 Sciatica, right side: Secondary | ICD-10-CM

## 2024-02-19 DIAGNOSIS — E1165 Type 2 diabetes mellitus with hyperglycemia: Secondary | ICD-10-CM | POA: Diagnosis not present

## 2024-02-25 ENCOUNTER — Encounter (INDEPENDENT_AMBULATORY_CARE_PROVIDER_SITE_OTHER): Payer: Self-pay | Admitting: Otolaryngology

## 2024-02-29 DIAGNOSIS — M546 Pain in thoracic spine: Secondary | ICD-10-CM | POA: Diagnosis not present

## 2024-02-29 DIAGNOSIS — M542 Cervicalgia: Secondary | ICD-10-CM | POA: Diagnosis not present

## 2024-02-29 DIAGNOSIS — Z79891 Long term (current) use of opiate analgesic: Secondary | ICD-10-CM | POA: Diagnosis not present

## 2024-02-29 DIAGNOSIS — G894 Chronic pain syndrome: Secondary | ICD-10-CM | POA: Diagnosis not present

## 2024-02-29 DIAGNOSIS — M545 Low back pain, unspecified: Secondary | ICD-10-CM | POA: Diagnosis not present

## 2024-03-03 ENCOUNTER — Other Ambulatory Visit: Payer: Self-pay | Admitting: Internal Medicine

## 2024-03-03 DIAGNOSIS — K219 Gastro-esophageal reflux disease without esophagitis: Secondary | ICD-10-CM

## 2024-03-03 MED ORDER — OMEPRAZOLE 40 MG PO CPDR: 40.0000 mg | DELAYED_RELEASE_CAPSULE | Freq: Every day | ORAL | 1 refills | Status: DC

## 2024-03-03 NOTE — Telephone Encounter (Signed)
 Copied from CRM [id number]. Topic: Clinical - Medication Refill >> Mar 03, 2024 12:30 PM Eveleen Hinds B wrote: Most Recent Primary Care Visit:  Provider: Abigail Hoff A  Department: [facility]  Visit Type: MEDICARE AWV, SEQUENTIAL  Date: 07/25/2023  Medication: omeprazole  (PRILOSEC) 40 MG capsule  Has the patient contacted their pharmacy? Yes (Agent: If no, request that the patient contact the pharmacy for the refill. If patient does not wish to contact the pharmacy document the reason why and proceed with request.) (Agent: If yes, when and what did the pharmacy advise?)  Is this the correct pharmacy for this prescription? Yes If no, delete pharmacy and type the correct one.  This is the patient's preferred pharmacy:  [id number] - [place], [place] - [address] AT [facility] [address]  [place] [address] Phone: [phone] Fax: [phone]   Has the prescription been filled recently? Yes  Is the patient out of the medication? Yes  Has the patient been seen for an appointment in the last year OR does the patient have an upcoming appointment? Yes  Can we respond through MyChart? Yes  Agent: Please be advised that Rx refills may take up to 3 business days. We ask that you follow-up with your pharmacy.

## 2024-03-04 ENCOUNTER — Other Ambulatory Visit: Payer: Self-pay | Admitting: "Endocrinology

## 2024-03-04 DIAGNOSIS — E119 Type 2 diabetes mellitus without complications: Secondary | ICD-10-CM

## 2024-03-13 ENCOUNTER — Encounter (INDEPENDENT_AMBULATORY_CARE_PROVIDER_SITE_OTHER): Payer: Self-pay

## 2024-03-16 DIAGNOSIS — J449 Chronic obstructive pulmonary disease, unspecified: Secondary | ICD-10-CM | POA: Diagnosis not present

## 2024-03-16 DIAGNOSIS — I509 Heart failure, unspecified: Secondary | ICD-10-CM | POA: Diagnosis not present

## 2024-03-19 ENCOUNTER — Encounter: Payer: Self-pay | Admitting: "Endocrinology

## 2024-03-19 ENCOUNTER — Ambulatory Visit (INDEPENDENT_AMBULATORY_CARE_PROVIDER_SITE_OTHER): Payer: 59 | Admitting: "Endocrinology

## 2024-03-19 VITALS — BP 142/82 | HR 96 | Ht 60.0 in | Wt 199.8 lb

## 2024-03-19 DIAGNOSIS — I10 Essential (primary) hypertension: Secondary | ICD-10-CM | POA: Diagnosis not present

## 2024-03-19 DIAGNOSIS — E119 Type 2 diabetes mellitus without complications: Secondary | ICD-10-CM

## 2024-03-19 DIAGNOSIS — E782 Mixed hyperlipidemia: Secondary | ICD-10-CM | POA: Diagnosis not present

## 2024-03-19 DIAGNOSIS — Z794 Long term (current) use of insulin: Secondary | ICD-10-CM | POA: Diagnosis not present

## 2024-03-19 LAB — POCT UA - MICROALBUMIN
Albumin/Creatinine Ratio, Urine, POC: >300
Creatinine, POC: 100 mg/dL
Microalbumin Ur, POC: 150 mg/L

## 2024-03-19 LAB — POCT GLYCOSYLATED HEMOGLOBIN (HGB A1C): HbA1c, POC (controlled diabetic range): 7.4 % — AB (ref 0.0–7.0)

## 2024-03-19 MED ORDER — TIRZEPATIDE 10 MG/0.5ML ~~LOC~~ SOAJ: 10.0000 mg | SUBCUTANEOUS | 1 refills | Status: DC

## 2024-03-19 MED ORDER — LANTUS SOLOSTAR 100 UNIT/ML ~~LOC~~ SOPN: 70.0000 [IU] | PEN_INJECTOR | Freq: Every day | SUBCUTANEOUS | 1 refills | Status: DC

## 2024-03-19 NOTE — Progress Notes (Signed)
 03/19/2024, 9:36 AM  Endocrinology follow-up note   Subjective:    Patient ID: Christine Weaver, female    DOB: [birth year].  Christine Weaver is being seen in follow-up after she was seen in consultation for management of currently uncontrolled symptomatic diabetes requested by  Meldon Sport, MD.   Past Medical History:  Diagnosis Date   Asthma    Bell's palsy    COPD (chronic obstructive pulmonary disease) (HCC)    Hyperlipidemia    Hypertension    Lyme disease    Type 2 diabetes mellitus (HCC)     Past Surgical History:  Procedure Laterality Date   CESAREAN SECTION     COLONOSCOPY WITH PROPOFOL  N/A 12/16/2021   Procedure: COLONOSCOPY WITH PROPOFOL ;  Surgeon: Urban Garden, MD;  Location: [facility];  Service: Gastroenterology;  Laterality: N/A;  945   HOT HEMOSTASIS  12/16/2021   Procedure: HOT HEMOSTASIS (ARGON PLASMA COAGULATION/BICAP);  Surgeon: Umberto Ganong, Bearl Limes, MD;  Location: [facility];  Service: Gastroenterology;;   POLYPECTOMY  12/16/2021   Procedure: POLYPECTOMY;  Surgeon: Urban Garden, MD;  Location: [facility];  Service: Gastroenterology;;   RENAL BIOPSY  06/02/2022   SUBMUCOSAL TATTOO INJECTION  12/16/2021   Procedure: SUBMUCOSAL TATTOO INJECTION;  Surgeon: Urban Garden, MD;  Location: [facility];  Service: Gastroenterology;;    Social History   Socioeconomic History   Marital status: Widowed    Spouse name: Not on file   Number of children: Not on file   Years of education: Not on file   Highest education level: 12th grade  Occupational History   Not on file  Tobacco Use   Smoking status: Former    Current packs/day: 0.00    Types: Cigarettes    Quit date: 10/29/2020    Years since quitting: 3.3   Smokeless tobacco: Never  Vaping Use   Vaping status: Never Used  Substance and Sexual Activity   Alcohol use: Never   Drug use: Never   Sexual activity: Not on file   Other Topics Concern   Not on file  Social History Narrative   Not on file   Social Drivers of Health   Financial Resource Strain: Low Risk  (07/25/2023)   Overall Financial Resource Strain (CARDIA)    Difficulty of Paying Living Expenses: Not hard at all  Food Insecurity: No Food Insecurity (07/25/2023)   Hunger Vital Sign    Worried About Running Out of Food in the Last Year: Never true    Ran Out of Food in the Last Year: Never true  Transportation Needs: No Transportation Needs (07/25/2023)   PRAPARE - Administrator, Civil Service (Medical): No    Lack of Transportation (Non-Medical): No  Physical Activity: Sufficiently Active (07/25/2023)   Exercise Vital Sign    Days of Exercise per Week: 7 days    Minutes of Exercise per Session: 30 min  Stress: No Stress Concern Present (07/25/2023)   [employer] of Occupational Health - Occupational Stress Questionnaire    Feeling of Stress : Not at all  Social Connections: Unknown (07/25/2023)   Social Connection and Isolation Panel [NHANES]    Frequency of Communication with Friends and Family: Patient declined    Frequency of Social Gatherings with Friends and Family: Patient declined    Attends Religious Services: Patient declined    Active Member of Clubs or Organizations: Patient declined    Attends [employer]  or Organization Meetings: Patient declined    Marital Status: Widowed    Family History  Problem Relation Age of Onset   Thyroid  disease Mother    Hypertension Mother    Heart attack Mother    Stroke Mother    Heart failure Mother    Diabetes Mother    Emphysema Mother    Cancer Mother    Diabetes Father    Heart attack Father    Stroke Father    Kidney disease Father    Heart failure Father     Outpatient Encounter Medications as of 03/19/2024  Medication Sig   tirzepatide  (MOUNJARO ) 10 MG/0.5ML Pen Inject 10 mg into the skin once a week.   albuterol  (PROVENTIL ) (2.5 MG/3ML) 0.083% nebulizer solution  Take 3 mLs (2.5 mg total) by nebulization every 4 (four) hours as needed for wheezing or shortness of breath.   albuterol  (VENTOLIN  HFA) 108 (90 Base) MCG/ACT inhaler Inhale 2 puffs into the lungs every 4 (four) hours as needed for wheezing or shortness of breath.   amLODipine  (NORVASC ) 10 MG tablet TAKE 1 TABLET BY MOUTH EVERY DAY   atorvastatin  (LIPITOR) 80 MG tablet TAKE 1 TABLET BY MOUTH EVERY DAY   BD PEN NEEDLE NANO 2ND GEN 32G X 4 MM MISC USE 1 PEN NEEDLE FOR 6 INJECTIONS DAILY FOR DIABETES E11.9   BOOSTRIX  5-2.5-18.5 LF-MCG/0.5 injection    budesonide -formoterol  (SYMBICORT ) 160-4.5 MCG/ACT inhaler In event of flares:  take 2 puffs first thing in am and then another 2 puffs about 12 hours later.   budesonide -formoterol  (SYMBICORT ) 80-4.5 MCG/ACT inhaler TAKE 2 PUFFS FIRST THING IN AM AND THEN ANOTHER 2 PUFFS ABOUT 12 HOURS LATER   calcitRIOL (ROCALTROL) 0.25 MCG capsule Take 0.25 mcg by mouth 3 (three) times a week.   Continuous Blood Gluc Receiver (FREESTYLE LIBRE 2 READER) DEVI As directed   cyclobenzaprine  (FLEXERIL ) 5 MG tablet TAKE 1 TABLET BY MOUTH 2 TIMES DAILY AS NEEDED FOR MUSCLE SPASMS.   diclofenac (CATAFLAM) 50 MG tablet Take 50 mg by mouth daily as needed.   diclofenac Sodium (VOLTAREN) 1 % GEL Apply 1 Application topically 4 (four) times daily as needed (pain).   famotidine  (PEPCID ) 20 MG tablet TAKE 1 TABLET AFTER SUPPER   FARXIGA  10 MG TABS tablet Take 10 mg by mouth every morning.   Ferrous Sulfate (IRON PO) Take 1 tablet by mouth daily.   fluticasone (FLONASE) 50 MCG/ACT nasal spray Place 1 spray into both nostrils daily as needed for allergies.   furosemide  (LASIX ) 20 MG tablet TAKE 1 TABLET BY MOUTH EVERY DAY   glucose blood (ACCU-CHEK AVIVA PLUS) test strip    HYDROcodone -acetaminophen  (NORCO/VICODIN) 5-325 MG tablet Take 1 tablet by mouth every 12 (twelve) hours as needed for severe pain.   insulin  glargine (LANTUS  SOLOSTAR) 100 UNIT/ML Solostar Pen Inject 70 Units  into the skin at bedtime.   insulin  lispro (HUMALOG  KWIKPEN) 100 UNIT/ML KwikPen Inject 10-16 Units into the skin 3 (three) times daily before meals.   irbesartan  (AVAPRO ) 75 MG tablet TAKE 1 TABLET BY MOUTH EVERY DAY   KERENDIA 10 MG TABS Take 1 tablet by mouth daily.   metFORMIN  (GLUCOPHAGE ) 500 MG tablet TAKE 1 TABLET BY MOUTH TWICE A DAY WITH FOOD   naloxone (NARCAN) nasal spray 4 mg/0.1 mL Place 1 spray into the nose once.   nystatin cream (MYCOSTATIN) Apply 1 application  topically 2 (two) times daily as needed (rash).   omeprazole  (PRILOSEC) 40 MG capsule Take 1  capsule (40 mg total) by mouth daily.   telmisartan (MICARDIS) 20 MG tablet Take 20 mg by mouth 2 (two) times daily.   VITAMIN D  PO Take 1 capsule by mouth daily.   [DISCONTINUED] FARXIGA  5 MG TABS tablet Take 5 mg by mouth every morning.   [DISCONTINUED] insulin  glargine (LANTUS  SOLOSTAR) 100 UNIT/ML Solostar Pen INJECT 80 UNITS INTO THE SKIN AT BEDTIME.   [DISCONTINUED] JARDIANCE  10 MG TABS tablet TAKE 1 TABLET BY MOUTH DAILY BEFORE BREAKFAST. (Patient not taking: Reported on 02/18/2024)   [DISCONTINUED] OVER THE COUNTER MEDICATION Calcium  one daily (Patient not taking: Reported on 02/18/2024)   [DISCONTINUED] OVER THE COUNTER MEDICATION Iron 65 mg one bid (Patient not taking: Reported on 02/18/2024)   [DISCONTINUED] tirzepatide  (MOUNJARO ) 7.5 MG/0.5ML Pen INJECT 7.5 MG SUBCUTANEOUSLY WEEKLY   No facility-administered encounter medications on file as of 03/19/2024.    ALLERGIES: Allergies  Allergen Reactions   Morphine  And Codeine Itching   Other Other (See Comments)    Bells Palsy    Oxycodone-Acetaminophen  Other (See Comments)    Constipates patient    Latex Itching and Other (See Comments)    Skin "cracks" open     VACCINATION STATUS: Immunization History  Administered Date(s) Administered   Moderna SARS-COV2 Booster Vaccination 09/03/2021   Moderna Sars-Covid-2 Vaccination 12/11/2019, 11/13/2020   PNEUMOCOCCAL  CONJUGATE-20 10/06/2021   Tdap 03/13/2018   Zoster Recombinant(Shingrix) 10/06/2021, 02/06/2022    Diabetes She presents for her follow-up diabetic visit. She has type 2 diabetes mellitus. Onset time: She was diagnosed at approximately age of 44 years. Her disease course has been improving. There are no hypoglycemic associated symptoms. Pertinent negatives for hypoglycemia include no confusion, headaches, pallor or seizures. Pertinent negatives for diabetes include no chest pain, no fatigue, no polydipsia, no polyphagia and no polyuria. There are no hypoglycemic complications. Symptoms are improving. Risk factors for coronary artery disease include dyslipidemia, diabetes mellitus, hypertension, obesity, sedentary lifestyle and tobacco exposure. Current diabetic treatment includes insulin  injections. Her weight is decreasing steadily. She is following a generally unhealthy diet. When asked about meal planning, she reported none. She has not had a previous visit with a dietitian. She never participates in exercise. Her home blood glucose trend is decreasing steadily. Her breakfast blood glucose range is generally 140-180 mg/dl. Her lunch blood glucose range is generally 140-180 mg/dl. Her dinner blood glucose range is generally 140-180 mg/dl. Her bedtime blood glucose range is generally 140-180 mg/dl. Her overall blood glucose range is 140-180 mg/dl. (Christine Weaver presents with her CGM device showing average blood glucose of 139 over the last 14 days.  Her AGP report shows 84% time in range, 19% level 1 hyperglycemia, 2% able to hyperglycemia.  She does not have significant hypoglycemia.  Her point-of-care A1c is 7.4% overall improving from 8.4% during her last visit.   ) An ACE inhibitor/angiotensin II receptor blocker is being taken. Eye exam is current.  Hyperlipidemia This is a chronic problem. The problem is uncontrolled. Recent lipid tests were reviewed and are high. Exacerbating diseases include diabetes  and obesity. Pertinent negatives include no chest pain, myalgias or shortness of breath. Current antihyperlipidemic treatment includes statins. Risk factors for coronary artery disease include dyslipidemia, diabetes mellitus, family history, hypertension, obesity and a sedentary lifestyle.  Hypertension This is a chronic problem. The current episode started more than 1 year ago. The problem is uncontrolled. Pertinent negatives include no chest pain, headaches, palpitations or shortness of breath. Risk factors for coronary artery disease include diabetes mellitus,  dyslipidemia, obesity, sedentary lifestyle, smoking/tobacco exposure and family history. Past treatments include ACE inhibitors.   Review of systems: Limited as above.    Objective:       03/19/2024    9:06 AM 02/18/2024   11:08 AM 11/19/2023    9:49 AM  Vitals with BMI  Height 5\' 0"  5\' 0"  5\' 0"   Weight 199 lbs 13 oz 203 lbs 8 oz 202 lbs 3 oz  BMI 39.02 39.74 39.49  Systolic 142 111 829  Diastolic 82 67 80  Pulse 96 103 76    BP (!) 142/82   Pulse 96   Ht 5' (1.524 m)   Wt 199 lb 12.8 oz (90.6 kg)   BMI 39.02 kg/m   Wt Readings from Last 3 Encounters:  03/19/24 199 lb 12.8 oz (90.6 kg)  02/18/24 203 lb 8 oz (92.3 kg)  11/19/23 202 lb 3.2 oz (91.7 kg)     CMP ( most recent) CMP     Component Value Date/Time   NA 139 11/14/2023 0812   K 4.6 11/14/2023 0812   CL 99 11/14/2023 0812   CO2 23 11/14/2023 0812   GLUCOSE 161 (H) 11/14/2023 0812   GLUCOSE 184 (H) 12/14/2021 0901   BUN 18 11/14/2023 0812   CREATININE 1.32 (H) 11/14/2023 0812   CALCIUM  9.7 11/14/2023 0812   PROT 6.4 11/14/2023 0812   ALBUMIN 3.6 (L) 11/14/2023 0812   AST 14 11/14/2023 0812   ALT 15 11/14/2023 0812   ALKPHOS 139 (H) 11/14/2023 0812   BILITOT 0.2 11/14/2023 0812   GFRNONAA >60 12/14/2021 0901   GFRAA 80 11/11/2020 0817    Diabetic Labs (most recent): Lab Results  Component Value Date   HGBA1C 8.4 (A) 11/19/2023   HGBA1C 7.9 (A)  07/11/2023   HGBA1C 7.7 06/21/2023     Lipid Panel ( most recent) Lipid Panel     Component Value Date/Time   CHOL 212 (H) 11/14/2023 0812   TRIG 328 (H) 11/14/2023 0812   HDL 36 (L) 11/14/2023 0812   CHOLHDL 5.9 (H) 11/14/2023 0812   LDLCALC 119 (H) 11/14/2023 0812   LABVLDL 57 (H) 11/14/2023 0812      Lab Results  Component Value Date   TSH 1.940 11/14/2023   TSH 3.110 04/06/2022   TSH 1.210 10/13/2021   TSH 1.730 01/27/2021   TSH 2.640 11/11/2020   TSH 1.42 09/23/2019   FREET4 1.13 11/14/2023   FREET4 1.25 04/06/2022   FREET4 1.25 01/27/2021   FREET4 1.28 11/11/2020      Assessment & Plan:   1. Type 2 diabetes mellitus with hyperglycemia, with long-term current use of insulin  (HCC)  - Christine Weaver has currently uncontrolled symptomatic type 2 DM since  58 years of age.  Christine Weaver presents with her CGM device showing average blood glucose of 139 over the last 14 days.  Her AGP report shows 84% time in range, 19% level 1 hyperglycemia, 2% able to hyperglycemia.  She does not have significant hypoglycemia.  Her point-of-care A1c is 7.4% overall improving from 8.4% during her last visit.     Recent labs reviewed.  - I had a long discussion with her about the progressive nature of diabetes and the pathology behind its complications. -her diabetes is complicated by obesity/sedentary life, smoking and she remains at a high risk for more acute and chronic complications which include CAD, CVA, CKD, retinopathy, and neuropathy. These are all discussed in detail with her.  -  she is advised  to stick to a routine mealtimes to eat 3 meals  a day and avoid unnecessary snacks ( to snack only to correct hypoglycemia).  - she acknowledges that there is a room for improvement in her food and drink choices. - Suggestion is made for her to avoid simple carbohydrates  from her diet including Cakes, Sweet Desserts, Ice Cream, Soda (diet and regular), Sweet Tea, Candies, Chips, Cookies,  Store Bought Juices, Alcohol in Excess of  1-2 drinks a day, Artificial Sweeteners,  Coffee Creamer, and "Sugar-free" Products, Lemonade. This will help patient to have more stable blood glucose profile and potentially avoid unintended weight gain.     - I have approached her with the following individualized plan to manage  her diabetes and patient agrees:    She will continue to need multiple modality treatments in order for her to maintain control of diabetes to target.    -She presents with tightening of nocturnal glycemic profile, advised to lower Lantus  to 70 units nightly.  She is advised to continue Humalog   10 -16 units 3 times daily AC.   -She is advised to continue to use her CGM continuously.   - she is warned not to take insulin  without proper monitoring per orders.  - she is encouraged to call clinic for blood glucose levels less than 70 or above 200 mg /dl. - she is advised to continue metformin  500 mg p.o. twice daily, Farxiga  10 mg p.o. daily at breakfast.  Side effects and precautions discussed with her. - She is tolerating Mounjaro , advised to increase Mounjaro  to 10 mg subcutaneously weekly.   - Specific targets for  A1c;  LDL, HDL,  and Triglycerides were discussed with the patient.  2) Blood Pressure /Hypertension:   - Her blood pressures not controlled to target.   she is advised to continue her current medications including lisinopril  20 mg p.o. daily with breakfast .  She was found to have urine microalbumin of 150 mg/L, recently  Kerendia was initiated by her nephrologist at 10 mg p.o. daily, advised to continue.     3) Lipids/Hyperlipidemia:   Review of her recent lipid panel showed uncontrolled LDL at 119, increasing from 104.    She is advised to continue atorvastatin  80 mg p.o. nightly.  She will have fasting lipid panel before next visit.   Whole food plant-based diet was discussed and recommended to her.     4)  Weight/Diet: Her BMI is 39.02- clearly  complicating her diabetes care.   she is  a candidate for weight loss. I discussed with her the fact that loss of 5 - 10% of her  current body weight will have the most impact on her diabetes management.  Exercise, and detailed carbohydrates information provided  -  detailed on discharge instructions.  5) Chronic Care/Health Maintenance:  -she  is on ACEI/ARB and Statin medications and  is encouraged to initiate and continue to follow up with Ophthalmology, Dentist,  Podiatrist at least yearly or according to recommendations, and advised to  quit smoking. I have recommended yearly flu vaccine and pneumonia vaccine at least every 5 years; moderate intensity exercise for up to 150 minutes weekly; and  sleep for at least 7 hours a day.  Her recent screening ABI was negative for PAD in February 2022.  Her next study will be due in February 2027 or sooner if needed.   - she is  advised to maintain close follow up with Meldon Sport, MD for  primary care needs, as well as her other providers for optimal and coordinated care.   I spent  41  minutes in the care of the patient today including review of labs from CMP, Lipids, Thyroid  Function, Hematology (current and previous including abstractions from other facilities); face-to-face time discussing  her blood glucose readings/logs, discussing hypoglycemia and hyperglycemia episodes and symptoms, medications doses, her options of short and long term treatment based on the latest standards of care / guidelines;  discussion about incorporating lifestyle medicine;  and documenting the encounter. Risk reduction counseling performed per USPSTF guidelines to reduce  obesity and cardiovascular risk factors.     Please refer to Patient Instructions for Blood Glucose Monitoring and Insulin /Medications Dosing Guide"  in media tab for additional information. Please  also refer to " Patient Self Inventory" in the Media  tab for reviewed elements of pertinent patient  history.  Christine Weaver participated in the discussions, expressed understanding, and voiced agreement with the above plans.  All questions were answered to her satisfaction. she is encouraged to contact clinic should she have any questions or concerns prior to her return visit.     Follow up plan: - Return in about 4 months (around 07/20/2024) for F/U with Pre-visit Labs, Meter/CGM/Logs, A1c here.  Kalvin Orf, MD [facility] [address] [place], [place] [address] Phone: [phone]  Fax: [phone]    03/19/2024, 9:36 AM  This note was partially dictated with voice recognition software. Similar sounding words can be transcribed inadequately or may not  be corrected upon review.

## 2024-03-19 NOTE — Patient Instructions (Signed)

## 2024-03-21 ENCOUNTER — Other Ambulatory Visit: Payer: Self-pay | Admitting: Internal Medicine

## 2024-03-21 ENCOUNTER — Other Ambulatory Visit: Payer: Self-pay | Admitting: "Endocrinology

## 2024-03-21 DIAGNOSIS — M5431 Sciatica, right side: Secondary | ICD-10-CM

## 2024-03-21 DIAGNOSIS — E119 Type 2 diabetes mellitus without complications: Secondary | ICD-10-CM

## 2024-03-25 ENCOUNTER — Other Ambulatory Visit: Payer: Self-pay

## 2024-03-25 DIAGNOSIS — E119 Type 2 diabetes mellitus without complications: Secondary | ICD-10-CM

## 2024-03-25 MED ORDER — INSULIN LISPRO (1 UNIT DIAL) 100 UNIT/ML (KWIKPEN): PEN_INJECTOR | SUBCUTANEOUS | 0 refills | Status: DC

## 2024-03-25 NOTE — Addendum Note (Signed)
 Addended by: Vernon Goodpasture on: 03/25/2024 10:55 AM   Modules accepted: Orders

## 2024-03-28 DIAGNOSIS — Z79891 Long term (current) use of opiate analgesic: Secondary | ICD-10-CM | POA: Diagnosis not present

## 2024-03-28 DIAGNOSIS — M546 Pain in thoracic spine: Secondary | ICD-10-CM | POA: Diagnosis not present

## 2024-03-28 DIAGNOSIS — G894 Chronic pain syndrome: Secondary | ICD-10-CM | POA: Diagnosis not present

## 2024-03-28 DIAGNOSIS — E1142 Type 2 diabetes mellitus with diabetic polyneuropathy: Secondary | ICD-10-CM | POA: Diagnosis not present

## 2024-03-28 DIAGNOSIS — M545 Low back pain, unspecified: Secondary | ICD-10-CM | POA: Diagnosis not present

## 2024-03-28 DIAGNOSIS — M542 Cervicalgia: Secondary | ICD-10-CM | POA: Diagnosis not present

## 2024-04-04 ENCOUNTER — Other Ambulatory Visit: Payer: Self-pay | Admitting: Internal Medicine

## 2024-04-04 ENCOUNTER — Other Ambulatory Visit: Payer: Self-pay | Admitting: "Endocrinology

## 2024-04-04 DIAGNOSIS — E119 Type 2 diabetes mellitus without complications: Secondary | ICD-10-CM

## 2024-04-04 DIAGNOSIS — M5431 Sciatica, right side: Secondary | ICD-10-CM

## 2024-04-05 ENCOUNTER — Emergency Department (HOSPITAL_COMMUNITY)
Admission: EM | Admit: 2024-04-05 | Discharge: 2024-04-05 | Disposition: A | Discharge status: Home / Self Care | Attending: Emergency Medicine | Admitting: Emergency Medicine

## 2024-04-05 ENCOUNTER — Other Ambulatory Visit: Payer: Self-pay

## 2024-04-05 ENCOUNTER — Emergency Department (HOSPITAL_COMMUNITY)

## 2024-04-05 ENCOUNTER — Encounter (HOSPITAL_COMMUNITY): Payer: Self-pay

## 2024-04-05 DIAGNOSIS — Z794 Long term (current) use of insulin: Secondary | ICD-10-CM | POA: Diagnosis not present

## 2024-04-05 DIAGNOSIS — Z91040 Latex allergy status: Secondary | ICD-10-CM | POA: Diagnosis not present

## 2024-04-05 DIAGNOSIS — M19072 Primary osteoarthritis, left ankle and foot: Secondary | ICD-10-CM | POA: Diagnosis not present

## 2024-04-05 DIAGNOSIS — M25572 Pain in left ankle and joints of left foot: Secondary | ICD-10-CM | POA: Diagnosis not present

## 2024-04-05 DIAGNOSIS — M7732 Calcaneal spur, left foot: Secondary | ICD-10-CM | POA: Diagnosis not present

## 2024-04-05 MED ORDER — KETOROLAC TROMETHAMINE 60 MG/2ML IM SOLN
60.0000 mg | Freq: Once | INTRAMUSCULAR | Status: AC
  Administered 2024-04-05: 60 mg via INTRAMUSCULAR
  Filled 2024-04-05: qty 2

## 2024-04-05 MED ORDER — COLCHICINE 0.6 MG PO TABS: 0.6000 mg | ORAL_TABLET | Freq: Two times a day (BID) | ORAL | 0 refills | Status: AC

## 2024-04-05 NOTE — ED Triage Notes (Signed)
 Pt stated that her left ankle started hurting and swelling before dinner. Does not know what she did to it and stated that she has not had any trauma to it.

## 2024-04-05 NOTE — ED Notes (Signed)
 Portable xray at bedside.

## 2024-04-05 NOTE — ED Provider Notes (Signed)
 [place] EMERGENCY DEPARTMENT AT [facility] Provider Note   CSN: [id number] Arrival date & time: 04/05/24  2214     History  Chief Complaint  Patient presents with   Ankle Pain    Christine Weaver is a 58 y.o. female.   Ankle Pain    This patient is a 58 year old female with multiple prior surgeries of her left ankle presenting with acute onset of left ankle pain that occurred this evening.  She does not remember having any specific injuries, she is not able to walk on it because of severe pain, she denies any specific swelling or redness or fever.  No other joint arthropathy, no history of gout, no fevers or chills  Home Medications Prior to Admission medications   Medication Sig Start Date End Date Taking? Authorizing Provider  colchicine 0.6 MG tablet Take 1 tablet (0.6 mg total) by mouth 2 (two) times daily. 04/05/24  Yes Early Glisson, MD  albuterol  (PROVENTIL ) (2.5 MG/3ML) 0.083% nebulizer solution Take 3 mLs (2.5 mg total) by nebulization every 4 (four) hours as needed for wheezing or shortness of breath. 08/20/23   Diamond Formica, MD  albuterol  (VENTOLIN  HFA) 108 (90 Base) MCG/ACT inhaler Inhale 2 puffs into the lungs every 4 (four) hours as needed for wheezing or shortness of breath. 11/10/20   Meldon Sport, MD  amLODipine  (NORVASC ) 10 MG tablet TAKE 1 TABLET BY MOUTH EVERY DAY 10/18/23   Meldon Sport, MD  atorvastatin  (LIPITOR) 80 MG tablet TAKE 1 TABLET BY MOUTH EVERY DAY 02/20/24   Patel, Rutwik K, MD  BD PEN NEEDLE NANO 2ND GEN 32G X 4 MM MISC USE 1 PEN NEEDLE FOR 6 INJECTIONS DAILY FOR DIABETES E11.9 10/01/23   Meldon Sport, MD  BOOSTRIX  5-2.5-18.5 LF-MCG/0.5 injection  09/14/23   [provider]  budesonide -formoterol  (SYMBICORT ) 160-4.5 MCG/ACT inhaler In event of flares:  take 2 puffs first thing in am and then another 2 puffs about 12 hours later. 08/20/23   Diamond Formica, MD  budesonide -formoterol  (SYMBICORT ) 80-4.5 MCG/ACT inhaler TAKE 2  PUFFS FIRST THING IN AM AND THEN ANOTHER 2 PUFFS ABOUT 12 HOURS LATER 12/23/23   Diamond Formica, MD  calcitRIOL (ROCALTROL) 0.25 MCG capsule Take 0.25 mcg by mouth 3 (three) times a week. 10/10/23   [provider]  Continuous Blood Gluc Receiver (FREESTYLE LIBRE 2 READER) DEVI As directed 12/23/20   Nida, Gebreselassie W, MD  cyclobenzaprine  (FLEXERIL ) 5 MG tablet TAKE 1 TABLET BY MOUTH 2 TIMES DAILY AS NEEDED FOR MUSCLE SPASMS. 03/25/24   Patel, Rutwik K, MD  diclofenac (CATAFLAM) 50 MG tablet Take 50 mg by mouth daily as needed. 08/10/23   [provider]  diclofenac Sodium (VOLTAREN) 1 % GEL Apply 1 Application topically 4 (four) times daily as needed (pain).    [provider]  famotidine  (PEPCID ) 20 MG tablet TAKE 1 TABLET AFTER SUPPER 11/19/23   Wert, Michael B, MD  FARXIGA  10 MG TABS tablet Take 10 mg by mouth every morning. 01/07/24   [provider]  Ferrous Sulfate (IRON PO) Take 1 tablet by mouth daily.    [provider]  fluticasone (FLONASE) 50 MCG/ACT nasal spray Place 1 spray into both nostrils daily as needed for allergies.    [provider]  furosemide  (LASIX ) 20 MG tablet TAKE 1 TABLET BY MOUTH EVERY DAY 02/18/24   Gerard Knight, MD  glucose blood (ACCU-CHEK AVIVA PLUS) test strip  10/27/12  [provider]  HYDROcodone -acetaminophen  (NORCO/VICODIN) 5-325 MG tablet Take 1 tablet by mouth every 12 (twelve) hours as needed for severe pain.    [provider]  insulin  glargine (LANTUS  SOLOSTAR) 100 UNIT/ML Solostar Pen Inject 70 Units into the skin at bedtime. 03/19/24   Nida, Gebreselassie W, MD  insulin  lispro (HUMALOG  KWIKPEN) 100 UNIT/ML KwikPen INJECT 10-16 UNITS INTO THE SKIN 3 TIMES DAILY WITH MEALS 03/25/24   Nida, Gebreselassie W, MD  irbesartan  (AVAPRO ) 75 MG tablet TAKE 1 TABLET BY MOUTH EVERY DAY 11/15/22   Wert, Michael B, MD  KERENDIA 10 MG TABS Take 1 tablet by mouth daily.    [provider]  metFORMIN  (GLUCOPHAGE ) 500 MG tablet TAKE 1 TABLET BY MOUTH TWICE A DAY WITH FOOD 03/04/24   Nida, Gebreselassie W, MD  naloxone [facility]) nasal spray 4 mg/0.1 mL Place 1 spray into the nose once. 05/30/23   [provider]  nystatin cream (MYCOSTATIN) Apply 1 application  topically 2 (two) times daily as needed (rash).    [provider]  omeprazole  (PRILOSEC) 40 MG capsule Take 1 capsule (40 mg total) by mouth daily. 03/03/24   Diamond Formica, MD  telmisartan (MICARDIS) 20 MG tablet Take 20 mg by mouth 2 (two) times daily. 01/16/24   [provider]  tirzepatide  (MOUNJARO ) 10 MG/0.5ML Pen Inject 10 mg into the skin once a week. 03/19/24   Baby Bolt, MD  VITAMIN D  PO Take 1 capsule by mouth daily.    [provider]      Allergies    Morphine  and codeine, Other, Oxycodone-acetaminophen , and Latex    Review of Systems   Review of Systems  All other systems reviewed and are negative.   Physical Exam Updated Vital Signs BP (!) 140/73   Pulse 97   Ht 1.524 m (5')   Wt 91 kg   SpO2 96%   BMI 39.18 kg/m  Physical Exam Vitals and nursing note reviewed.  Constitutional:      General: She is not in acute distress.    Appearance: She is well-developed.  HENT:     Head: Normocephalic and atraumatic.     Mouth/Throat:     Pharynx: No oropharyngeal exudate.  Eyes:     General: No scleral icterus.       Right eye: No discharge.        Left eye: No discharge.     Conjunctiva/sclera: Conjunctivae normal.     Pupils: Pupils are equal, round, and reactive to light.  Neck:     Thyroid : No thyromegaly.     Vascular: No JVD.  Cardiovascular:     Rate and Rhythm: Normal rate and regular rhythm.     Heart sounds: Normal heart sounds. No murmur heard.    No friction rub. No gallop.  Pulmonary:     Effort: Pulmonary effort is normal. No respiratory distress.     Breath sounds: Normal breath sounds. No wheezing or rales.  Abdominal:      General: Bowel sounds are normal. There is no distension.     Palpations: Abdomen is soft. There is no mass.     Tenderness: There is no abdominal tenderness.  Musculoskeletal:        General: Tenderness present. No swelling, deformity or signs of injury.     Cervical back: Normal range of motion and neck supple.     Right lower leg: No edema.     Left lower leg: No  edema.     Comments: Tenderness over the left lateral malleolus and just above it, decreased range of motion of the ankle secondary to pain at that location, there is no redness swelling or induration, no obvious injury to the ankle.  No bony tenderness over the foot, normal range of motion of the knee  Lymphadenopathy:     Cervical: No cervical adenopathy.  Skin:    General: Skin is warm and dry.     Findings: No erythema or rash.  Neurological:     General: No focal deficit present.     Mental Status: She is alert.     Coordination: Coordination normal.  Psychiatric:        Behavior: Behavior normal.     ED Results / Procedures / Treatments   Labs (all labs ordered are listed, but only abnormal results are displayed) Labs Reviewed - No data to display  EKG None  Radiology DG Ankle Complete Left Result Date: 04/05/2024 CLINICAL DATA:  Left ankle pain, no known injury, initial encounter EXAM: LEFT ANKLE COMPLETE - 3+ VIEW COMPARISON:  None Available. FINDINGS: No acute fracture or dislocation is noted. Mild tibiotalar degenerative changes are seen. Calcaneal spurring is noted. No soft tissue abnormality is noted. IMPRESSION: Chronic changes without acute abnormality. Electronically Signed   By: Violeta Grey M.D.   On: 04/05/2024 22:59    Procedures Procedures    Medications Ordered in ED Medications  ketorolac (TORADOL) injection 60 mg (has no administration in time range)    ED Course/ Medical Decision Making/ A&P                                 Medical Decision Making Amount and/or Complexity of Data  Reviewed Radiology: ordered.  Risk Prescription drug management.   Vital signs unremarkable, focal arthritis of the monoarticular joint, consider several different etiologies, unlikely to be infection as there is absence of any signs of infection, could be an acute arthritis such as an acute gouty arthritis, doubt hemarthrosis, patient is not anticoagulated.  Patient: I personally viewed and interpreted the x-ray of the ankle which shows no acute findings.  I agree with the radiologist interpretation        Final Clinical Impression(s) / ED Diagnoses Final diagnoses:  Acute left ankle pain    Rx / DC Orders ED Discharge Orders          Ordered    colchicine 0.6 MG tablet  2 times daily        04/05/24 2304              Early Glisson, MD 04/05/24 2306

## 2024-04-05 NOTE — Discharge Instructions (Signed)
 Your x-ray was normal, no signs of broken bones  Try colchicine twice a day for 5 days, you can stop the colchicine if the pain goes away  Talk to your doctor for ongoing pain lasting more than 24 to 48 hours.  Return for severe worsening pain swelling or redness

## 2024-04-15 ENCOUNTER — Other Ambulatory Visit: Payer: Self-pay | Admitting: Acute Care

## 2024-04-15 DIAGNOSIS — Z87891 Personal history of nicotine dependence: Secondary | ICD-10-CM

## 2024-04-15 DIAGNOSIS — Z122 Encounter for screening for malignant neoplasm of respiratory organs: Secondary | ICD-10-CM

## 2024-04-16 DIAGNOSIS — J449 Chronic obstructive pulmonary disease, unspecified: Secondary | ICD-10-CM | POA: Diagnosis not present

## 2024-04-16 DIAGNOSIS — I509 Heart failure, unspecified: Secondary | ICD-10-CM | POA: Diagnosis not present

## 2024-04-21 ENCOUNTER — Other Ambulatory Visit: Payer: Self-pay | Admitting: "Endocrinology

## 2024-04-21 ENCOUNTER — Other Ambulatory Visit: Payer: Self-pay | Admitting: Internal Medicine

## 2024-04-21 DIAGNOSIS — E119 Type 2 diabetes mellitus without complications: Secondary | ICD-10-CM

## 2024-04-21 DIAGNOSIS — M5431 Sciatica, right side: Secondary | ICD-10-CM

## 2024-04-25 DIAGNOSIS — Z79891 Long term (current) use of opiate analgesic: Secondary | ICD-10-CM | POA: Diagnosis not present

## 2024-04-25 DIAGNOSIS — M5431 Sciatica, right side: Secondary | ICD-10-CM | POA: Diagnosis not present

## 2024-04-25 DIAGNOSIS — E1142 Type 2 diabetes mellitus with diabetic polyneuropathy: Secondary | ICD-10-CM | POA: Diagnosis not present

## 2024-04-25 DIAGNOSIS — M546 Pain in thoracic spine: Secondary | ICD-10-CM | POA: Diagnosis not present

## 2024-04-25 DIAGNOSIS — G894 Chronic pain syndrome: Secondary | ICD-10-CM | POA: Diagnosis not present

## 2024-04-25 DIAGNOSIS — M542 Cervicalgia: Secondary | ICD-10-CM | POA: Diagnosis not present

## 2024-05-06 ENCOUNTER — Ambulatory Visit (HOSPITAL_COMMUNITY)
Admission: RE | Admit: 2024-05-06 | Discharge: 2024-05-06 | Disposition: A | Discharge status: Home / Self Care | Source: Ambulatory Visit | Attending: Acute Care | Admitting: Acute Care

## 2024-05-06 DIAGNOSIS — Z122 Encounter for screening for malignant neoplasm of respiratory organs: Secondary | ICD-10-CM | POA: Insufficient documentation

## 2024-05-06 DIAGNOSIS — Z87891 Personal history of nicotine dependence: Secondary | ICD-10-CM | POA: Insufficient documentation

## 2024-05-06 DIAGNOSIS — R809 Proteinuria, unspecified: Secondary | ICD-10-CM | POA: Diagnosis not present

## 2024-05-06 DIAGNOSIS — E211 Secondary hyperparathyroidism, not elsewhere classified: Secondary | ICD-10-CM | POA: Diagnosis not present

## 2024-05-06 DIAGNOSIS — N189 Chronic kidney disease, unspecified: Secondary | ICD-10-CM | POA: Diagnosis not present

## 2024-05-06 DIAGNOSIS — E782 Mixed hyperlipidemia: Secondary | ICD-10-CM | POA: Diagnosis not present

## 2024-05-06 DIAGNOSIS — E1122 Type 2 diabetes mellitus with diabetic chronic kidney disease: Secondary | ICD-10-CM | POA: Diagnosis not present

## 2024-05-06 DIAGNOSIS — Z794 Long term (current) use of insulin: Secondary | ICD-10-CM | POA: Diagnosis not present

## 2024-05-06 DIAGNOSIS — N1831 Chronic kidney disease, stage 3a: Secondary | ICD-10-CM | POA: Diagnosis not present

## 2024-05-06 DIAGNOSIS — D631 Anemia in chronic kidney disease: Secondary | ICD-10-CM | POA: Diagnosis not present

## 2024-05-08 LAB — CMP14+EGFR
ALT: 16 IU/L (ref 0–32)
AST: 16 IU/L (ref 0–40)
Albumin: 3.9 g/dL (ref 3.8–4.9)
Alkaline Phosphatase: 144 IU/L — ABNORMAL HIGH (ref 44–121)
BUN/Creatinine Ratio: 13 (ref 9–23)
BUN: 20 mg/dL (ref 6–24)
Bilirubin Total: 0.3 mg/dL (ref 0.0–1.2)
CO2: 23 mmol/L (ref 20–29)
Calcium: 10.3 mg/dL — ABNORMAL HIGH (ref 8.7–10.2)
Chloride: 102 mmol/L (ref 96–106)
Creatinine, Ser: 1.51 mg/dL — ABNORMAL HIGH (ref 0.57–1.00)
Globulin, Total: 2.7 g/dL (ref 1.5–4.5)
Glucose: 80 mg/dL (ref 70–99)
Potassium: 5.1 mmol/L (ref 3.5–5.2)
Sodium: 141 mmol/L (ref 134–144)
Total Protein: 6.6 g/dL (ref 6.0–8.5)
eGFR: 40 mL/min/1.73 — ABNORMAL LOW (ref >59)

## 2024-05-08 LAB — LIPID PANEL
Chol/HDL Ratio: 5.1 ratio — ABNORMAL HIGH (ref 0.0–4.4)
Cholesterol, Total: 168 mg/dL (ref 100–199)
HDL: 33 mg/dL — ABNORMAL LOW (ref >39)
LDL Chol Calc (NIH): 81 mg/dL (ref 0–99)
Triglycerides: 331 mg/dL — ABNORMAL HIGH (ref 0–149)
VLDL Cholesterol Cal: 54 mg/dL — ABNORMAL HIGH (ref 5–40)

## 2024-05-08 LAB — MICROALBUMIN / CREATININE URINE RATIO
Creatinine, Urine: 68.6 mg/dL
Microalb/Creat Ratio: 2225 mg/g{creat} — AB (ref 0–29)
Microalbumin, Urine: 1526.4 ug/mL

## 2024-05-10 ENCOUNTER — Other Ambulatory Visit: Payer: Self-pay | Admitting: Internal Medicine

## 2024-05-10 DIAGNOSIS — M5431 Sciatica, right side: Secondary | ICD-10-CM

## 2024-05-12 ENCOUNTER — Other Ambulatory Visit: Payer: Self-pay

## 2024-05-12 ENCOUNTER — Telehealth: Payer: Self-pay | Admitting: Acute Care

## 2024-05-12 DIAGNOSIS — R911 Solitary pulmonary nodule: Secondary | ICD-10-CM

## 2024-05-12 DIAGNOSIS — Z794 Long term (current) use of insulin: Secondary | ICD-10-CM

## 2024-05-12 MED ORDER — LANTUS SOLOSTAR 100 UNIT/ML ~~LOC~~ SOPN: 70.0000 [IU] | PEN_INJECTOR | Freq: Every day | SUBCUTANEOUS | 0 refills | Status: DC

## 2024-05-12 NOTE — Telephone Encounter (Signed)
 Called and spoke to pt. Discussed the scan with the pt. She states she has not been sick recently. She only complains of seasonal allergies - runny nose, watery eyes. Patient denies acute respiratory symptoms. Will await Sarah's recs for repeat scan.    IMPRESSION: Lung-RADS 0, incomplete. Possible acute superimposed inflammatory process. Repeat examination in 2-3 months following conservative therapy is recommend for further evaluation.   Moderate coronary artery calcification.  Mild emphysema.   Aortic Atherosclerosis (ICD10-I70.0) and Emphysema (ICD10-J43.9).     Electronically Signed   By: Dorethia Molt M.D.   On: 05/11/2024 03:45

## 2024-05-13 NOTE — Telephone Encounter (Signed)
 Spoke with patient and informed of 8 weeks follow up recommended on repeat LDCT. Patient agrees and had no questions.  She would like a call closer to that date for scheduling and didn't want to schedule it now.  Order placed for follow up LDCT and results/plan faxed to PCP.

## 2024-05-13 NOTE — Telephone Encounter (Signed)
 Lauraine Lites NP has reviewed these results and recommends the patient have an 8 week follow up scan to re-assess, this is due 07/01/2024. Will need to inform patient notify PCP of results and plan.

## 2024-05-16 DIAGNOSIS — J449 Chronic obstructive pulmonary disease, unspecified: Secondary | ICD-10-CM | POA: Diagnosis not present

## 2024-05-16 DIAGNOSIS — I509 Heart failure, unspecified: Secondary | ICD-10-CM | POA: Diagnosis not present

## 2024-05-19 ENCOUNTER — Other Ambulatory Visit: Payer: Self-pay | Admitting: "Endocrinology

## 2024-05-19 ENCOUNTER — Other Ambulatory Visit: Payer: Self-pay | Admitting: Internal Medicine

## 2024-05-19 DIAGNOSIS — Z794 Long term (current) use of insulin: Secondary | ICD-10-CM

## 2024-05-19 DIAGNOSIS — M5431 Sciatica, right side: Secondary | ICD-10-CM

## 2024-05-20 DIAGNOSIS — E1165 Type 2 diabetes mellitus with hyperglycemia: Secondary | ICD-10-CM | POA: Diagnosis not present

## 2024-05-23 DIAGNOSIS — G894 Chronic pain syndrome: Secondary | ICD-10-CM | POA: Diagnosis not present

## 2024-05-23 DIAGNOSIS — M546 Pain in thoracic spine: Secondary | ICD-10-CM | POA: Diagnosis not present

## 2024-05-23 DIAGNOSIS — M545 Low back pain, unspecified: Secondary | ICD-10-CM | POA: Diagnosis not present

## 2024-05-23 DIAGNOSIS — M5136 Other intervertebral disc degeneration, lumbar region with discogenic back pain only: Secondary | ICD-10-CM | POA: Diagnosis not present

## 2024-05-23 DIAGNOSIS — Z79891 Long term (current) use of opiate analgesic: Secondary | ICD-10-CM | POA: Diagnosis not present

## 2024-05-23 DIAGNOSIS — E1142 Type 2 diabetes mellitus with diabetic polyneuropathy: Secondary | ICD-10-CM | POA: Diagnosis not present

## 2024-06-03 ENCOUNTER — Other Ambulatory Visit: Payer: Self-pay | Admitting: "Endocrinology

## 2024-06-03 DIAGNOSIS — E119 Type 2 diabetes mellitus without complications: Secondary | ICD-10-CM

## 2024-06-16 DIAGNOSIS — N2581 Secondary hyperparathyroidism of renal origin: Secondary | ICD-10-CM | POA: Diagnosis not present

## 2024-06-16 DIAGNOSIS — R808 Other proteinuria: Secondary | ICD-10-CM | POA: Diagnosis not present

## 2024-06-16 DIAGNOSIS — J449 Chronic obstructive pulmonary disease, unspecified: Secondary | ICD-10-CM | POA: Diagnosis not present

## 2024-06-16 DIAGNOSIS — I509 Heart failure, unspecified: Secondary | ICD-10-CM | POA: Diagnosis not present

## 2024-06-16 DIAGNOSIS — I129 Hypertensive chronic kidney disease with stage 1 through stage 4 chronic kidney disease, or unspecified chronic kidney disease: Secondary | ICD-10-CM | POA: Diagnosis not present

## 2024-06-16 DIAGNOSIS — N1832 Chronic kidney disease, stage 3b: Secondary | ICD-10-CM | POA: Diagnosis not present

## 2024-06-20 DIAGNOSIS — M5136 Other intervertebral disc degeneration, lumbar region with discogenic back pain only: Secondary | ICD-10-CM | POA: Diagnosis not present

## 2024-06-20 DIAGNOSIS — G8929 Other chronic pain: Secondary | ICD-10-CM | POA: Diagnosis not present

## 2024-06-20 DIAGNOSIS — E1142 Type 2 diabetes mellitus with diabetic polyneuropathy: Secondary | ICD-10-CM | POA: Diagnosis not present

## 2024-07-04 ENCOUNTER — Other Ambulatory Visit: Payer: Self-pay | Admitting: Internal Medicine

## 2024-07-04 DIAGNOSIS — M5431 Sciatica, right side: Secondary | ICD-10-CM

## 2024-07-05 ENCOUNTER — Other Ambulatory Visit: Payer: Self-pay | Admitting: "Endocrinology

## 2024-07-05 DIAGNOSIS — E119 Type 2 diabetes mellitus without complications: Secondary | ICD-10-CM

## 2024-07-09 ENCOUNTER — Telehealth: Payer: Self-pay | Admitting: Internal Medicine

## 2024-07-09 MED ORDER — INSULIN GLARGINE 100 UNIT/ML SOLOSTAR PEN: 70.0000 [IU] | PEN_INJECTOR | Freq: Every day | SUBCUTANEOUS | 0 refills | Status: AC

## 2024-07-09 NOTE — Telephone Encounter (Signed)
 Copied from CRM [id number]. Topic: Appointments - Appointment Scheduling >> Jul 09, 2024  9:36 AM Corean SAUNDERS wrote: Please call patient back to schedule her follow up with Dr. Darlean as [id number] attempted to schedule but Epic would only allow scheduling for new patient appointment. Patient was just seen by Lauraine Lites and Dr. Darlean within a few months, and there are no documented patient dismissals.  Called patient and scheduled 6 mos follow up with Dr. Darlean on 08/18/24 at 8:45am---will mail information to patient

## 2024-07-17 DIAGNOSIS — I509 Heart failure, unspecified: Secondary | ICD-10-CM | POA: Diagnosis not present

## 2024-07-18 DIAGNOSIS — G8929 Other chronic pain: Secondary | ICD-10-CM | POA: Diagnosis not present

## 2024-07-18 DIAGNOSIS — M5136 Other intervertebral disc degeneration, lumbar region with discogenic back pain only: Secondary | ICD-10-CM | POA: Diagnosis not present

## 2024-07-18 DIAGNOSIS — M545 Low back pain, unspecified: Secondary | ICD-10-CM | POA: Diagnosis not present

## 2024-07-18 DIAGNOSIS — E1142 Type 2 diabetes mellitus with diabetic polyneuropathy: Secondary | ICD-10-CM | POA: Diagnosis not present

## 2024-07-28 ENCOUNTER — Ambulatory Visit (INDEPENDENT_AMBULATORY_CARE_PROVIDER_SITE_OTHER): Admitting: "Endocrinology

## 2024-07-28 ENCOUNTER — Encounter: Payer: Self-pay | Admitting: "Endocrinology

## 2024-07-28 VITALS — BP 128/76 | HR 84 | Ht 60.0 in | Wt 203.0 lb

## 2024-07-28 DIAGNOSIS — Z794 Long term (current) use of insulin: Secondary | ICD-10-CM | POA: Diagnosis not present

## 2024-07-28 DIAGNOSIS — I10 Essential (primary) hypertension: Secondary | ICD-10-CM | POA: Diagnosis not present

## 2024-07-28 DIAGNOSIS — E119 Type 2 diabetes mellitus without complications: Secondary | ICD-10-CM

## 2024-07-28 DIAGNOSIS — E782 Mixed hyperlipidemia: Secondary | ICD-10-CM

## 2024-07-28 LAB — POCT GLYCOSYLATED HEMOGLOBIN (HGB A1C): HbA1c, POC (controlled diabetic range): 7.6 % — AB (ref 0.0–7.0)

## 2024-07-28 MED ORDER — FREESTYLE LIBRE 2 PLUS SENSOR MISC: 3 refills | Status: DC

## 2024-07-28 MED ORDER — TIRZEPATIDE 10 MG/0.5ML ~~LOC~~ SOAJ: 10.0000 mg | SUBCUTANEOUS | 1 refills | Status: DC

## 2024-07-28 NOTE — Patient Instructions (Signed)

## 2024-07-28 NOTE — Progress Notes (Signed)
 07/28/2024, 11:55 AM  Endocrinology follow-up note   Subjective:    Patient ID: Christine Weaver, female    DOB: Oct 05, 1966.  Christine Weaver is being seen in follow-up after she was seen in consultation for management of currently uncontrolled symptomatic diabetes requested by  Tobie Suzzane POUR, MD.   Past Medical History:  Diagnosis Date   Asthma    Bell's palsy    COPD (chronic obstructive pulmonary disease) (HCC)    Hyperlipidemia    Hypertension    Lyme disease    Type 2 diabetes mellitus (HCC)     Past Surgical History:  Procedure Laterality Date   CESAREAN SECTION     COLONOSCOPY WITH PROPOFOL  N/A 12/16/2021   Procedure: COLONOSCOPY WITH PROPOFOL ;  Surgeon: Eartha Angelia Sieving, MD;  [facility]: [facility];  Service: Gastroenterology;  Laterality: N/A;  945   HOT HEMOSTASIS  12/16/2021   Procedure: HOT HEMOSTASIS (ARGON PLASMA COAGULATION/BICAP);  Surgeon: Eartha Angelia, Sieving, MD;  Location: [facility];  Service: Gastroenterology;;   POLYPECTOMY  12/16/2021   Procedure: POLYPECTOMY;  Surgeon: Eartha Angelia Sieving, MD;  [facility]: [facility];  Service: Gastroenterology;;   RENAL BIOPSY  06/02/2022   SUBMUCOSAL TATTOO INJECTION  12/16/2021   Procedure: SUBMUCOSAL TATTOO INJECTION;  Surgeon: Eartha Angelia Sieving, MD;  Location: [facility];  Service: Gastroenterology;;    Social History   Socioeconomic History   Marital status: Widowed    Spouse name: Not on file   Number of children: Not on file   Years of education: Not on file   Highest education level: 12th grade  Occupational History   Not on file  Tobacco Use   Smoking status: Former    Current packs/day: 0.00    Types: Cigarettes    Quit date: 10/29/2020    Years since quitting: 3.7   Smokeless tobacco: Never  Vaping Use   Vaping status: Never Used  Substance and Sexual Activity   Alcohol use: Never   Drug use: Never   Sexual activity: Not on file   Other Topics Concern   Not on file  Social History Narrative   Not on file   Social Drivers of Health   Financial Resource Strain: Low Risk  (07/25/2023)   Overall Financial Resource Strain (CARDIA)    Difficulty of Paying Living Expenses: Not hard at all  Food Insecurity: No Food Insecurity (07/25/2023)   Hunger Vital Sign    Worried About Running Out of Food in the Last Year: Never true    Ran Out of Food in the Last Year: Never true  Transportation Needs: No Transportation Needs (07/25/2023)   PRAPARE - Administrator, Civil Service (Medical): No    Lack of Transportation (Non-Medical): No  Physical Activity: Sufficiently Active (07/25/2023)   Exercise Vital Sign    Days of Exercise per Week: 7 days    Minutes of Exercise per Session: 30 min  Stress: No Stress Concern Present (07/25/2023)   [employer] of Occupational Health - Occupational Stress Questionnaire    Feeling of Stress : Not at all  Social Connections: Unknown (07/25/2023)   Social Connection and Isolation Panel    Frequency of Communication with Friends and Family: Patient declined    Frequency of Social Gatherings with Friends and Family: Patient declined    Attends Religious Services: Patient declined    Active Member of Clubs or Organizations: Patient declined    Attends Club or  Organization Meetings: Patient declined    Marital Status: Widowed    Family History  Problem Relation Age of Onset   Thyroid  disease Mother    Hypertension Mother    Heart attack Mother    Stroke Mother    Heart failure Mother    Diabetes Mother    Emphysema Mother    Cancer Mother    Diabetes Father    Heart attack Father    Stroke Father    Kidney disease Father    Heart failure Father     Outpatient Encounter Medications as of 07/28/2024  Medication Sig   [DISCONTINUED] tirzepatide  (MOUNJARO ) 10 MG/0.5ML Pen Inject 10 mg into the skin once a week. (Patient taking differently: Inject 7.5 mg into the skin  once a week.)   albuterol  (PROVENTIL ) (2.5 MG/3ML) 0.083% nebulizer solution Take 3 mLs (2.5 mg total) by nebulization every 4 (four) hours as needed for wheezing or shortness of breath.   albuterol  (VENTOLIN  HFA) 108 (90 Base) MCG/ACT inhaler Inhale 2 puffs into the lungs every 4 (four) hours as needed for wheezing or shortness of breath.   amLODipine  (NORVASC ) 10 MG tablet TAKE 1 TABLET BY MOUTH EVERY DAY   atorvastatin  (LIPITOR) 80 MG tablet TAKE 1 TABLET BY MOUTH EVERY DAY   BD PEN NEEDLE NANO 2ND GEN 32G X 4 MM MISC USE 1 PEN NEEDLE FOR 6 INJECTIONS DAILY FOR DIABETES E11.9   BOOSTRIX  5-2.5-18.5 LF-MCG/0.5 injection    budesonide -formoterol  (SYMBICORT ) 160-4.5 MCG/ACT inhaler In event of flares:  take 2 puffs first thing in am and then another 2 puffs about 12 hours later.   budesonide -formoterol  (SYMBICORT ) 80-4.5 MCG/ACT inhaler TAKE 2 PUFFS FIRST THING IN AM AND THEN ANOTHER 2 PUFFS ABOUT 12 HOURS LATER   calcitRIOL (ROCALTROL) 0.25 MCG capsule Take 0.25 mcg by mouth 3 (three) times a week.   colchicine  0.6 MG tablet Take 1 tablet (0.6 mg total) by mouth 2 (two) times daily.   Continuous Blood Gluc Receiver (FREESTYLE LIBRE 2 READER) DEVI As directed   cyclobenzaprine  (FLEXERIL ) 5 MG tablet TAKE 1 TABLET BY MOUTH 2 TIMES DAILY AS NEEDED FOR MUSCLE SPASMS.   dapagliflozin  propanediol (FARXIGA ) 10 MG TABS tablet TAKE 1 TABLET BY MOUTH DAILY BEFORE BREAKFAST.   diclofenac (CATAFLAM) 50 MG tablet Take 50 mg by mouth daily as needed.   diclofenac Sodium (VOLTAREN) 1 % GEL Apply 1 Application topically 4 (four) times daily as needed (pain).   famotidine  (PEPCID ) 20 MG tablet TAKE 1 TABLET AFTER SUPPER   Ferrous Sulfate (IRON PO) Take 1 tablet by mouth daily.   fluticasone (FLONASE) 50 MCG/ACT nasal spray Place 1 spray into both nostrils daily as needed for allergies.   furosemide  (LASIX ) 20 MG tablet TAKE 1 TABLET BY MOUTH EVERY DAY   glucose blood (ACCU-CHEK AVIVA PLUS) test strip     HYDROcodone -acetaminophen  (NORCO/VICODIN) 5-325 MG tablet Take 1 tablet by mouth every 12 (twelve) hours as needed for severe pain.   insulin  glargine (LANTUS ) 100 UNIT/ML Solostar Pen Inject 70 Units into the skin at bedtime.   insulin  lispro (HUMALOG ) 100 UNIT/ML KwikPen INJECT 10-16 UNITS INTO THE SKIN 3 TIMES DAILY WITH MEALS   irbesartan  (AVAPRO ) 75 MG tablet TAKE 1 TABLET BY MOUTH EVERY DAY   KERENDIA 10 MG TABS Take 1 tablet by mouth daily.   naloxone (NARCAN) nasal spray 4 mg/0.1 mL Place 1 spray into the nose once.   nystatin cream (MYCOSTATIN) Apply 1 application  topically 2 (two) times  daily as needed (rash).   omeprazole  (PRILOSEC) 40 MG capsule Take 1 capsule (40 mg total) by mouth daily.   telmisartan (MICARDIS) 20 MG tablet Take 20 mg by mouth 2 (two) times daily.   tirzepatide  (MOUNJARO ) 10 MG/0.5ML Pen Inject 10 mg into the skin once a week.   VITAMIN D  PO Take 1 capsule by mouth daily.   [DISCONTINUED] metFORMIN  (GLUCOPHAGE ) 500 MG tablet TAKE 1 TABLET BY MOUTH TWICE A DAY WITH FOOD   No facility-administered encounter medications on file as of 07/28/2024.    ALLERGIES: Allergies  Allergen Reactions   Morphine  And Codeine Itching   Other Other (See Comments)    Bells Palsy    Oxycodone-Acetaminophen  Other (See Comments)    Constipates patient    Latex Itching and Other (See Comments)    Skin cracks open     VACCINATION STATUS: Immunization History  Administered Date(s) Administered   Moderna SARS-COV2 Booster Vaccination 09/03/2021   Moderna Sars-Covid-2 Vaccination 12/11/2019, 11/13/2020   PNEUMOCOCCAL CONJUGATE-20 10/06/2021   Tdap 03/13/2018   Zoster Recombinant(Shingrix) 10/06/2021, 02/06/2022    Diabetes She presents for her follow-up diabetic visit. She has type 2 diabetes mellitus. Onset time: She was diagnosed at approximately age of 54 years. Her disease course has been worsening. There are no hypoglycemic associated symptoms. Pertinent negatives  for hypoglycemia include no confusion, headaches, pallor or seizures. Pertinent negatives for diabetes include no chest pain, no fatigue, no polydipsia, no polyphagia and no polyuria. There are no hypoglycemic complications. Symptoms are worsening. Risk factors for coronary artery disease include dyslipidemia, diabetes mellitus, hypertension, obesity, sedentary lifestyle and tobacco exposure. Current diabetic treatment includes insulin  injections. Her weight is increasing steadily. She is following a generally unhealthy diet. When asked about meal planning, she reported none. She has not had a previous visit with a dietitian. She never participates in exercise. Her home blood glucose trend is decreasing steadily. Her breakfast blood glucose range is generally 140-180 mg/dl. Her lunch blood glucose range is generally 140-180 mg/dl. Her dinner blood glucose range is generally 140-180 mg/dl. Her bedtime blood glucose range is generally 140-180 mg/dl. Her overall blood glucose range is 140-180 mg/dl. (Christine Weaver presents with her CGM device showing average blood glucose of 173 over the last 14 days.  Her AGP report shows 71% time in range, 25% level 1 hyperglycemia, 4% level 2 hyperglycemia.  She did not document any hypoglycemia.  Her point-of-care A1c 7.6% increasing from 7.4% during her last visit.    ) An ACE inhibitor/angiotensin II receptor blocker is being taken. Eye exam is current.  Hyperlipidemia This is a chronic problem. The problem is uncontrolled. Recent lipid tests were reviewed and are high. Exacerbating diseases include diabetes and obesity. Pertinent negatives include no chest pain, myalgias or shortness of breath. Current antihyperlipidemic treatment includes statins. Risk factors for coronary artery disease include dyslipidemia, diabetes mellitus, family history, hypertension, obesity and a sedentary lifestyle.  Hypertension This is a chronic problem. The current episode started more than 1 year  ago. The problem is uncontrolled. Pertinent negatives include no chest pain, headaches, palpitations or shortness of breath. Risk factors for coronary artery disease include diabetes mellitus, dyslipidemia, obesity, sedentary lifestyle, smoking/tobacco exposure and family history. Past treatments include ACE inhibitors.   Review of systems: Limited as above.    Objective:       07/28/2024    9:14 AM 04/05/2024   10:22 PM 04/05/2024   10:21 PM  Vitals with BMI  Height 5' 0  5' 0  Weight 203 lbs  200 lbs 10 oz  BMI 39.65  39.18  Systolic 128 140   Diastolic 76 73   Pulse 84 97     BP 128/76   Pulse 84   Ht 5' (1.524 m)   Wt 203 lb (92.1 kg)   BMI 39.65 kg/m   Wt Readings from Last 3 Encounters:  07/28/24 203 lb (92.1 kg)  04/05/24 200 lb 9.9 oz (91 kg)  03/19/24 199 lb 12.8 oz (90.6 kg)     CMP ( most recent) CMP     Component Value Date/Time   NA 141 05/06/2024 0833   K 5.1 05/06/2024 0833   CL 102 05/06/2024 0833   CO2 23 05/06/2024 0833   GLUCOSE 80 05/06/2024 0833   GLUCOSE 184 (H) 12/14/2021 0901   BUN 20 05/06/2024 0833   CREATININE 1.51 (H) 05/06/2024 0833   CALCIUM  10.3 (H) 05/06/2024 0833   PROT 6.6 05/06/2024 0833   ALBUMIN 3.9 05/06/2024 0833   AST 16 05/06/2024 0833   ALT 16 05/06/2024 0833   ALKPHOS 144 (H) 05/06/2024 0833   BILITOT 0.3 05/06/2024 0833   GFRNONAA >60 12/14/2021 0901   GFRAA 80 11/11/2020 0817    Diabetic Labs (most recent): Lab Results  Component Value Date   HGBA1C 7.6 (A) 07/28/2024   HGBA1C 7.4 (A) 03/19/2024   HGBA1C 8.4 (A) 11/19/2023   MICROALBUR 150 03/19/2024     Lipid Panel ( most recent) Lipid Panel     Component Value Date/Time   CHOL 168 05/06/2024 0833   TRIG 331 (H) 05/06/2024 0833   HDL 33 (L) 05/06/2024 0833   CHOLHDL 5.1 (H) 05/06/2024 0833   LDLCALC 81 05/06/2024 0833   LABVLDL 54 (H) 05/06/2024 0833      Lab Results  Component Value Date   TSH 1.940 11/14/2023   TSH 3.110 04/06/2022   TSH  1.210 10/13/2021   TSH 1.730 01/27/2021   TSH 2.640 11/11/2020   TSH 1.42 09/23/2019   FREET4 1.13 11/14/2023   FREET4 1.25 04/06/2022   FREET4 1.25 01/27/2021   FREET4 1.28 11/11/2020      Assessment & Plan:   1. Type 2 diabetes mellitus with hyperglycemia, with long-term current use of insulin  (HCC)  - Christine Weaver has currently uncontrolled symptomatic type 2 DM since  58 years of age.  Christine Weaver presents with her CGM device showing average blood glucose of 173 over the last 14 days.  Her AGP report shows 71% time in range, 25% level 1 hyperglycemia, 4% level 2 hyperglycemia.  She did not document any hypoglycemia.  Her point-of-care A1c 7.6% increasing from 7.4% during her last visit.     Recent labs reviewed.  - I had a long discussion with her about the progressive nature of diabetes and the pathology behind its complications. -her diabetes is complicated by obesity/sedentary life, smoking and she remains at a high risk for more acute and chronic complications which include CAD, CVA, CKD, retinopathy, and neuropathy. These are all discussed in detail with her.   -  she is advised to stick to a routine mealtimes to eat 3 meals  a day and avoid unnecessary snacks ( to snack only to correct hypoglycemia).  - she acknowledges that there is a room for improvement in her food and drink choices. - Suggestion is made for her to avoid simple carbohydrates  from her diet including Cakes, Sweet Desserts, Ice Cream, Soda (diet and regular), Sweet Tea,  Candies, Chips, Cookies, Store Bought Juices, Alcohol in Excess of  1-2 drinks a day, Artificial Sweeteners,  Coffee Creamer, and Sugar-free Products, Lemonade. This will help patient to have more stable blood glucose profile and potentially avoid unintended weight gain.    - I have approached her with the following individualized plan to manage  her diabetes and patient agrees:    She will continue to need multiple modality treatments in  order for her to maintain control of diabetes to target.    -She presents with above target glycemic profile.  Accordingly, she is advised to continue Lantus  70 units nightly, continue Humalog  10-16 units 3 times daily AC for Premeal blood glucose readings above 90 mg per DL.   -She is advised to continue to use her CGM continuously, freestyle libre 2+ sensor is prescribed for her. - she is warned not to take insulin  without proper monitoring per orders.  - she is encouraged to call clinic for blood glucose levels less than 70 or above 200 mg /dl. - she presents with significant proteinuria.  She is advised to discontinue metformin  for now.  - She is advised to continue Farxiga  10 mg p.o. nightly.  She has tolerated Mounjaro  7.5, discussed and increased her dose to 10 mg subcutaneous weekly.  Side effects and precautions discussed with her.  - Specific targets for  A1c;  LDL, HDL,  and Triglycerides were discussed with the patient.  2) Blood Pressure /Hypertension:   Her blood pressure is controlled.  Finerenone 10 mg p.o. daily was prescribed by her nephrologist, advised to continue in light of the fact that she has significant proteinuria.   she is advised to continue her current medications including lisinopril  20 mg p.o. daily with breakfast .   3) Lipids/Hyperlipidemia:   Review of her recent lipid panel showed improved lipid panel with LDL at 81.  She is advised to continue atorvastatin  80 mg p.o. nightly.    She will have fasting lipid panel before next visit.   Whole food plant-based diet was discussed and recommended to her.     4)  Weight/Diet: Her BMI is 39.65-- clearly complicating her diabetes care.   she is  a candidate for weight loss. I discussed with her the fact that loss of 5 - 10% of her  current body weight will have the most impact on her diabetes management.  Exercise, and detailed carbohydrates information provided  -  detailed on discharge instructions.  5) Chronic  Care/Health Maintenance:  -she  is on ACEI/ARB and Statin medications and  is encouraged to initiate and continue to follow up with Ophthalmology, Dentist,  Podiatrist at least yearly or according to recommendations, and advised to  quit smoking. I have recommended yearly flu vaccine and pneumonia vaccine at least every 5 years; moderate intensity exercise for up to 150 minutes weekly; and  sleep for at least 7 hours a day.  Her recent screening ABI was negative for PAD in February 2022.  Her next study will be due in February 2027 or sooner if needed.  Her diabetes foot exam was normal except for bilateral calluses July 28, 2024 - she is  advised to maintain close follow up with Tobie Suzzane POUR, MD for primary care needs, as well as her other providers for optimal and coordinated care.   I spent  43  minutes in the care of the patient today including review of labs from CMP, Lipids, Thyroid  Function, Hematology (current and previous including abstractions  from other facilities); face-to-face time discussing  her blood glucose readings/logs, discussing hypoglycemia and hyperglycemia episodes and symptoms, medications doses, her options of short and long term treatment based on the latest standards of care / guidelines;  discussion about incorporating lifestyle medicine;  and documenting the encounter. Risk reduction counseling performed per USPSTF guidelines to reduce  obesity and cardiovascular risk factors.     Please refer to Patient Instructions for Blood Glucose Monitoring and Insulin /Medications Dosing Guide  in media tab for additional information. Please  also refer to  Patient Self Inventory in the Media  tab for reviewed elements of pertinent patient history.  Christine Weaver participated in the discussions, expressed understanding, and voiced agreement with the above plans.  All questions were answered to her satisfaction. she is encouraged to contact clinic should she have any  questions or concerns prior to her return visit.      Follow up plan: - Return in about 3 months (around 10/27/2024) for Bring Meter/CGM Device/Logs- A1c in Office.  Ranny Earl, MD [facility] [address] [place], [place] [address] Phone: [phone]  Fax: [phone]    07/28/2024, 11:55 AM  This note was partially dictated with voice recognition software. Similar sounding words can be transcribed inadequately or may not  be corrected upon review.

## 2024-07-30 ENCOUNTER — Ambulatory Visit: Payer: 59

## 2024-07-30 VITALS — Ht 60.0 in | Wt 203.0 lb

## 2024-07-30 DIAGNOSIS — Z0000 Encounter for general adult medical examination without abnormal findings: Secondary | ICD-10-CM

## 2024-07-30 DIAGNOSIS — Z1231 Encounter for screening mammogram for malignant neoplasm of breast: Secondary | ICD-10-CM

## 2024-07-30 NOTE — Patient Instructions (Addendum)
 [name],  Thank you for taking the time for your Medicare Wellness Visit. I appreciate your continued commitment to your health goals. Please review the care plan we discussed, and feel free to reach out if I can assist you further.  Medicare recommends these wellness visits once per year to help you and your care team stay ahead of potential health issues. These visits are designed to focus on prevention, allowing your provider to concentrate on managing your acute and chronic conditions during your regular appointments.  Please note that Annual Wellness Visits do not include a physical exam. Some assessments may be limited, especially if the visit was conducted virtually. If needed, we may recommend a separate in-person follow-up with your provider.  Ongoing Care  Seeing your primary care provider every 3 to 6 months helps us  monitor your health and provide consistent, personalized care.  Referrals  Mammogram at [facility] Call [phone] to schedule your screening No perfumes, lotions, or deodorants the day of your screening. You can schedule your mammogram through mychart!   Recommended Screenings:  Health Maintenance  Topic Date Due   Hepatitis B Vaccine (1 of 3 - 19+ 3-dose series) Never done   Eye exam for diabetics  05/31/2022   Breast Cancer Screening  06/20/2022   Flu Shot  Never done   COVID-19 Vaccine (4 - 2025-26 season) 06/30/2024   Colon Cancer Screening  12/16/2024   Hemoglobin A1C  01/25/2025   Yearly kidney function blood test for diabetes  05/06/2025   Yearly kidney health urinalysis for diabetes  05/06/2025   Complete foot exam   07/28/2025   Medicare Annual Wellness Visit  07/30/2025   Pap with HPV screening  07/11/2026   DTaP/Tdap/Td vaccine (2 - Td or Tdap) 03/13/2028   Pneumococcal Vaccine for age over 99  Completed   Hepatitis C Screening  Completed   HIV Screening  Completed   Zoster (Shingles) Vaccine  Completed   HPV Vaccine  Aged Out    Meningitis B Vaccine  Aged Out       07/30/2024    9:28 AM  Advanced Directives  Does Patient Have a Medical Advance Directive? No  Would patient like information on creating a medical advance directive? No - Patient declined    Advance Care Planning is important because it: Ensures you receive medical care that aligns with your values, goals, and preferences. Provides guidance to your family and loved ones, reducing the emotional burden of decision-making during critical moments.  Vision: Annual vision screenings are recommended for early detection of glaucoma, cataracts, and diabetic retinopathy. These exams can also reveal signs of chronic conditions such as diabetes and high blood pressure.  Dental: Annual dental screenings help detect early signs of oral cancer, gum disease, and other conditions linked to overall health, including heart disease and diabetes.  Please see the attached documents for additional preventive care recommendations.

## 2024-07-30 NOTE — Progress Notes (Signed)
 Subjective:   Christine Weaver is a 58 y.o. who presents for a Medicare Wellness preventive visit.  As a reminder, Annual Wellness Visits don't include a physical exam, and some assessments may be limited, especially if this visit is performed virtually. We may recommend an in-person follow-up visit with your provider if needed.  Visit Complete: Virtual I connected with  Christine Weaver on 07/30/24 by a video and audio enabled telemedicine application and verified that I am speaking with the correct person using two identifiers.  Patient Location: Home  Provider Location: Home Office  I discussed the limitations of evaluation and management by telemedicine. The patient expressed understanding and agreed to proceed.  Vital Signs: Because this visit was a virtual/telehealth visit, some criteria may be missing or patient reported. Any vitals not documented were not able to be obtained and vitals that have been documented are patient reported.  Persons Participating in Visit: Patient.  AWV Questionnaire: No: Patient Medicare AWV questionnaire was not completed prior to this visit.  Cardiac Risk Factors include: advanced age (>35men, >42 women);diabetes mellitus;dyslipidemia;hypertension;obesity (BMI >30kg/m2);sedentary lifestyle     Objective:    Today's Vitals   07/30/24 0936 07/30/24 0940  Weight: 203 lb (92.1 kg)   Height: 5' (1.524 m)   PainSc: 7  7   PainLoc: Back    Body mass index is 39.65 kg/m.     07/30/2024    9:28 AM 04/05/2024   10:21 PM 07/25/2023    2:01 PM 07/17/2022    8:49 AM 12/16/2021    7:52 AM 07/15/2021    2:10 PM 12/10/2020    4:45 PM  Advanced Directives  Does Patient Have a Medical Advance Directive? No No No No No No No  Would patient like information on creating a medical advance directive? No - Patient declined  No - Patient declined No - Patient declined No - Patient declined      Current Medications (verified) Outpatient Encounter Medications as of  07/30/2024  Medication Sig   albuterol  (PROVENTIL ) (2.5 MG/3ML) 0.083% nebulizer solution Take 3 mLs (2.5 mg total) by nebulization every 4 (four) hours as needed for wheezing or shortness of breath.   albuterol  (VENTOLIN  HFA) 108 (90 Base) MCG/ACT inhaler Inhale 2 puffs into the lungs every 4 (four) hours as needed for wheezing or shortness of breath.   amLODipine  (NORVASC ) 10 MG tablet TAKE 1 TABLET BY MOUTH EVERY DAY   atorvastatin  (LIPITOR) 80 MG tablet TAKE 1 TABLET BY MOUTH EVERY DAY   BD PEN NEEDLE NANO 2ND GEN 32G X 4 MM MISC USE 1 PEN NEEDLE FOR 6 INJECTIONS DAILY FOR DIABETES E11.9   BOOSTRIX  5-2.5-18.5 LF-MCG/0.5 injection    budesonide -formoterol  (SYMBICORT ) 160-4.5 MCG/ACT inhaler In event of flares:  take 2 puffs first thing in am and then another 2 puffs about 12 hours later.   budesonide -formoterol  (SYMBICORT ) 80-4.5 MCG/ACT inhaler TAKE 2 PUFFS FIRST THING IN AM AND THEN ANOTHER 2 PUFFS ABOUT 12 HOURS LATER   calcitRIOL (ROCALTROL) 0.25 MCG capsule Take 0.25 mcg by mouth 3 (three) times a week.   colchicine  0.6 MG tablet Take 1 tablet (0.6 mg total) by mouth 2 (two) times daily.   Continuous Blood Gluc Receiver (FREESTYLE LIBRE 2 READER) DEVI As directed   Continuous Glucose Sensor (FREESTYLE LIBRE 2 PLUS SENSOR) MISC Change sensor every 15 days.   cyclobenzaprine  (FLEXERIL ) 5 MG tablet TAKE 1 TABLET BY MOUTH 2 TIMES DAILY AS NEEDED FOR MUSCLE SPASMS.   dapagliflozin  propanediol (FARXIGA )  10 MG TABS tablet TAKE 1 TABLET BY MOUTH DAILY BEFORE BREAKFAST.   diclofenac (CATAFLAM) 50 MG tablet Take 50 mg by mouth daily as needed.   diclofenac Sodium (VOLTAREN) 1 % GEL Apply 1 Application topically 4 (four) times daily as needed (pain).   famotidine  (PEPCID ) 20 MG tablet TAKE 1 TABLET AFTER SUPPER   Ferrous Sulfate (IRON PO) Take 1 tablet by mouth daily.   fluticasone (FLONASE) 50 MCG/ACT nasal spray Place 1 spray into both nostrils daily as needed for allergies.   furosemide  (LASIX )  20 MG tablet TAKE 1 TABLET BY MOUTH EVERY DAY   glucose blood (ACCU-CHEK AVIVA PLUS) test strip    HYDROcodone -acetaminophen  (NORCO/VICODIN) 5-325 MG tablet Take 1 tablet by mouth every 12 (twelve) hours as needed for severe pain.   insulin  glargine (LANTUS ) 100 UNIT/ML Solostar Pen Inject 70 Units into the skin at bedtime.   insulin  lispro (HUMALOG ) 100 UNIT/ML KwikPen INJECT 10-16 UNITS INTO THE SKIN 3 TIMES DAILY WITH MEALS   irbesartan  (AVAPRO ) 75 MG tablet TAKE 1 TABLET BY MOUTH EVERY DAY   KERENDIA 10 MG TABS Take 1 tablet by mouth daily.   naloxone (NARCAN) nasal spray 4 mg/0.1 mL Place 1 spray into the nose once.   nystatin cream (MYCOSTATIN) Apply 1 application  topically 2 (two) times daily as needed (rash).   omeprazole  (PRILOSEC) 40 MG capsule Take 1 capsule (40 mg total) by mouth daily.   telmisartan (MICARDIS) 20 MG tablet Take 20 mg by mouth 2 (two) times daily.   tirzepatide  (MOUNJARO ) 10 MG/0.5ML Pen Inject 10 mg into the skin once a week.   VITAMIN D  PO Take 1 capsule by mouth daily.   No facility-administered encounter medications on file as of 07/30/2024.    Allergies (verified) Morphine  and codeine, Other, Oxycodone-acetaminophen , and Latex   History: Past Medical History:  Diagnosis Date   Asthma    Bell's palsy    COPD (chronic obstructive pulmonary disease) (HCC)    Hyperlipidemia    Hypertension    Lyme disease    Type 2 diabetes mellitus (HCC)    Past Surgical History:  Procedure Laterality Date   CESAREAN SECTION     COLONOSCOPY WITH PROPOFOL  N/A 12/16/2021   Procedure: COLONOSCOPY WITH PROPOFOL ;  Surgeon: Eartha Angelia Sieving, MD;  Location: [facility];  Service: Gastroenterology;  Laterality: N/A;  945   HOT HEMOSTASIS  12/16/2021   Procedure: HOT HEMOSTASIS (ARGON PLASMA COAGULATION/BICAP);  Surgeon: Eartha Angelia, Sieving, MD;  Location: [facility];  Service: Gastroenterology;;   POLYPECTOMY  12/16/2021   Procedure: POLYPECTOMY;   Surgeon: Eartha Angelia Sieving, MD;  Location: [facility];  Service: Gastroenterology;;   RENAL BIOPSY  06/02/2022   SUBMUCOSAL TATTOO INJECTION  12/16/2021   Procedure: SUBMUCOSAL TATTOO INJECTION;  Surgeon: Eartha Angelia Sieving, MD;  Location: [facility];  Service: Gastroenterology;;   Family History  Problem Relation Age of Onset   Thyroid  disease Mother    Hypertension Mother    Heart attack Mother    Stroke Mother    Heart failure Mother    Diabetes Mother    Emphysema Mother    Cancer Mother    Diabetes Father    Heart attack Father    Stroke Father    Kidney disease Father    Heart failure Father    Social History   Socioeconomic History   Marital status: Widowed    Spouse name: Not on file   Number of children: Not  on file   Years of education: Not on file   Highest education level: 12th grade  Occupational History   Not on file  Tobacco Use   Smoking status: Former    Current packs/day: 0.00    Average packs/day: 0.5 packs/day for 33.0 years (16.5 ttl pk-yrs)    Types: Cigarettes    Start date: 38    Quit date: 10/29/2020    Years since quitting: 3.7   Smokeless tobacco: Never  Vaping Use   Vaping status: Never Used  Substance and Sexual Activity   Alcohol use: Never   Drug use: Never   Sexual activity: Not on file  Other Topics Concern   Not on file  Social History Narrative   Not on file   Social Drivers of Health   Financial Resource Strain: Low Risk  (07/30/2024)   Overall Financial Resource Strain (CARDIA)    Difficulty of Paying Living Expenses: Not hard at all  Food Insecurity: No Food Insecurity (07/30/2024)   Hunger Vital Sign    Worried About Running Out of Food in the Last Year: Never true    Ran Out of Food in the Last Year: Never true  Transportation Needs: No Transportation Needs (07/30/2024)   PRAPARE - Administrator, Civil Service (Medical): No    Lack of Transportation (Non-Medical): No  Physical  Activity: Sufficiently Active (07/30/2024)   Exercise Vital Sign    Days of Exercise per Week: 7 days    Minutes of Exercise per Session: 30 min  Stress: No Stress Concern Present (07/30/2024)   [employer] of Occupational Health - Occupational Stress Questionnaire    Feeling of Stress: Not at all  Social Connections: Patient Declined (07/30/2024)   Social Connection and Isolation Panel    Frequency of Communication with Friends and Family: Patient declined    Frequency of Social Gatherings with Friends and Family: Patient declined    Attends Religious Services: Patient declined    Database administrator or Organizations: Patient declined    Attends Engineer, structural: Patient declined    Marital Status: Patient declined    Tobacco Counseling Counseling given: Yes    Clinical Intake:  Pre-visit preparation completed: Yes  Pain : 0-10 Pain Score: 7  Pain Type: Chronic pain Pain Location: Back Pain Orientation: Lower Pain Descriptors / Indicators: Constant Pain Onset: More than a month ago Pain Frequency: Constant     BMI - recorded: 39.65 Nutritional Status: BMI > 30  Obese Nutritional Risks: None Diabetes: Yes CBG done?: No Did pt. bring in CBG monitor from home?: No  Lab Results  Component Value Date   HGBA1C 7.6 (A) 07/28/2024   HGBA1C 7.4 (A) 03/19/2024   HGBA1C 8.4 (A) 11/19/2023     How often do you need to have someone help you when you read instructions, pamphlets, or other written materials from your doctor or pharmacy?: 1 - Never  Interpreter Needed?: No  Information entered by :: Jorey Dollard W CMA (AAMA)   Activities of Daily Living     07/30/2024    9:46 AM  In your present state of health, do you have any difficulty performing the following activities:  Hearing? 0  Vision? 0  Difficulty concentrating or making decisions? 0  Walking or climbing stairs? 0  Dressing or bathing? 0  Doing errands, shopping? 0  Preparing Food and eating  ? N  Using the Toilet? N  In the past six months, have you accidently  leaked urine? N  Do you have problems with loss of bowel control? N  Managing your Medications? N  Managing your Finances? N  Housekeeping or managing your Housekeeping? N    Patient Care Team: Tobie Suzzane POUR, MD as PCP - General (Internal Medicine) Debera Jayson MATSU, MD as Consulting Physician (Cardiology) Lenis Ethelle ORN, MD as Consulting Physician (Endocrinology) Darlean Ozell NOVAK, MD as Consulting Physician (Pulmonary Disease) Rachele Gaynell RAMAN, MD as Referring Physician (Nephrology) Clide Boss, MD as Referring Physician (Anesthesiology) Eartha Flavors, Toribio, MD as Consulting Physician (Gastroenterology) Lorence Ozell CROME, MD (Inactive) as Consulting Physician (Obstetrics and Gynecology)  I have updated your Care Teams any recent Medical Services you may have received from other providers in the past year.     Assessment:   This is a routine wellness examination for Christine Weaver.  Hearing/Vision screen Hearing Screening - Comments:: Patient denies any hearing difficulties.  Vision Screening - Comments:: Patient is not up to date on eye exams. She is on a waiting list to establish at [facility] in [facility] with Dr. Ladora   Goals Addressed               This Visit's Progress     See my kid graduate college (pt-stated)          Depression Screen     07/30/2024    9:47 AM 07/25/2023    2:11 PM 06/21/2023    9:58 AM 05/14/2023   10:10 AM 12/14/2022    1:45 PM 12/12/2022    3:31 PM 07/17/2022    8:50 AM  PHQ 2/9 Scores  PHQ - 2 Score 0 0 0 0 0 0 0  PHQ- 9 Score 0 0 0   0      Fall Risk     07/30/2024    9:44 AM 07/25/2023    2:17 PM 06/21/2023    9:58 AM 05/14/2023   10:10 AM 12/14/2022    1:45 PM  Fall Risk   Falls in the past year? 1 0 0 1 0  Number falls in past yr: 0 0 0 0 0  Injury with Fall? 0 0 0 0 0  Risk for fall due to : History of fall(s);Impaired  balance/gait No Fall Risks     Follow up Falls evaluation completed;Education provided;Falls prevention discussed Falls prevention discussed       MEDICARE RISK AT HOME:  Medicare Risk at Home Any stairs in or around the home?: Yes If so, are there any without handrails?: No Home free of loose throw rugs in walkways, pet beds, electrical cords, etc?: Yes Adequate lighting in your home to reduce risk of falls?: Yes Life alert?: No Use of a cane, walker or w/c?: Yes (as needed for sciatica flares) Grab bars in the bathroom?: No Shower chair or bench in shower?: No Elevated toilet seat or a handicapped toilet?: No  TIMED UP AND GO:  Was the test performed?  No  Cognitive Function: 6CIT completed    07/17/2022    8:50 AM  MMSE - Mini Mental State Exam  Not completed: Unable to complete        07/30/2024    9:46 AM 07/25/2023    2:11 PM 07/17/2022    8:50 AM 07/15/2021    2:11 PM  6CIT Screen  What Year? 0 points 0 points 0 points 0 points  What month? 0 points 0 points 0 points 0 points  What time? 0 points 0 points  0 points 0 points  Count back from 20 0 points 0 points 0 points 0 points  Months in reverse 0 points 0 points 0 points 0 points  Repeat phrase 0 points 0 points 0 points 0 points  Total Score 0 points 0 points 0 points 0 points    Immunizations Immunization History  Administered Date(s) Administered   Moderna SARS-COV2 Booster Vaccination 09/03/2021   Moderna Sars-Covid-2 Vaccination 12/11/2019, 11/13/2020   PNEUMOCOCCAL CONJUGATE-20 10/06/2021   Tdap 03/13/2018   Zoster Recombinant(Shingrix) 10/06/2021, 02/06/2022    Screening Tests Health Maintenance  Topic Date Due   Hepatitis B Vaccines 19-59 Average Risk (1 of 3 - 19+ 3-dose series) Never done   OPHTHALMOLOGY EXAM  05/31/2022   Mammogram  06/20/2022   Influenza Vaccine  Never done   COVID-19 Vaccine (4 - 2025-26 season) 06/30/2024   Colonoscopy  12/16/2024   HEMOGLOBIN A1C  01/25/2025    Diabetic kidney evaluation - eGFR measurement  05/06/2025   Diabetic kidney evaluation - Urine ACR  05/06/2025   FOOT EXAM  07/28/2025   Medicare Annual Wellness (AWV)  07/30/2025   Cervical Cancer Screening (HPV/Pap Cotest)  07/11/2026   DTaP/Tdap/Td (2 - Td or Tdap) 03/13/2028   Pneumococcal Vaccine: 50+ Years  Completed   Hepatitis C Screening  Completed   HIV Screening  Completed   Zoster Vaccines- Shingrix  Completed   HPV VACCINES  Aged Out   Meningococcal B Vaccine  Aged Out    Health Maintenance Health Maintenance Due  Topic Date Due   Hepatitis B Vaccines 19-59 Average Risk (1 of 3 - 19+ 3-dose series) Never done   OPHTHALMOLOGY EXAM  05/31/2022   Mammogram  06/20/2022   Influenza Vaccine  Never done   COVID-19 Vaccine (4 - 2025-26 season) 06/30/2024   Health Maintenance Items Addressed: Mammogram ordered  Additional Screening:  Vision Screening: Recommended annual ophthalmology exams for early detection of glaucoma and other disorders of the eye. Would you like a referral to an eye doctor? No    Dental Screening: Recommended annual dental exams for proper oral hygiene  Community Resource Referral / Chronic Care Management: CRR required this visit?  No   CCM required this visit?  No   Plan:    I have personally reviewed and noted the following in the patient's chart:   Medical and social history Use of alcohol, tobacco or illicit drugs  Current medications and supplements including opioid prescriptions. Patient is currently taking opioid prescriptions. Information provided to patient regarding non-opioid alternatives. Patient advised to discuss non-opioid treatment plan with their provider. Functional ability and status Nutritional status Physical activity Advanced directives List of other physicians Hospitalizations, surgeries, and ER visits in previous 12 months Vitals Screenings to include cognitive, depression, and falls Referrals and  appointments  In addition, I have reviewed and discussed with patient certain preventive protocols, quality metrics, and best practice recommendations. A written personalized care plan for preventive services as well as general preventive health recommendations were provided to patient.   Guerin Lashomb, CMA   07/30/2024   After Visit Summary: (MyChart) Due to this being a telephonic visit, the after visit summary with patients personalized plan was offered to patient via MyChart   Notes: Nothing significant to report at this time.

## 2024-08-02 ENCOUNTER — Other Ambulatory Visit: Payer: Self-pay | Admitting: Internal Medicine

## 2024-08-02 DIAGNOSIS — N1831 Chronic kidney disease, stage 3a: Secondary | ICD-10-CM

## 2024-08-11 ENCOUNTER — Other Ambulatory Visit: Payer: Self-pay | Admitting: Internal Medicine

## 2024-08-11 DIAGNOSIS — M5431 Sciatica, right side: Secondary | ICD-10-CM

## 2024-08-13 ENCOUNTER — Other Ambulatory Visit: Payer: Self-pay

## 2024-08-13 DIAGNOSIS — E119 Type 2 diabetes mellitus without complications: Secondary | ICD-10-CM

## 2024-08-13 MED ORDER — INSULIN LISPRO (1 UNIT DIAL) 100 UNIT/ML (KWIKPEN): PEN_INJECTOR | SUBCUTANEOUS | 0 refills | Status: AC

## 2024-08-15 DIAGNOSIS — G8929 Other chronic pain: Secondary | ICD-10-CM | POA: Diagnosis not present

## 2024-08-15 DIAGNOSIS — E1142 Type 2 diabetes mellitus with diabetic polyneuropathy: Secondary | ICD-10-CM | POA: Diagnosis not present

## 2024-08-15 DIAGNOSIS — M5136 Other intervertebral disc degeneration, lumbar region with discogenic back pain only: Secondary | ICD-10-CM | POA: Diagnosis not present

## 2024-08-15 DIAGNOSIS — M545 Low back pain, unspecified: Secondary | ICD-10-CM | POA: Diagnosis not present

## 2024-08-16 DIAGNOSIS — I509 Heart failure, unspecified: Secondary | ICD-10-CM | POA: Diagnosis not present

## 2024-08-16 DIAGNOSIS — J449 Chronic obstructive pulmonary disease, unspecified: Secondary | ICD-10-CM | POA: Diagnosis not present

## 2024-08-18 ENCOUNTER — Ambulatory Visit: Admitting: Internal Medicine

## 2024-08-18 ENCOUNTER — Encounter: Payer: Self-pay | Admitting: Internal Medicine

## 2024-08-18 VITALS — BP 126/75 | HR 91 | Ht 60.0 in | Wt 202.8 lb

## 2024-08-18 DIAGNOSIS — R490 Dysphonia: Secondary | ICD-10-CM | POA: Diagnosis not present

## 2024-08-18 DIAGNOSIS — J45901 Unspecified asthma with (acute) exacerbation: Secondary | ICD-10-CM | POA: Diagnosis not present

## 2024-08-18 DIAGNOSIS — J4489 Other specified chronic obstructive pulmonary disease: Secondary | ICD-10-CM

## 2024-08-18 DIAGNOSIS — I10 Essential (primary) hypertension: Secondary | ICD-10-CM

## 2024-08-18 DIAGNOSIS — J9611 Chronic respiratory failure with hypoxia: Secondary | ICD-10-CM

## 2024-08-18 MED ORDER — PREDNISONE 10 MG PO TABS: ORAL_TABLET | ORAL | 0 refills | Status: AC

## 2024-08-18 NOTE — Progress Notes (Unsigned)
 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  06/30/2021 by Dr  Debera for chronic hypoxemic RF.  Quit work 2004 at 220 lb and gained up  to 250 until husband died in 2020/10/20  After moving to [facility] 2012 saw [facility] doctor feeling sluggish and dx with asthma > meds didn't help much then started doe which seemed better with alb neb then added advair around 2014/15 then 02     History of Present Illness  06/30/2021  Pulmonary/ 1st office eval/ Christine Weaver / [place] [facility]  Chief Complaint  Patient presents with   Follow-up    Pt. Is on 2L O2 when sleeping and out running errands but tries not to wear it in the house unless she needs it. Coughing but not normally coughing up mucus.   Dyspnea:  foodlion 50% using 02 2lpm/  Cough: always coughing some very hoarse x years >minimally productive  Sleep: 45 degrees with pillows  SABA use: no longer needing since 02  02 :  2lpm at bedtime and prn daytime  Intol of dpi  Rec Omeprazole  40 mg Take  30-60 min before first meal of the day and Pepcid  (famotidine )  20 mg after supper until return to office - this is the best way to tell whether stomach acid is contributing to your problem.   GERD rx Stop advair and start symbicort  80 Take 2 puffs first thing in am and then another 2 puffs about 12 hours later.  Work on inhaler technique: SABRA  Make sure you check your oxygen  saturation  at your highest level of activity   Please schedule a follow up office visit in 6-8  weeks, call sooner if needed with pfts on return Rec I will be referring you to ENT for chronic hoarseness > not done as of 08/18/2024      08/20/2023  35m f/u ov/[place] office/Christine Weaver re: GOLD 0/AB  maint on symbiocrt 80 and at least 3 x yearly  with uri  and allergy fall  > spring with rec benadryl per [place] allergist  Chief Complaint   Patient presents with   Hoarse  Dyspnea:  working out on 02 2lpm POC on treadmill x 30 min  Cough: ok / still hoarse  Sleeping: flat bed/ 2 pillows s    resp cc  SABA use: none now / need solution  02: 1.5 lpm hs and prn Lung cancer screening:due q June  Rec Make sure you check your oxygen  saturation  AT  your highest level of activity (not after you stop)   to be sure it stays over 90%   Plan A = Automatic = Always=    Symbicort  80 Take 2 puffs first thing in am and then another 2 puffs about 12 hours later.  Plan B = Backup (to supplement plan A, not to replace it) Only use your albuterol  inhaler as a rescue medication Plan C = Crisis (instead of Plan B but only if Plan B stops working) - only use your albuterol  nebulizer if you first try Plan B Plan D = when start allergy or cold change to Symbicort  160 Take 2 puffs first thing in am and then another 2 puffs about 12 hours later.  My office will be contacting you by phone for referral to ENT  Please schedule a  follow up visit in 6  months but call sooner if needed    02/18/2024  6 m f/u ov/[place] office/Christine Weaver re: GOLD 0 / AB maint on symbicort   80 and changes to 160 for flares  which recently did with successful outcome s need for prednisone   Chief Complaint  Patient presents with   Asthma  Dyspnea:  yard work off 02  Cough: none  Sleeping: flat bed/ 2 pillows s    resp cc  SABA use: rarely using prn  02: bedtime x 1.5 lpm dry nose Lung cancer screening: q June  Rec My office will be contacting you by phone for referral to [facility] ENT   Work on inhaler technique:     LDST  05/07/23 RADS 0/ incomplete/Mild emphysema  Possible acute superimposed inflammatory process. 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    Also denies any obvious fluctuation of symptoms with weather or environmental changes or other aggravating or alleviating factors except as outlined above   No unusual exposure hx or h/o childhood pna/ asthma or knowledge of premature birth.  Current Allergies, Complete Past Medical History, Past Surgical History, Family History, and Social History were reviewed in [employer] record.  ROS  The following are not active complaints unless bolded Hoarseness, sore throat, dysphagia, dental problems, itching, sneezing,  nasal congestion or discharge of excess mucus or purulent secretions, ear ache,   fever, chills, sweats, unintended wt loss or wt gain, classically pleuritic or exertional cp,  orthopnea pnd or arm/hand swelling  or leg swelling, presyncope, palpitations, abdominal pain, anorexia, nausea, vomiting, diarrhea  or change in bowel habits or change in bladder habits, change in stools or change in urine, dysuria, hematuria,  rash, arthralgias, visual complaints, headache, numbness, weakness or ataxia or problems with walking or coordination,  change in mood or  memory.        Current Meds  Medication Sig   albuterol  (PROVENTIL ) (2.5 MG/3ML) 0.083% nebulizer solution Take 3 mLs (2.5 mg total) by nebulization every 4 (four) hours as needed for wheezing or shortness of breath.   albuterol  (VENTOLIN  HFA) 108 (90 Base) MCG/ACT inhaler Inhale 2 puffs into the lungs every 4 (four) hours as needed for wheezing or shortness of breath.   amLODipine  (NORVASC ) 10 MG tablet TAKE 1 TABLET  BY MOUTH EVERY DAY   atorvastatin  (LIPITOR) 80 MG tablet TAKE 1 TABLET BY MOUTH EVERY DAY   BD PEN NEEDLE NANO 2ND GEN 32G X 4 MM MISC USE 1 PEN NEEDLE FOR 6 INJECTIONS DAILY FOR DIABETES E11.9   BOOSTRIX  5-2.5-18.5 LF-MCG/0.5 injection    budesonide -formoterol  (SYMBICORT ) 160-4.5 MCG/ACT inhaler In event of flares:  take 2 puffs first thing in am and then another 2 puffs about 12 hours later.   budesonide -formoterol  (SYMBICORT )  80-4.5 MCG/ACT inhaler TAKE 2 PUFFS FIRST THING IN AM AND THEN ANOTHER 2 PUFFS ABOUT 12 HOURS LATER   calcitRIOL (ROCALTROL) 0.25 MCG capsule Take 0.25 mcg by mouth 3 (three) times a week.   colchicine  0.6 MG tablet Take 1 tablet (0.6 mg total) by mouth 2 (two) times daily.   Continuous Blood Gluc Receiver (FREESTYLE LIBRE 2 READER) DEVI As directed   Continuous Glucose Sensor (FREESTYLE LIBRE 2 PLUS SENSOR) MISC Change sensor every 15 days.   cyclobenzaprine  (FLEXERIL ) 5 MG tablet TAKE 1 TABLET BY MOUTH 2 TIMES DAILY AS NEEDED FOR MUSCLE SPASMS.   dapagliflozin  propanediol (FARXIGA ) 10 MG TABS tablet TAKE 1 TABLET BY MOUTH DAILY BEFORE BREAKFAST.   diclofenac (CATAFLAM) 50 MG tablet Take 50 mg by mouth daily as needed.   diclofenac Sodium (VOLTAREN) 1 % GEL Apply 1 Application topically 4 (four) times daily as needed (pain).   famotidine  (PEPCID ) 20 MG tablet TAKE 1 TABLET AFTER SUPPER   Ferrous Sulfate (IRON PO) Take 1 tablet by mouth daily.   fluticasone (FLONASE) 50 MCG/ACT nasal spray Place 1 spray into both nostrils daily as needed for allergies.   furosemide  (LASIX ) 20 MG tablet TAKE 1 TABLET BY MOUTH EVERY DAY   glucose blood (ACCU-CHEK AVIVA PLUS) test strip    HYDROcodone -acetaminophen  (NORCO/VICODIN) 5-325 MG tablet Take 1 tablet by mouth every 12 (twelve) hours as needed for severe pain.   insulin  glargine (LANTUS ) 100 UNIT/ML Solostar Pen Inject 70 Units into the skin at bedtime.   insulin  lispro (HUMALOG ) 100 UNIT/ML KwikPen INJECT 10-16 UNITS INTO THE SKIN  3 TIMES DAILY WITH MEALS   irbesartan  (AVAPRO ) 75 MG tablet TAKE 1 TABLET BY MOUTH EVERY DAY   KERENDIA 10 MG TABS Take 1 tablet by mouth daily.   naloxone (NARCAN) nasal spray 4 mg/0.1 mL Place 1 spray into the nose once.   nystatin cream (MYCOSTATIN) Apply 1 application  topically 2 (two) times daily as needed (rash).   omeprazole  (PRILOSEC) 40 MG capsule Take 1 capsule (40 mg total) by mouth daily.   telmisartan (MICARDIS) 20 MG tablet Take 20 mg by mouth 2 (two) times daily.   tirzepatide  (MOUNJARO ) 10 MG/0.5ML Pen Inject 10 mg into the skin once a week.   VITAMIN D  PO Take 1 capsule by mouth daily.              Past Medical History:  Diagnosis Date   Asthma    COPD (chronic obstructive pulmonary disease) (HCC)    Hyperlipidemia    Hypertension    Type 2 diabetes mellitus (HCC)         Objective:    Wts  08/18/2024      203 02/18/2024        203   08/20/2023     199  02/19/2023       206 11/16/2022       200  03/06/2022         206   08/30/21 212 lb (96.2 kg)  07/11/21 209 lb 6.4 oz (95 kg)  06/30/21 210 lb 1.6 oz (95.3 kg)    Vital signs reviewed  02/18/2024  - Note at rest 02 sats  94% on RA      General appearance:    Mod obese (by BMI) amb very hoarse wf/ prominent pseuowheeze   HEENT : Oropharynx  clear     NECK :  without  apparent JVD/ palpable Nodes/TM    LUNGS: no acc muscle use,  Min barrel  contour chest wall with bilateral  upper airway wheezing only  and  without cough on insp or exp maneuvers and min  Hyperresonant  to  percussion bilaterally    CV:  RRR  no s3 or murmur or increase in P2, and no edema   ABD: Obese  soft and nontender    MS:  Nl gait/ ext warm without deformities Or obvious joint restrictions  calf tenderness, cyanosis or clubbing     SKIN: warm and dry without lesions    NEURO:  alert, approp, nl sensorium with  no motor or cerebellar deficits apparent.                  Assessment   Assessment & Plan Asthmatic  bronchitis , chronic (HCC) Quit smoking 09/2020/MM - try off acei 06/30/2021  - change advair to symb 80 2bid and max rx for gerd  - alpha one  06/30/21    MM   Level  142 - Allergy profile 06/30/21  >  Eos 0.1 /  IgE  124 -Chest LDSCT   04/25/22  mild centrilobular emphysema/bronchiolitis - PFT's  11/27/22  FEV1 1.97 (82 % ) ratio 0.81  p 20 % improvement from saba p 0 prior to study with DLCO  14.06 (83%)   and FV curve mildly concave and ERV 56% at wt 195   - 08/20/2023  continue symbiort 80 with 160 for flares (hoarseness may limit ICS)  - 02/18/2024  After extensive coaching inhaler device,  effectiveness =    50% (poor insp) > rec train with empty inhaler but no change rx   >>>   08/18/2024  After extensive coaching inhaler device,  effectiveness =    75% > continue symbicort  80 as most of her wheezing is upper airway an cough is better now though concerned this is the worse time of year for allergies so given 6 d taper of pred if needed and reminded to blow symbicort  out thru the nose      Essential hypertension, benign Echo 12/09/20  There is mild left ventricular hypertrophy.  Left ventricular diastolic parameters  are indeterminate. Elevated left atrial pressure. -D/c acei 06/30/2021 due to hoarseness /cough   BP well controlled on ARB plus norvasc , no changes needed   Hoarseness Referred to ENT 03/06/2022 >>> again 11/16/2022 > again 02/19/2023  - 08/20/2023 try [facility] >>> again 02/18/2024 > 08/18/2024 [facility] arrange for appt while pt in office as problems with communication between pt and ENT           Each maintenance medication was reviewed in detail including emphasizing most importantly the difference between maintenance and prns and under what circumstances the prns are to be triggered using an action plan format where appropriate.  Total time for H and P, chart review, counseling, reviewing hfa  device(s) and generating customized AVS unique to this office visit / same day  charting = 32 min          AVS  Patient Instructions  Let us  call the ENT doctor for you and help you make the appt for you  Work on inhaler technique:  relax and gently blow all the way out then take a nice smooth full deep breath back in, triggering the inhaler at same time you start breathing in.  Hold breath in for at least  5 seconds if you can. Blow out symbicort  80  thru nose. Rinse and gargle with water  when done.  If mouth or throat bother you at all,  try brushing teeth/gums/tongue with arm and hammer toothpaste/ make a slurry and gargle and spit out.   If allergies get worse or you start needing more nebulizers >  Prednisone  10 mg take  4 each am x 2 days,   2 each am x 2 days,  1 each am x 2 days and stop   We will get you a new nebulizer   Please schedule a follow up visit in 6 months but call sooner if needed        Ozell America, MD 08/19/2024

## 2024-08-18 NOTE — Progress Notes (Deleted)
 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  Quit work 2004 at 220 lb and gained up  to 250 until husband died in 02-Oct-2020  After moving to [facility] 2012 saw [facility] doctor feeling sluggish and dx with asthma > meds didn't help much then started doe which seemed better with alb neb then added advair around 2014/15 then 02     History of Present Illness  06/30/2021  Pulmonary/ 1st office eval/ Daion Ginsberg / [place] Office  Chief Complaint  Patient presents with   Follow-up    Pt. Is on 2L O2 when sleeping and out running errands but tries not to wear it in the house unless she needs it. Coughing but not normally coughing up mucus.   Dyspnea:  foodlion 50% using 02 2lpm/  Cough: always coughing some very hoarse x years >minimally productive  Sleep: 45 degrees with pillows  SABA use: no longer needing since 02  02 :  2lpm at bedtime and prn daytime  Intol of dpi  Rec Omeprazole  40 mg Take  30-60 min before first meal of the day and Pepcid  (famotidine )  20 mg after supper until return to office - this is the best way to tell whether stomach acid is contributing to your problem.   GERD rx Stop advair and start symbicort  80 Take 2 puffs first thing in am and then another 2 puffs about 12 hours later.  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 [employer] /  mall walking slower than others = MMRC2 = can't walk a nl pace on a flat grade s sob but does fine slow and flat eg  Cough: min mucoid at hs but not does not keep her up once asleep Sleeping: 45 degrees with pillows / sleeps with cat  SABA use: up to 3 x daily  due to allergy season 02: 2lpm NP when  cat wakes her up because 02 drops  Covid status: x 3  Lung cancer screening: due  Rec I will be referring you to ENT for chronic hoarseness > not done as of 02/19/2023  We will be calling to arrange for you to start lung cancer screening  We will be scheduling PFTs  in [place] either when you go for ENT or pain management  Work on inhaler technique:   Please schedule a follow up visit in 6 months but call sooner if needed       02/19/2023  f/u ov/[place] office/Darby Shadwick re: AB GOLD 0  maint on symbicort  80   Chief Complaint  Patient presents with   Follow-up    Pt f/u states that she is feeling well other than having allergy problems  Dyspnea:  working out with treadmill 4 x weekly x 30  min at top speed 1.2 mph x incline all the way up with doe s 02 or monitoring sats as rec  Cough: with pollen/ min prod white mucus  Sleeping: flat bed pillows  SABA use: 2x per week 02: 2lpm/  prn daytime  Rec For allergies > zyrtec at bedtime will cover 24 hours  Make sure you check your oxygen  saturation  AT  your highest level of activity (not after you stop)   to be sure it stays over 90%  My office will be contacting you by phone for referral to ENT   - if you don't hear back from my office within one week please call us  back or notify us  thru MyChart and we'll address it right away.    08/20/2023  5m f/u ov/[place] office/Krystel Fletchall re: GOLD 0/AB  maint on symbiocrt 80 and at least 3 x yearly  with uri  and allergy fall  > spring with rec benadryl per [facility] allergist  Chief Complaint  Patient presents with   Hoarse  Dyspnea:  working out on 02 2lpm POC on treadmill x 30 min  Cough: ok / still  hoarse  Sleeping: flat bed/ 2 pillows s    resp cc  SABA use: none now / need solution  02: 1.5 lpm hs and prn Lung cancer screening:due q June  Rec Make sure you check your oxygen  saturation  AT  your highest level of activity (not after you stop)   to be sure it stays over 90%   Plan A = Automatic = Always=    Symbicort  80 Take 2 puffs first thing in am and then another 2 puffs about 12 hours later.  Plan B = Backup (to supplement plan A, not to replace it) Only use your albuterol  inhaler as a rescue medication Plan C = Crisis (instead of Plan B but only if Plan B stops working) - only use your albuterol  nebulizer if you first try Plan B Plan D = when start allergy or cold change to Symbicort  160 Take 2 puffs first thing in am and then another 2 puffs about 12 hours later.  My office will be contacting you by phone for referral to ENT  Please schedule a follow up visit in 6  months but call sooner if needed    02/18/2024  6 m f/u ov/[place] office/Ysabella Babiarz re: GOLD 0 / AB maint on symbicort   80 and changes to 160 for flares  which recently did with successful outcome s need for prednisone   Chief Complaint  Patient presents with   Asthma  Dyspnea:  yard work off 02  Cough: none  Sleeping: flat bed/ 2 pillows s    resp cc  SABA use: rarely using prn  02: bedtime x 1.5 lpm dry nose  Lung cancer screening: q June  Rec My office will be contacting you by phone for referral to [facility] ENT   Work on inhaler technique:     08/18/2024  f/u ov/[place] office/Azadeh Hyder re: GOLD 0 / AB maint on ***  No chief complaint on file.   Dyspnea:  *** Cough: *** Sleeping: ***   resp cc  SABA use: *** 02: ***  Lung cancer screening: ***   No obvious day to day or daytime variability or assoc excess/ purulent sputum or mucus plugs or hemoptysis or cp or chest tightness, subjective wheeze or overt sinus or hb symptoms.    Also denies any obvious fluctuation of symptoms with weather or environmental  changes or other aggravating or alleviating factors except as outlined above   No unusual exposure hx or h/o childhood pna/ asthma or knowledge of premature birth.  Current Allergies, Complete Past Medical History, Past Surgical History, Family History, and Social History were reviewed in [employer] record.  ROS  The following are not active complaints unless bolded Hoarseness, sore throat, dysphagia, dental problems, itching, sneezing,  nasal congestion or discharge of excess mucus or purulent secretions, ear ache,   fever, chills, sweats, unintended wt loss or wt gain, classically pleuritic or exertional cp,  orthopnea pnd or arm/hand swelling  or leg swelling, presyncope, palpitations, abdominal pain, anorexia, nausea, vomiting, diarrhea  or change in bowel habits or change in bladder habits, change in stools or change in urine, dysuria, hematuria,  rash, arthralgias, visual complaints, headache, numbness, weakness or ataxia or problems with walking or coordination,  change in mood or  memory.        No outpatient medications have been marked as taking for the 08/18/24 encounter (Appointment) with Darlean Ozell NOVAK, MD.              Past Medical History:  Diagnosis Date   Asthma    COPD (chronic obstructive pulmonary disease) (HCC)    Hyperlipidemia    Hypertension    Type 2 diabetes mellitus (HCC)         Objective:    Wts  02/18/2024        203   08/20/2023     199  02/19/2023       206 11/16/2022       200  03/06/2022         206   08/30/21 212 lb (96.2 kg)  07/11/21 209 lb 6.4 oz (95 kg)  06/30/21 210 lb 1.6 oz (95.3 kg)    Vital signs reviewed  02/18/2024  - Note at rest 02 sats  94% on RA     Min barrel ***              Assessment

## 2024-08-18 NOTE — Patient Instructions (Signed)
 Let us  call the ENT doctor for you and help you make the appt for you  Work on inhaler technique:  relax and gently blow all the way out then take a nice smooth full deep breath back in, triggering the inhaler at same time you start breathing in.  Hold breath in for at least  5 seconds if you can. Blow out symbicort  80  thru nose. Rinse and gargle with water  when done.  If mouth or throat bother you at all,  try brushing teeth/gums/tongue with arm and hammer toothpaste/ make a slurry and gargle and spit out.   If allergies get worse or you start needing more nebulizers >  Prednisone  10 mg take  4 each am x 2 days,   2 each am x 2 days,  1 each am x 2 days and stop   We will get you a new nebulizer   Please schedule a follow up visit in 6 months but call sooner if needed

## 2024-08-19 DIAGNOSIS — E1165 Type 2 diabetes mellitus with hyperglycemia: Secondary | ICD-10-CM | POA: Diagnosis not present

## 2024-08-19 DIAGNOSIS — J4521 Mild intermittent asthma with (acute) exacerbation: Secondary | ICD-10-CM | POA: Diagnosis not present

## 2024-08-19 NOTE — Assessment & Plan Note (Addendum)
 Referred to ENT 03/06/2022 >>> again 11/16/2022 > again 02/19/2023  - 08/20/2023 try [facility] >>> again 02/18/2024 > 08/18/2024 [facility] arrange for appt while pt in office as problems with communication between pt and ENT           Each maintenance medication was reviewed in detail including emphasizing most importantly the difference between maintenance and prns and under what circumstances the prns are to be triggered using an action plan format where appropriate.  Total time for H and P, chart review, counseling, reviewing hfa  device(s) and generating customized AVS unique to this office visit / same day charting = 32 min

## 2024-08-19 NOTE — Assessment & Plan Note (Addendum)
 Quit smoking 09/2020/MM - try off acei 06/30/2021  - change advair to symb 80 2bid and max rx for gerd  - alpha one  06/30/21    MM   Level  142 - Allergy profile 06/30/21  >  Eos 0.1 /  IgE  124 -Chest LDSCT   04/25/22  mild centrilobular emphysema/bronchiolitis - PFT's  11/27/22  FEV1 1.97 (82 % ) ratio 0.81  p 20 % improvement from saba p 0 prior to study with DLCO  14.06 (83%)   and FV curve mildly concave and ERV 56% at wt 195   - 08/20/2023  continue symbiort 80 with 160 for flares (hoarseness may limit ICS)  - 02/18/2024  After extensive coaching inhaler device,  effectiveness =    50% (poor insp) > rec train with empty inhaler but no change rx   >>>   08/18/2024  After extensive coaching inhaler device,  effectiveness =    75% > continue symbicort  80 as most of her wheezing is upper airway an cough is better now though concerned this is the worse time of year for allergies so given 6 d taper of pred if needed and reminded to blow symbicort  out thru the nose

## 2024-08-19 NOTE — Assessment & Plan Note (Addendum)
 Echo 12/09/20  There is mild left ventricular hypertrophy.  Left ventricular diastolic parameters  are indeterminate. Elevated left atrial pressure. -D/c acei 06/30/2021 due to hoarseness /cough   BP well controlled on ARB plus norvasc , no changes needed

## 2024-09-04 ENCOUNTER — Institutional Professional Consult (permissible substitution) (INDEPENDENT_AMBULATORY_CARE_PROVIDER_SITE_OTHER)

## 2024-09-04 ENCOUNTER — Telehealth: Payer: Self-pay | Admitting: Internal Medicine

## 2024-09-04 NOTE — Telephone Encounter (Signed)
 Called patient left voicemail to call our office to schedule an in office visit with Dr Tobie.

## 2024-09-07 ENCOUNTER — Other Ambulatory Visit: Payer: Self-pay | Admitting: Internal Medicine

## 2024-09-07 DIAGNOSIS — M5431 Sciatica, right side: Secondary | ICD-10-CM

## 2024-09-14 NOTE — Progress Notes (Unsigned)
 Cardiology Office Note    Date:  09/16/2024  ID:  Meleny Tregoning, DOB [birth year], MRN [id number] Cardiologist: Jayson Sierras, MD Cardiology APP:  Johnson Laymon HERO, PA-C { :  History of Present Illness:    Christine Weaver is a 58 y.o. female with past medical history of HTN, HLD, Type 2 DM and COPD who presents to the office today for annual follow-up.   She was last examined by Dr. Sierras in 08/2023 and reported NYHA class II dyspnea but no chest pain or palpitations. Was using supplemental O2 as needed per Pulmonology. No changes were made to her cardiac medications and she was continued on Jardiance  10mg  daily, Kerendia 10mg  daily, Lasix  20mg  daily and Irbesartan  75mg  daily.   In talking with the patient today, she reports overall doing well since her last office visit. She has baseline dyspnea on exertion but no acute changes in this. Rarely utilizes supplemental oxygen . No specific orthopnea, PND or pitting edema. She denies any recent exertional chest pain or palpitations. Over the past few weeks, she has been bottlefeeding 3 kittens that she found in a box at [place].  Studies Reviewed:   EKG: EKG is ordered today and demonstrates:   EKG Interpretation Date/Time:  Tuesday September 16 2024 10:38:38 EST Ventricular Rate:  82 PR Interval:  194 QRS Duration:  82 QT Interval:  362 QTC Calculation: 422 R Axis:   30  Text Interpretation: Normal sinus rhythm No acute ST changes. Confirmed by Johnson Laymon ([id number]) on 09/16/2024 10:50:43 AM       Echocardiogram: 11/2021 IMPRESSIONS     1. Left ventricular ejection fraction, by estimation, is 60 to 65%. The  left ventricle has normal function. The left ventricle has no regional  wall motion abnormalities. There is mild left ventricular hypertrophy.  Left ventricular diastolic parameters  are consistent with Grade I diastolic dysfunction (impaired relaxation).   2. Right ventricular systolic function is normal. The  right ventricular  size is normal.   3. Left atrial size was mildly dilated.   4. The mitral valve is normal in structure. No evidence of mitral valve  regurgitation. No evidence of mitral stenosis.   5. The aortic valve is normal in structure. Aortic valve regurgitation is  not visualized. No aortic stenosis is present.   6. The inferior vena cava is normal in size with greater than 50%  respiratory variability, suggesting right atrial pressure of 3 mmHg.   Comparison(s): No significant change from prior study. Prior images  reviewed side by side.    Physical Exam:   VS:  BP 122/78   Pulse 82   Ht 5' (1.524 m)   Wt 203 lb (92.1 kg)   SpO2 96%   BMI 39.65 kg/m    Wt Readings from Last 3 Encounters:  09/16/24 203 lb (92.1 kg)  08/18/24 202 lb 12.8 oz (92 kg)  07/30/24 203 lb (92.1 kg)     GEN: Well nourished, well developed female appearing in no acute distress NECK: No JVD; No carotid bruits CARDIAC: RRR, no murmurs, rubs, gallops RESPIRATORY:  Clear to auscultation without rales, wheezing or rhonchi  ABDOMEN: Appears non-distended. No obvious abdominal masses. EXTREMITIES: No clubbing or cyanosis. No pitting edema.  Distal pedal pulses are 2+ bilaterally.   Assessment and Plan:   1. Chronic heart failure with preserved ejection fraction (HCC) - Echocardiogram in 11/2021 showed a preserved EF of 60 to 65% with mild LVH and grade 1 diastolic dysfunction. Volume status has overall  been stable and she appears euvolemic by examination today. Followed by Nephrology as well given her CKD. - Continue current medical therapy for now with Farxiga  10 mg daily, Lasix  20 mg daily and Kerendia 10 mg daily.  2. Essential hypertension - Blood pressure was initially elevated at 135/89, rechecked and improved to 122/78. Continue current medical therapy with Amlodipine  10 mg daily and Telmisartan 20 mg daily. She is listed as taking Irbesartan  as well but I confirmed with the patient that  she is no longer on this medication.  3. Hyperlipidemia LDL goal <70 - Followed by her PCP.  FLP in 04/2024 showed total cholesterol 168, triglycerides 331, HDL 33 and LDL 81. She does not believe she was fasting at that time. For now, continue current medical therapy with Atorvastatin  80 mg daily.  4. CKD stage 3a, GFR 45-59 ml/min (HCC) - Followed by Dr. Rachele. Creatinine was at 1.51 when checked in 04/2024. Remains on Farxiga  10 mg daily, Lasix  20 mg daily and Kerendia 10 mg daily.   Signed, Laymon CHRISTELLA Qua, PA-C

## 2024-09-16 ENCOUNTER — Encounter: Payer: Self-pay | Admitting: Student

## 2024-09-16 ENCOUNTER — Ambulatory Visit: Attending: Student | Admitting: Student

## 2024-09-16 VITALS — BP 122/78 | HR 82 | Ht 60.0 in | Wt 203.0 lb

## 2024-09-16 DIAGNOSIS — E785 Hyperlipidemia, unspecified: Secondary | ICD-10-CM | POA: Diagnosis not present

## 2024-09-16 DIAGNOSIS — I10 Essential (primary) hypertension: Secondary | ICD-10-CM

## 2024-09-16 DIAGNOSIS — I5032 Chronic diastolic (congestive) heart failure: Secondary | ICD-10-CM | POA: Diagnosis not present

## 2024-09-16 DIAGNOSIS — N1831 Chronic kidney disease, stage 3a: Secondary | ICD-10-CM

## 2024-09-16 NOTE — Patient Instructions (Signed)
 Medication Instructions:  Your physician recommends that you continue on your current medications as directed. Please refer to the Current Medication list given to you today.  *If you need a refill on your cardiac medications before your next appointment, please call your pharmacy*  Lab Work: None today If you have labs (blood work) drawn today and your tests are completely normal, you will receive your results only by: MyChart Message (if you have MyChart) OR A paper copy in the mail If you have any lab test that is abnormal or we need to change your treatment, we will call you to review the results.  Testing/Procedures: None today  Follow-Up: At [facility], you and your health needs are our priority.  As part of our continuing mission to provide you with exceptional heart care, our providers are all part of one team.  This team includes your primary Cardiologist (physician) and Advanced Practice Providers or APPs (Physician Assistants and Nurse Practitioners) who all work together to provide you with the care you need, when you need it.  Your next appointment:   1 year(s)  Provider:   You may see one of the following Advanced Practice Providers on your designated Care Team:   Brittany Strader, PA-C  [place], [place] Olivia [place], [place]     We recommend signing up for the patient portal called MyChart.  Sign up information is provided on this After Visit Summary.  MyChart is used to connect with patients for Virtual Visits (Telemedicine).  Patients are able to view lab/test results, encounter notes, upcoming appointments, etc.  Non-urgent messages can be sent to your provider as well.   To learn more about what you can do with MyChart, go to [email or web address].   Other Instructions None

## 2024-09-18 ENCOUNTER — Other Ambulatory Visit: Payer: Self-pay | Admitting: Internal Medicine

## 2024-09-19 ENCOUNTER — Ambulatory Visit (INDEPENDENT_AMBULATORY_CARE_PROVIDER_SITE_OTHER)

## 2024-09-19 VITALS — BP 123/76 | HR 90 | Temp 98.0°F | Ht 60.0 in | Wt 205.0 lb

## 2024-09-19 DIAGNOSIS — R490 Dysphonia: Secondary | ICD-10-CM

## 2024-09-19 DIAGNOSIS — J381 Polyp of vocal cord and larynx: Secondary | ICD-10-CM | POA: Diagnosis not present

## 2024-09-19 DIAGNOSIS — J383 Other diseases of vocal cords: Secondary | ICD-10-CM

## 2024-09-19 DIAGNOSIS — H9192 Unspecified hearing loss, left ear: Secondary | ICD-10-CM

## 2024-09-19 DIAGNOSIS — H608X3 Other otitis externa, bilateral: Secondary | ICD-10-CM

## 2024-09-19 MED ORDER — FLUOCINOLONE ACETONIDE 0.01 % OT OIL: 3.0000 [drp] | TOPICAL_OIL | Freq: Two times a day (BID) | OTIC | 0 refills | Status: AC
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
## 2024-09-28 ENCOUNTER — Other Ambulatory Visit: Payer: Self-pay | Admitting: "Endocrinology

## 2024-10-07 ENCOUNTER — Ambulatory Visit: Payer: Self-pay | Admitting: Internal Medicine

## 2024-10-18 ENCOUNTER — Other Ambulatory Visit: Payer: Self-pay | Admitting: Internal Medicine

## 2024-10-18 DIAGNOSIS — M5431 Sciatica, right side: Secondary | ICD-10-CM

## 2024-10-29 ENCOUNTER — Encounter: Payer: Self-pay | Admitting: "Endocrinology

## 2024-10-29 ENCOUNTER — Ambulatory Visit (INDEPENDENT_AMBULATORY_CARE_PROVIDER_SITE_OTHER): Admitting: "Endocrinology

## 2024-10-29 VITALS — BP 142/81 | HR 86 | Resp 18 | Ht 60.0 in | Wt 207.2 lb

## 2024-10-29 DIAGNOSIS — E119 Type 2 diabetes mellitus without complications: Secondary | ICD-10-CM | POA: Diagnosis not present

## 2024-10-29 DIAGNOSIS — E66812 Obesity, class 2: Secondary | ICD-10-CM

## 2024-10-29 DIAGNOSIS — Z6839 Body mass index (BMI) 39.0-39.9, adult: Secondary | ICD-10-CM | POA: Diagnosis not present

## 2024-10-29 DIAGNOSIS — I10 Essential (primary) hypertension: Secondary | ICD-10-CM | POA: Diagnosis not present

## 2024-10-29 DIAGNOSIS — E782 Mixed hyperlipidemia: Secondary | ICD-10-CM | POA: Diagnosis not present

## 2024-10-29 DIAGNOSIS — Z794 Long term (current) use of insulin: Secondary | ICD-10-CM

## 2024-10-29 LAB — POCT GLYCOSYLATED HEMOGLOBIN (HGB A1C)

## 2024-10-29 MED ORDER — TIRZEPATIDE 10 MG/0.5ML ~~LOC~~ SOAJ: 10.0000 mg | SUBCUTANEOUS | 1 refills | Status: AC

## 2024-10-29 MED ORDER — FREESTYLE LIBRE 2 PLUS SENSOR MISC: 3 refills | Status: AC

## 2024-10-29 NOTE — Patient Instructions (Signed)

## 2024-10-29 NOTE — Progress Notes (Signed)
 "                          10/29/2024, 4:24 PM  Endocrinology follow-up note   Subjective:    Patient ID: Christine Weaver, female    DOB: 1965/12/19.  Christine Weaver is being seen in follow-up after she was seen in consultation for management of currently uncontrolled symptomatic diabetes requested by  Tobie Suzzane POUR, MD.   Past Medical History:  Diagnosis Date   Asthma    Bell's palsy    COPD (chronic obstructive pulmonary disease) (HCC)    Hyperlipidemia    Hypertension    Lyme disease    Type 2 diabetes mellitus (HCC)     Past Surgical History:  Procedure Laterality Date   CESAREAN SECTION     COLONOSCOPY WITH PROPOFOL  N/A 12/16/2021   Procedure: COLONOSCOPY WITH PROPOFOL ;  Surgeon: Eartha Angelia Sieving, MD;  Location: [facility];  Service: Gastroenterology;  Laterality: N/A;  945   HOT HEMOSTASIS  12/16/2021   Procedure: HOT HEMOSTASIS (ARGON PLASMA COAGULATION/BICAP);  Surgeon: Eartha Angelia, Sieving, MD;  Location: [facility];  Service: Gastroenterology;;   POLYPECTOMY  12/16/2021   Procedure: POLYPECTOMY;  Surgeon: Eartha Angelia Sieving, MD;  Location: [facility];  Service: Gastroenterology;;   RENAL BIOPSY  06/02/2022   SUBMUCOSAL TATTOO INJECTION  12/16/2021   Procedure: SUBMUCOSAL TATTOO INJECTION;  Surgeon: Eartha Angelia Sieving, MD;  Location: [facility];  Service: Gastroenterology;;    Social History   Socioeconomic History   Marital status: Widowed    Spouse name: Not on file   Number of children: Not on file   Years of education: Not on file   Highest education level: 12th grade  Occupational History   Not on file  Tobacco Use   Smoking status: Former    Current packs/day: 0.00    Average packs/day: 0.5 packs/day for 33.0 years (16.5 ttl pk-yrs)    Types: Cigarettes    Start date: 70    Quit date: 10/29/2020    Years since quitting: 4.0   Smokeless tobacco: Never  Vaping Use   Vaping status: Never Used  Substance and  Sexual Activity   Alcohol use: Never   Drug use: Never   Sexual activity: Not on file  Other Topics Concern   Not on file  Social History Narrative   Not on file   Social Drivers of Health   Tobacco Use: Medium Risk (10/29/2024)   Patient History    Smoking Tobacco Use: Former    Smokeless Tobacco Use: Never    Passive Exposure: Not on Actuary Strain: Low Risk (07/30/2024)   Overall Financial Resource Strain (CARDIA)    Difficulty of Paying Living Expenses: Not hard at all  Food Insecurity: No Food Insecurity (07/30/2024)   Epic    Worried About Programme Researcher, Broadcasting/film/video in the Last Year: Never true    Ran Out of Food in the Last Year: Never true  Transportation Needs: No Transportation Needs (07/30/2024)   Epic    Lack of Transportation (Medical): No    Lack of Transportation (Non-Medical): No  Physical Activity: Sufficiently Active (07/30/2024)   Exercise Vital Sign    Days of Exercise per Week: 7 days    Minutes of Exercise per Session: 30 min  Stress: No Stress Concern Present (07/30/2024)   [employer] of Occupational Health - Occupational Stress Questionnaire    Feeling of Stress: Not  at all  Social Connections: Patient Declined (07/30/2024)   Social Connection and Isolation Panel    Frequency of Communication with Friends and Family: Patient declined    Frequency of Social Gatherings with Friends and Family: Patient declined    Attends Religious Services: Patient declined    Active Member of Clubs or Organizations: Patient declined    Attends Banker Meetings: Patient declined    Marital Status: Patient declined  Depression (PHQ2-9): Low Risk (07/30/2024)   Depression (PHQ2-9)    PHQ-2 Score: 0  Alcohol Screen: Low Risk (07/30/2024)   Alcohol Screen    Last Alcohol Screening Score (AUDIT): 0  Housing: Low Risk (07/30/2024)   Epic    Unable to Pay for Housing in the Last Year: No    Number of Times Moved in the Last Year: 0    Homeless in  the Last Year: No  Utilities: Not At Risk (07/30/2024)   Epic    Threatened with loss of utilities: No  Health Literacy: Adequate Health Literacy (07/30/2024)   B1300 Health Literacy    Frequency of need for help with medical instructions: Never    Family History  Problem Relation Age of Onset   Thyroid  disease Mother    Hypertension Mother    Heart attack Mother    Stroke Mother    Heart failure Mother    Diabetes Mother    Emphysema Mother    Cancer Mother    Diabetes Father    Heart attack Father    Stroke Father    Kidney disease Father    Heart failure Father     Outpatient Encounter Medications as of 10/29/2024  Medication Sig   albuterol  (PROVENTIL ) (2.5 MG/3ML) 0.083% nebulizer solution TAKE 3 ML (2.5 MG TOTAL) BY NEBULIZATION EVERY 4 HOURS AS NEEDED FOR WHEEZING OR SHORTNESS OF BREATH   albuterol  (VENTOLIN  HFA) 108 (90 Base) MCG/ACT inhaler Inhale 2 puffs into the lungs every 4 (four) hours as needed for wheezing or shortness of breath.   amLODipine  (NORVASC ) 10 MG tablet TAKE 1 TABLET BY MOUTH EVERY DAY   atorvastatin  (LIPITOR) 80 MG tablet TAKE 1 TABLET BY MOUTH EVERY DAY   BOOSTRIX  5-2.5-18.5 LF-MCG/0.5 injection    calcitRIOL (ROCALTROL) 0.25 MCG capsule Take 0.25 mcg by mouth 3 (three) times a week.   Continuous Blood Gluc Receiver (FREESTYLE LIBRE 2 READER) DEVI As directed   cyclobenzaprine  (FLEXERIL ) 5 MG tablet TAKE 1 TABLET BY MOUTH 2 TIMES DAILY AS NEEDED FOR MUSCLE SPASMS.   EMBECTA PEN NEEDLE NANO 2 GEN 32G X 4 MM MISC USE 1 PEN NEEDLE FOR 6 INJECTIONS DAILY FOR DIABETES E11.9   FARXIGA  10 MG TABS tablet TAKE 1 TABLET BY MOUTH DAILY BEFORE BREAKFAST.   fluticasone (FLONASE) 50 MCG/ACT nasal spray Place 1 spray into both nostrils daily as needed for allergies.   furosemide  (LASIX ) 20 MG tablet TAKE 1 TABLET BY MOUTH EVERY DAY   glucose blood (ACCU-CHEK AVIVA PLUS) test strip    HYDROcodone -acetaminophen  (NORCO/VICODIN) 5-325 MG tablet Take 1 tablet by mouth  every 12 (twelve) hours as needed for severe pain.   insulin  glargine (LANTUS ) 100 UNIT/ML Solostar Pen Inject 70 Units into the skin at bedtime.   insulin  lispro (HUMALOG ) 100 UNIT/ML KwikPen INJECT 10-16 UNITS INTO THE SKIN 3 TIMES DAILY WITH MEALS   KERENDIA 10 MG TABS Take 1 tablet by mouth daily.   omeprazole  (PRILOSEC) 40 MG capsule Take 1 capsule (40 mg total) by mouth daily.  telmisartan (MICARDIS) 40 MG tablet Take 40 mg by mouth daily.   VITAMIN D  PO Take 1 capsule by mouth daily.   [DISCONTINUED] Continuous Glucose Sensor (FREESTYLE LIBRE 2 PLUS SENSOR) MISC Change sensor every 15 days.   [DISCONTINUED] tirzepatide  (MOUNJARO ) 10 MG/0.5ML Pen Inject 10 mg into the skin once a week.   budesonide -formoterol  (SYMBICORT ) 160-4.5 MCG/ACT inhaler In event of flares:  take 2 puffs first thing in am and then another 2 puffs about 12 hours later. (Patient not taking: Reported on 10/29/2024)   budesonide -formoterol  (SYMBICORT ) 80-4.5 MCG/ACT inhaler TAKE 2 PUFFS FIRST THING IN AM AND THEN ANOTHER 2 PUFFS ABOUT 12 HOURS LATER (Patient not taking: Reported on 10/29/2024)   colchicine  0.6 MG tablet Take 1 tablet (0.6 mg total) by mouth 2 (two) times daily. (Patient not taking: Reported on 10/29/2024)   Continuous Glucose Sensor (FREESTYLE LIBRE 2 PLUS SENSOR) MISC Change sensor every 15 days.   famotidine  (PEPCID ) 20 MG tablet TAKE 1 TABLET AFTER SUPPER (Patient not taking: Reported on 10/29/2024)   Ferrous Sulfate (IRON PO) Take 1 tablet by mouth daily. (Patient not taking: Reported on 10/29/2024)   naloxone (NARCAN) nasal spray 4 mg/0.1 mL Place 1 spray into the nose once. (Patient not taking: Reported on 10/29/2024)   predniSONE  (DELTASONE ) 10 MG tablet Take  4 each am x 2 days,   2 each am x 2 days,  1 each am x 2 days and stop (Patient not taking: Reported on 10/29/2024)   tirzepatide  (MOUNJARO ) 10 MG/0.5ML Pen Inject 10 mg into the skin once a week.   No facility-administered encounter  medications on file as of 10/29/2024.    ALLERGIES: Allergies  Allergen Reactions   Morphine  And Codeine Itching   Other Other (See Comments)    Bells Palsy    Oxycodone-Acetaminophen  Other (See Comments)    Constipates patient    Latex Itching and Other (See Comments)    Skin cracks open     VACCINATION STATUS: Immunization History  Administered Date(s) Administered   Moderna SARS-COV2 Booster Vaccination 09/03/2021   Moderna Sars-Covid-2 Vaccination 12/11/2019, 11/13/2020   PNEUMOCOCCAL CONJUGATE-20 10/06/2021   Tdap 03/13/2018   Zoster Recombinant(Shingrix) 10/06/2021, 02/06/2022    Diabetes She presents for her follow-up diabetic visit. She has type 2 diabetes mellitus. Onset time: She was diagnosed at approximately age of 42 years. Her disease course has been fluctuating. There are no hypoglycemic associated symptoms. Pertinent negatives for hypoglycemia include no confusion, headaches, pallor or seizures. Pertinent negatives for diabetes include no chest pain, no fatigue, no polydipsia, no polyphagia and no polyuria. There are no hypoglycemic complications. Symptoms are worsening. Risk factors for coronary artery disease include dyslipidemia, diabetes mellitus, hypertension, obesity, sedentary lifestyle and tobacco exposure. Current diabetic treatment includes insulin  injections. Her weight is fluctuating minimally. She is following a generally unhealthy diet. When asked about meal planning, she reported none. She has not had a previous visit with a dietitian. She never participates in exercise. Her home blood glucose trend is fluctuating minimally. Her breakfast blood glucose range is generally 140-180 mg/dl. Her lunch blood glucose range is generally 140-180 mg/dl. Her dinner blood glucose range is generally 140-180 mg/dl. Her bedtime blood glucose range is generally 140-180 mg/dl. Her overall blood glucose range is 140-180 mg/dl. (Christine Weaver presents with her CGM device showing  average blood glucose of 151 her most recent 14 days, CGM device showing 72% time in range, 23% level 1 hyperglycemia, 3% level 2 hyperglycemia.  She has no hypoglycemia.  She has tolerated her Mounjaro , basal/bolus insulin  as well as Farxiga .  Her point-of-care A1c is 8.1% increasing from 7.6% during her last visit. ) An ACE inhibitor/angiotensin II receptor blocker is being taken. Eye exam is current.  Hyperlipidemia This is a chronic problem. The problem is uncontrolled. Recent lipid tests were reviewed and are high. Exacerbating diseases include diabetes and obesity. Pertinent negatives include no chest pain, myalgias or shortness of breath. Current antihyperlipidemic treatment includes statins. Risk factors for coronary artery disease include dyslipidemia, diabetes mellitus, family history, hypertension, obesity and a sedentary lifestyle.  Hypertension This is a chronic problem. The current episode started more than 1 year ago. The problem is uncontrolled. Pertinent negatives include no chest pain, headaches, palpitations or shortness of breath. Risk factors for coronary artery disease include diabetes mellitus, dyslipidemia, obesity, sedentary lifestyle, smoking/tobacco exposure and family history. Past treatments include ACE inhibitors.   Review of systems: Limited as above.    Objective:       10/29/2024    9:25 AM 09/19/2024    9:34 AM 09/16/2024   11:05 AM  Vitals with BMI  Height 5' 0 5' 0   Weight 207 lbs 3 oz 205 lbs   BMI 40.47 40.04   Systolic 142 123 877  Diastolic 81 76 78  Pulse 86 90     BP (!) 142/81   Pulse 86   Resp 18   Ht 5' (1.524 m)   Wt 207 lb 3.2 oz (94 kg)   SpO2 98%   BMI 40.47 kg/m   Wt Readings from Last 3 Encounters:  10/29/24 207 lb 3.2 oz (94 kg)  09/19/24 205 lb (93 kg)  09/16/24 203 lb (92.1 kg)     CMP ( most recent) CMP     Component Value Date/Time   NA 141 05/06/2024 0833   K 5.1 05/06/2024 0833   CL 102 05/06/2024 0833   CO2  23 05/06/2024 0833   GLUCOSE 80 05/06/2024 0833   GLUCOSE 184 (H) 12/14/2021 0901   BUN 20 05/06/2024 0833   CREATININE 1.51 (H) 05/06/2024 0833   CALCIUM  10.3 (H) 05/06/2024 0833   PROT 6.6 05/06/2024 0833   ALBUMIN 3.9 05/06/2024 0833   AST 16 05/06/2024 0833   ALT 16 05/06/2024 0833   ALKPHOS 144 (H) 05/06/2024 0833   BILITOT 0.3 05/06/2024 0833   GFRNONAA >60 12/14/2021 0901   GFRAA 80 11/11/2020 0817    Diabetic Labs (most recent): Lab Results  Component Value Date   HGBA1C 7.6 (A) 07/28/2024   HGBA1C 7.4 (A) 03/19/2024   HGBA1C 8.4 (A) 11/19/2023   MICROALBUR 150 03/19/2024     Lipid Panel ( most recent) Lipid Panel     Component Value Date/Time   CHOL 168 05/06/2024 0833   TRIG 331 (H) 05/06/2024 0833   HDL 33 (L) 05/06/2024 0833   CHOLHDL 5.1 (H) 05/06/2024 0833   LDLCALC 81 05/06/2024 0833   LABVLDL 54 (H) 05/06/2024 0833      Lab Results  Component Value Date   TSH 1.940 11/14/2023   TSH 3.110 04/06/2022   TSH 1.210 10/13/2021   TSH 1.730 01/27/2021   TSH 2.640 11/11/2020   TSH 1.42 09/23/2019   FREET4 1.13 11/14/2023   FREET4 1.25 04/06/2022   FREET4 1.25 01/27/2021   FREET4 1.28 11/11/2020      Assessment & Plan:   1. Type 2 diabetes mellitus with hyperglycemia, with long-term current use of insulin  (HCC)  - Christine Weaver has  currently uncontrolled symptomatic type 2 DM since  58 years of age.  Christine Weaver presents with her CGM device showing average blood glucose of 151 her most recent 14 days, CGM device showing 72% time in range, 23% level 1 hyperglycemia, 3% level 2 hyperglycemia.  She has no hypoglycemia.  She has tolerated her Mounjaro , basal/bolus insulin  as well as Farxiga .  Her point-of-care A1c is 8.1% increasing from 7.6% during her last visit.   Recent labs reviewed.  - I had a long discussion with her about the progressive nature of diabetes and the pathology behind its complications. -her diabetes is complicated by  obesity/sedentary life, smoking and she remains at a high risk for more acute and chronic complications which include CAD, CVA, CKD, retinopathy, and neuropathy. These are all discussed in detail with her.   -  she is advised to stick to a routine mealtimes to eat 3 meals  a day and avoid unnecessary snacks ( to snack only to correct hypoglycemia).  - she acknowledges that there is a room for improvement in her food and drink choices. - Suggestion is made for her to avoid simple carbohydrates  from her diet including Cakes, Sweet Desserts, Ice Cream, Soda (diet and regular), Sweet Tea, Candies, Chips, Cookies, Store Bought Juices, Alcohol in Excess of  1-2 drinks a day, Artificial Sweeteners,  Coffee Creamer, and Sugar-free Products, Lemonade. This will help patient to have more stable blood glucose profile and potentially avoid unintended weight gain.    - I have approached her with the following individualized plan to manage  her diabetes and patient agrees:    She will continue to need multiple modality treatments in order for her to maintain control of diabetes to target.    -She presents with above target glycemic profile.  Accordingly, she is advised to continue continue Lantus  70 units nightly, continue Humalog  10-16 units 3 times daily AC for Premeal blood glucose readings above 90 mg per DL.   -She is advised to continue to use her CGM continuously, freestyle libre 2+ sensor is prescribed for her. - she is warned not to take insulin  without proper monitoring per orders.  - she is encouraged to call clinic for blood glucose levels less than 70 or above 200 mg /dl. - she presents with significant proteinuria.  She is advised to discontinue metformin  for now.  - She is advised to continue Farxiga  10 mg p.o. nightly.    She is tolerating her current dose of Mounjaro  at 10 mg subcutaneously weekly.  She will be considered for higher doses on subsequent visits.  Side effects and precautions  discussed with her.  - Specific targets for  A1c;  LDL, HDL,  and Triglycerides were discussed with the patient.  2) Blood Pressure /Hypertension:   Her blood pressure is controlled.  Finerenone 10 mg p.o. daily was prescribed by her nephrologist, advised to continue in light of the fact that she has significant proteinuria.   she is advised to continue her current medications including lisinopril  20 mg p.o. daily with breakfast .   3) Lipids/Hyperlipidemia:   Review of her recent lipid panel showed improved lipid panel with LDL at 81.  She is advised to continue atorvastatin  80 mg p.o. nightly.    She will have fasting lipid panel before next visit.   Whole food plant-based diet was discussed and recommended to her.     4)  Weight/Diet: Her BMI is 40.47 kg/m-- clearly complicating her diabetes care.  she is  a candidate for weight loss. I discussed with her the fact that loss of 5 - 10% of her  current body weight will have the most impact on her diabetes management.  Exercise, and detailed carbohydrates information provided  -  detailed on discharge instructions.  5) Chronic Care/Health Maintenance:  -she  is on ACEI/ARB and Statin medications and  is encouraged to initiate and continue to follow up with Ophthalmology, Dentist,  Podiatrist at least yearly or according to recommendations, and advised to  quit smoking. I have recommended yearly flu vaccine and pneumonia vaccine at least every 5 years; moderate intensity exercise for up to 150 minutes weekly; and  sleep for at least 7 hours a day.  Her recent screening ABI was negative for PAD in February 2022.  Her next study will be due in February 2027 or sooner if needed.  Her diabetes foot exam was normal except for bilateral calluses July 28, 2024 - she is  advised to maintain close follow up with Tobie Suzzane POUR, MD for primary care needs, as well as her other providers for optimal and coordinated care.  I spent  40  minutes in  the care of the patient today including review of labs from CMP, Lipids, Thyroid  Function, Hematology (current and previous including abstractions from other facilities); face-to-face time discussing  her blood glucose readings/logs, discussing hypoglycemia and hyperglycemia episodes and symptoms, medications doses, her options of short and long term treatment based on the latest standards of care / guidelines;  discussion about incorporating lifestyle medicine;  and documenting the encounter. Risk reduction counseling performed per USPSTF guidelines to reduce  obesity and cardiovascular risk factors.     Please refer to Patient Instructions for Blood Glucose Monitoring and Insulin /Medications Dosing Guide  in media tab for additional information. Please  also refer to  Patient Self Inventory in the Media  tab for reviewed elements of pertinent patient history.  Christine Weaver participated in the discussions, expressed understanding, and voiced agreement with the above plans.  All questions were answered to her satisfaction. she is encouraged to contact clinic should she have any questions or concerns prior to her return visit.      Follow up plan: - Return in about 4 months (around 02/26/2025) for F/U with Pre-visit Labs, Meter/CGM/Logs, A1c here.  Ranny Earl, MD [facility] [address] [place], [place] [address] Phone: [phone]  Fax: [phone]    10/29/2024, 4:24 PM  This note was partially dictated with voice recognition software. Similar sounding words can be transcribed inadequately or may not  be corrected upon review.  "

## 2024-11-03 ENCOUNTER — Ambulatory Visit (INDEPENDENT_AMBULATORY_CARE_PROVIDER_SITE_OTHER)

## 2024-11-09 ENCOUNTER — Other Ambulatory Visit: Payer: Self-pay | Admitting: Internal Medicine

## 2024-11-09 DIAGNOSIS — K219 Gastro-esophageal reflux disease without esophagitis: Secondary | ICD-10-CM

## 2024-11-09 DIAGNOSIS — E7849 Other hyperlipidemia: Secondary | ICD-10-CM

## 2024-11-12 ENCOUNTER — Ambulatory Visit (INDEPENDENT_AMBULATORY_CARE_PROVIDER_SITE_OTHER)

## 2024-11-14 ENCOUNTER — Ambulatory Visit (HOSPITAL_COMMUNITY)

## 2024-11-17 ENCOUNTER — Telehealth: Payer: Self-pay | Admitting: "Endocrinology

## 2024-11-17 NOTE — Telephone Encounter (Signed)
 Patient left a VM stating that her Pain Doctor wants her mounjaro  shot changed because they state it shows alcohol in her system.

## 2024-11-18 ENCOUNTER — Ambulatory Visit: Admitting: Internal Medicine

## 2024-11-20 ENCOUNTER — Encounter (INDEPENDENT_AMBULATORY_CARE_PROVIDER_SITE_OTHER): Payer: Self-pay | Admitting: *Deleted

## 2024-11-20 NOTE — Telephone Encounter (Signed)
 Called and spoke with patient verbalizing understanding. Advised patient if they will not accept AVS, then provider can request medical records for verification as to why she needs to remain on current regimen.

## 2024-11-24 ENCOUNTER — Ambulatory Visit: Admitting: Internal Medicine

## 2024-11-30 ENCOUNTER — Other Ambulatory Visit: Payer: Self-pay | Admitting: "Endocrinology

## 2024-11-30 DIAGNOSIS — E119 Type 2 diabetes mellitus without complications: Secondary | ICD-10-CM

## 2024-12-03 ENCOUNTER — Ambulatory Visit (INDEPENDENT_AMBULATORY_CARE_PROVIDER_SITE_OTHER)

## 2025-01-01 ENCOUNTER — Ambulatory Visit: Admitting: Internal Medicine

## 2025-02-27 ENCOUNTER — Ambulatory Visit: Admitting: "Endocrinology

## 2025-08-03 ENCOUNTER — Ambulatory Visit
